# Patient Record
Sex: Female | Born: 1950
Health system: Southern US, Community
[De-identification: ages and names within clinical notes are randomized; demographics above are authoritative.]

## PROBLEM LIST (undated history)

## (undated) DIAGNOSIS — M8580 Other specified disorders of bone density and structure, unspecified site: Secondary | ICD-10-CM

## (undated) DIAGNOSIS — E785 Hyperlipidemia, unspecified: Secondary | ICD-10-CM

## (undated) DIAGNOSIS — I10 Essential (primary) hypertension: Secondary | ICD-10-CM

## (undated) HISTORY — PX: OTHER SURGICAL HISTORY: SHX169

## (undated) HISTORY — DX: Other specified disorders of bone density and structure, unspecified site: M85.80

## (undated) HISTORY — DX: Hyperlipidemia, unspecified: E78.5

## (undated) HISTORY — DX: Essential (primary) hypertension: I10

---

## 1997-05-18 ENCOUNTER — Ambulatory Visit (HOSPITAL_COMMUNITY): Admission: RE | Admit: 1997-05-18 | Discharge: 1997-05-18 | Payer: Self-pay | Admitting: *Deleted

## 1997-12-04 ENCOUNTER — Other Ambulatory Visit: Admission: RE | Admit: 1997-12-04 | Discharge: 1997-12-04 | Payer: Self-pay | Admitting: *Deleted

## 1998-02-02 ENCOUNTER — Ambulatory Visit (HOSPITAL_BASED_OUTPATIENT_CLINIC_OR_DEPARTMENT_OTHER): Admission: RE | Admit: 1998-02-02 | Discharge: 1998-02-02 | Payer: Self-pay | Admitting: Orthopedic Surgery

## 1998-05-18 ENCOUNTER — Encounter: Payer: Self-pay | Admitting: *Deleted

## 1998-05-18 ENCOUNTER — Ambulatory Visit (HOSPITAL_COMMUNITY): Admission: RE | Admit: 1998-05-18 | Discharge: 1998-05-18 | Payer: Self-pay | Admitting: *Deleted

## 1998-08-05 ENCOUNTER — Encounter: Payer: Self-pay | Admitting: General Surgery

## 1998-08-05 ENCOUNTER — Ambulatory Visit (HOSPITAL_COMMUNITY): Admission: RE | Admit: 1998-08-05 | Discharge: 1998-08-05 | Payer: Self-pay | Admitting: General Surgery

## 1998-12-28 ENCOUNTER — Other Ambulatory Visit: Admission: RE | Admit: 1998-12-28 | Discharge: 1998-12-28 | Payer: Self-pay | Admitting: *Deleted

## 1999-05-20 ENCOUNTER — Ambulatory Visit (HOSPITAL_COMMUNITY): Admission: RE | Admit: 1999-05-20 | Discharge: 1999-05-20 | Payer: Self-pay | Admitting: *Deleted

## 1999-05-20 ENCOUNTER — Encounter: Payer: Self-pay | Admitting: *Deleted

## 2000-01-24 ENCOUNTER — Other Ambulatory Visit: Admission: RE | Admit: 2000-01-24 | Discharge: 2000-01-24 | Payer: Self-pay | Admitting: *Deleted

## 2000-07-11 ENCOUNTER — Encounter: Payer: Self-pay | Admitting: *Deleted

## 2000-07-11 ENCOUNTER — Ambulatory Visit (HOSPITAL_COMMUNITY): Admission: RE | Admit: 2000-07-11 | Discharge: 2000-07-11 | Payer: Self-pay | Admitting: *Deleted

## 2001-07-05 ENCOUNTER — Other Ambulatory Visit: Admission: RE | Admit: 2001-07-05 | Discharge: 2001-07-05 | Payer: Self-pay | Admitting: *Deleted

## 2002-07-30 ENCOUNTER — Other Ambulatory Visit: Admission: RE | Admit: 2002-07-30 | Discharge: 2002-07-30 | Payer: Self-pay | Admitting: *Deleted

## 2002-08-27 ENCOUNTER — Encounter: Payer: Self-pay | Admitting: Endocrinology

## 2002-08-27 ENCOUNTER — Ambulatory Visit (HOSPITAL_COMMUNITY): Admission: RE | Admit: 2002-08-27 | Discharge: 2002-08-27 | Payer: Self-pay | Admitting: Endocrinology

## 2007-11-22 ENCOUNTER — Encounter: Admission: RE | Admit: 2007-11-22 | Discharge: 2007-11-22 | Payer: Self-pay | Admitting: Family Medicine

## 2008-12-31 ENCOUNTER — Ambulatory Visit: Payer: Self-pay | Admitting: Internal Medicine

## 2009-01-18 ENCOUNTER — Ambulatory Visit: Payer: Self-pay | Admitting: Internal Medicine

## 2009-02-15 ENCOUNTER — Ambulatory Visit: Payer: Self-pay | Admitting: Internal Medicine

## 2009-04-19 ENCOUNTER — Ambulatory Visit: Payer: Self-pay | Admitting: Internal Medicine

## 2009-06-21 ENCOUNTER — Ambulatory Visit: Payer: Self-pay | Admitting: Internal Medicine

## 2009-08-02 ENCOUNTER — Ambulatory Visit: Payer: Self-pay | Admitting: Internal Medicine

## 2009-09-06 ENCOUNTER — Ambulatory Visit: Payer: Self-pay | Admitting: Internal Medicine

## 2010-01-10 ENCOUNTER — Ambulatory Visit: Payer: Self-pay | Admitting: Internal Medicine

## 2010-02-17 ENCOUNTER — Ambulatory Visit: Payer: Self-pay | Admitting: Internal Medicine

## 2010-03-20 ENCOUNTER — Encounter: Payer: Self-pay | Admitting: Family Medicine

## 2010-04-15 ENCOUNTER — Encounter (INDEPENDENT_AMBULATORY_CARE_PROVIDER_SITE_OTHER): Payer: PRIVATE HEALTH INSURANCE | Admitting: Internal Medicine

## 2010-05-30 ENCOUNTER — Ambulatory Visit (INDEPENDENT_AMBULATORY_CARE_PROVIDER_SITE_OTHER): Payer: BC Managed Care – PPO | Admitting: Internal Medicine

## 2010-05-30 DIAGNOSIS — F988 Other specified behavioral and emotional disorders with onset usually occurring in childhood and adolescence: Secondary | ICD-10-CM

## 2010-05-30 DIAGNOSIS — E039 Hypothyroidism, unspecified: Secondary | ICD-10-CM

## 2010-05-30 DIAGNOSIS — I10 Essential (primary) hypertension: Secondary | ICD-10-CM

## 2010-07-08 ENCOUNTER — Telehealth: Payer: Self-pay | Admitting: Internal Medicine

## 2010-07-08 NOTE — Telephone Encounter (Signed)
Spoke with pt regarding rash.  Pt stated she has had symptoms off and on for approximately 1 week.  She stated she would continue with topical Benadryl for comfort.  Instructed pt to go to urgent care if symptoms worsen over the weekend and could call office Monday if appointment is still needed.  Pt verbalized understanding.

## 2010-07-08 NOTE — Telephone Encounter (Signed)
Needs to come in today or go to Urgent Care

## 2010-07-08 NOTE — Telephone Encounter (Signed)
MD agreed with advice and no further instructions provided.

## 2010-09-28 ENCOUNTER — Other Ambulatory Visit: Payer: Self-pay | Admitting: Internal Medicine

## 2010-10-02 ENCOUNTER — Other Ambulatory Visit: Payer: Self-pay | Admitting: Internal Medicine

## 2010-11-25 ENCOUNTER — Encounter: Payer: Self-pay | Admitting: Internal Medicine

## 2010-11-28 ENCOUNTER — Ambulatory Visit: Payer: BC Managed Care – PPO | Admitting: Internal Medicine

## 2010-12-03 ENCOUNTER — Other Ambulatory Visit: Payer: Self-pay | Admitting: Internal Medicine

## 2010-12-05 ENCOUNTER — Ambulatory Visit (INDEPENDENT_AMBULATORY_CARE_PROVIDER_SITE_OTHER): Payer: BC Managed Care – PPO | Admitting: Internal Medicine

## 2010-12-05 ENCOUNTER — Encounter: Payer: Self-pay | Admitting: Internal Medicine

## 2010-12-05 VITALS — BP 124/88 | HR 72 | Temp 98.5°F | Ht 64.0 in | Wt 136.0 lb

## 2010-12-05 DIAGNOSIS — F988 Other specified behavioral and emotional disorders with onset usually occurring in childhood and adolescence: Secondary | ICD-10-CM | POA: Insufficient documentation

## 2010-12-05 DIAGNOSIS — J069 Acute upper respiratory infection, unspecified: Secondary | ICD-10-CM

## 2010-12-05 DIAGNOSIS — I10 Essential (primary) hypertension: Secondary | ICD-10-CM | POA: Insufficient documentation

## 2010-12-05 DIAGNOSIS — E039 Hypothyroidism, unspecified: Secondary | ICD-10-CM

## 2010-12-05 NOTE — Progress Notes (Signed)
  Subjective:    Patient ID: Alexandra Taylor, female    DOB: [birth year], 60 y.o.   MRN: [id number]  HPI 60 year old white female in today for followup on hypothyroidism, hypertension, and attention deficit disorder. Has taken Adderall for many years previously through psychiatrist Dr. Jodi Marble. Currently takes Adderall 10 mg XR once daily sometimes twice daily. This works well for her. Dose of Synthroid is 0.75 mg daily. TSH drawn and is pending. Has had recent URI symptoms with cough which is dry. No fever or chills.    Review of Systems     Objective:   Physical Exam no thyromegaly, no adenopathy; chest clear; TMs are clear pharynx is clear; neck is supple        Assessment & Plan:  URI  Hypothyroidism  Attention deficit disorder  Hypertension  Okay to refill Synthroid for 6 months if pharmacy calls assuming TSH is within normal limits. New prescription for Adderall XR 10 mg #60 with directions one by mouth twice daily for attention deficit disorder. Given total of 2 written prescriptions. In 6 months she will have physical examination. She declines influenza immunization today

## 2011-01-30 ENCOUNTER — Other Ambulatory Visit: Payer: Self-pay | Admitting: Internal Medicine

## 2011-03-01 ENCOUNTER — Telehealth: Payer: Self-pay | Admitting: Internal Medicine

## 2011-03-01 DIAGNOSIS — F988 Other specified behavioral and emotional disorders with onset usually occurring in childhood and adolescence: Secondary | ICD-10-CM

## 2011-03-01 NOTE — Telephone Encounter (Signed)
RX written for 1/3/4 and 2/3/4 for Adderall XR 10 mg #60 1 po bid.  Pt called and advised scripts were ready to be picked up.

## 2011-03-28 ENCOUNTER — Other Ambulatory Visit: Payer: Self-pay | Admitting: Internal Medicine

## 2011-05-20 ENCOUNTER — Other Ambulatory Visit: Payer: Self-pay | Admitting: Internal Medicine

## 2011-05-30 ENCOUNTER — Other Ambulatory Visit: Payer: Self-pay | Admitting: Internal Medicine

## 2011-06-02 ENCOUNTER — Other Ambulatory Visit: Payer: BC Managed Care – PPO | Admitting: Internal Medicine

## 2011-06-05 ENCOUNTER — Encounter: Payer: BC Managed Care – PPO | Admitting: Internal Medicine

## 2011-06-26 ENCOUNTER — Encounter: Payer: Self-pay | Admitting: Internal Medicine

## 2011-06-26 ENCOUNTER — Ambulatory Visit (INDEPENDENT_AMBULATORY_CARE_PROVIDER_SITE_OTHER): Payer: BC Managed Care – PPO | Admitting: Internal Medicine

## 2011-06-26 DIAGNOSIS — B029 Zoster without complications: Secondary | ICD-10-CM

## 2011-06-26 NOTE — Patient Instructions (Signed)
Take Valtrex 1 g 3 times daily for 7 days. Apply triamcinolone cream to area of dermatitis 3 times daily.

## 2011-06-26 NOTE — Progress Notes (Signed)
  Subjective:    Patient ID: Alexandra Taylor, female    DOB: [birth year], 61 y.o.   MRN: [id number]  HPI 61 year old white female with 5 day history itchy lesion right trunk. Has been doing some yard work. Patient thought perhaps this was a spider bite. She did not have any painful podrome prior to onset of rash. It is been itchy. No fever or shaking chills. No other systemic symptoms. No nausea. Patient denies pain from the lesion. Not aware that anything bit her. Patient is planning a trip to [place] soon.    Review of Systems     Objective:   Physical Exam patient has area of linear erythema surrounding an excoriated lesion. There appears to be macular erythema with some fine papules surrounding the excoriation.. There is a similar area of erythema above this main area without excoriations but with macular erythema and some fine papules. These 2 areas follow a definite dermatatome along right lateral back into axillary line in the parathoracic area.        Assessment & Plan:  Probable Herpes zoster  Plan: Triamcinolone cream 0.1% 60 g to use on lesions 3 times a day for itching. Valtrex 1 g tablets (#21) 1 by mouth 3 times a day for 7 days. Even though she has had symptoms longer than 48 hours, I'm going to try Valtrex.

## 2011-06-27 ENCOUNTER — Ambulatory Visit: Payer: BC Managed Care – PPO | Admitting: Internal Medicine

## 2011-07-09 ENCOUNTER — Other Ambulatory Visit: Payer: Self-pay | Admitting: Internal Medicine

## 2011-07-31 ENCOUNTER — Other Ambulatory Visit: Payer: BC Managed Care – PPO | Admitting: Internal Medicine

## 2011-07-31 DIAGNOSIS — Z0000 Encounter for general adult medical examination without abnormal findings: Secondary | ICD-10-CM

## 2011-07-31 LAB — CBC WITH DIFFERENTIAL/PLATELET
Basophils Absolute: 0 10*3/uL (ref 0.0–0.1)
Basophils Relative: 1 % (ref 0–1)
Eosinophils Absolute: 0.2 10*3/uL (ref 0.0–0.7)
Eosinophils Relative: 4 % (ref 0–5)
MCH: 32.1 pg (ref 26.0–34.0)
MCHC: 34.8 g/dL (ref 30.0–36.0)
MCV: 92.4 fL (ref 78.0–100.0)
Neutrophils Relative %: 44 % (ref 43–77)
Platelets: 183 10*3/uL (ref 150–400)
RBC: 4.45 MIL/uL (ref 3.87–5.11)
RDW: 14.4 % (ref 11.5–15.5)

## 2011-07-31 LAB — LIPID PANEL
Cholesterol: 208 mg/dL — ABNORMAL HIGH (ref 0–200)
LDL Cholesterol: 72 mg/dL (ref 0–99)
Total CHOL/HDL Ratio: 1.6 Ratio
VLDL: 8 mg/dL (ref 0–40)

## 2011-07-31 LAB — COMPREHENSIVE METABOLIC PANEL
ALT: 15 U/L (ref 0–35)
AST: 27 U/L (ref 0–37)
Alkaline Phosphatase: 99 U/L (ref 39–117)
CO2: 28 mEq/L (ref 19–32)
Creat: 0.91 mg/dL (ref 0.50–1.10)
Sodium: 142 mEq/L (ref 135–145)
Total Bilirubin: 0.7 mg/dL (ref 0.3–1.2)
Total Protein: 6.7 g/dL (ref 6.0–8.3)

## 2011-08-01 ENCOUNTER — Encounter: Payer: Self-pay | Admitting: Internal Medicine

## 2011-08-01 ENCOUNTER — Ambulatory Visit (INDEPENDENT_AMBULATORY_CARE_PROVIDER_SITE_OTHER): Payer: BC Managed Care – PPO | Admitting: Internal Medicine

## 2011-08-01 VITALS — BP 130/74 | HR 72 | Ht 64.0 in | Wt 146.0 lb

## 2011-08-01 DIAGNOSIS — E039 Hypothyroidism, unspecified: Secondary | ICD-10-CM

## 2011-08-01 DIAGNOSIS — F988 Other specified behavioral and emotional disorders with onset usually occurring in childhood and adolescence: Secondary | ICD-10-CM

## 2011-08-01 DIAGNOSIS — I10 Essential (primary) hypertension: Secondary | ICD-10-CM

## 2011-08-01 LAB — POCT URINALYSIS DIPSTICK
Bilirubin, UA: NEGATIVE
Blood, UA: NEGATIVE
Nitrite, UA: NEGATIVE
Protein, UA: NEGATIVE
pH, UA: 6.5

## 2011-08-01 LAB — VITAMIN D 25 HYDROXY (VIT D DEFICIENCY, FRACTURES): Vit D, 25-Hydroxy: 46 ng/mL (ref 30–89)

## 2011-10-30 ENCOUNTER — Encounter: Payer: Self-pay | Admitting: Internal Medicine

## 2011-10-30 NOTE — Patient Instructions (Addendum)
Continue same medications and return in 6 months 

## 2011-10-30 NOTE — Progress Notes (Signed)
  Subjective:    Patient ID: Alexandra Taylor, female    DOB: 04-24-1950, 61 y.o.   MRN: [id number]  HPI 61 year old white female with history of hypertension, hypothyroidism, osteopenia and attention deficit disorder. Formerly seen by Dr. Jodi Marble treated with Adderall and Prozac. We started her on antihypertensive medication in November 2010. She was diagnosed with hyperthyroidism in 2004 and underwent a hemithyroidectomy resulting in hypothyroidism. GYN physician is Dr. Edward Jolly.  Bone density study in 2009. She currently takes Synthroid 0.075 mg daily along with vitamin D and calcium supplementation. Had colonoscopy 01/07/2007. Tetanus immunization November 2010.  Family history: Father died at age 89 with history of dementia. Mother living with history of hypertension, stroke and heart attack.. 7 brothers and 3 sisters. Social history: Patient is married enjoys sculpture and painting. Does not smoke. Social alcohol consumption daily consisting of wine and beer. Patient works as a Solicitor at [employer]. Husband is a self-employed Acupuncturist.    Review of Systems  Constitutional: Negative.   All other systems reviewed and are negative.       Objective:   Physical Exam  Constitutional: She is oriented to person, place, and time. She appears well-developed and well-nourished. No distress.  HENT:  Head: Normocephalic and atraumatic.  Right Ear: External ear normal.  Left Ear: External ear normal.  Mouth/Throat: Oropharynx is clear and moist. No oropharyngeal exudate.  Eyes: Conjunctivae and EOM are normal. Pupils are equal, round, and reactive to light. Right eye exhibits no discharge. Left eye exhibits no discharge. No scleral icterus.  Neck: Neck supple. No JVD present. No thyromegaly present.  Cardiovascular: Normal rate, regular rhythm, normal heart sounds and intact distal pulses.   No murmur heard. Pulmonary/Chest: Effort normal and breath sounds normal. No  respiratory distress. She has no wheezes. She has no rales.  Abdominal: Soft. Bowel sounds are normal. She exhibits no distension and no mass. There is no tenderness. There is no rebound and no guarding.  Genitourinary:       Deferred to GYN deferred to GYN  Musculoskeletal: She exhibits no edema.  Lymphadenopathy:    She has no cervical adenopathy.  Neurological: She is alert and oriented to person, place, and time. She has normal reflexes. No cranial nerve deficit. Coordination normal.  Skin: Skin is warm and dry. No rash noted. She is not diaphoretic.  Psychiatric: She has a normal mood and affect. Her behavior is normal. Judgment and thought content normal.          Assessment & Plan:  Hypertension  Hypothyroidism status post him he thyroidectomy for hyperthyroidism 2004  Attention deficit disorder treated with Prozac and Adderall  Plan: 3 written prescriptions for Adderall XR 10 mg daily #30 tablets he should dated for the next 3 months. Return in 6 months for office visit, followup on attention deficit,blood pressure check and  TSH

## 2012-01-23 ENCOUNTER — Other Ambulatory Visit: Payer: BC Managed Care – PPO | Admitting: Internal Medicine

## 2012-01-23 DIAGNOSIS — E039 Hypothyroidism, unspecified: Secondary | ICD-10-CM

## 2012-01-29 ENCOUNTER — Ambulatory Visit (INDEPENDENT_AMBULATORY_CARE_PROVIDER_SITE_OTHER): Payer: BC Managed Care – PPO | Admitting: Internal Medicine

## 2012-01-29 ENCOUNTER — Encounter: Payer: Self-pay | Admitting: Internal Medicine

## 2012-01-29 VITALS — BP 130/78 | HR 84 | Temp 98.9°F | Wt 145.5 lb

## 2012-01-29 DIAGNOSIS — Z23 Encounter for immunization: Secondary | ICD-10-CM

## 2012-01-29 DIAGNOSIS — F341 Dysthymic disorder: Secondary | ICD-10-CM

## 2012-01-29 DIAGNOSIS — F419 Anxiety disorder, unspecified: Secondary | ICD-10-CM

## 2012-01-29 DIAGNOSIS — E039 Hypothyroidism, unspecified: Secondary | ICD-10-CM

## 2012-01-29 DIAGNOSIS — I10 Essential (primary) hypertension: Secondary | ICD-10-CM

## 2012-01-29 DIAGNOSIS — F329 Major depressive disorder, single episode, unspecified: Secondary | ICD-10-CM

## 2012-01-29 NOTE — Progress Notes (Signed)
  Subjective:    Patient ID: Alexandra Taylor, female    DOB: [birth year], 61 y.o.   MRN: [id number]  HPI 61 year old white female in today for six-month recheck on hypertension and hypothyroidism. TSH recently drawn is within normal limits on current dose of Synthroid. Patient no longer taking Adderall. Feels she doesn't need it for attention deficit issues previously diagnosed by psychiatrist, Dr. Jodi Marble. Dr. Jodi Marble also had her on Prozac for a number of years. She tried stopping that abruptly recently and had significant discontinuation symptoms. Says she thinks she might like to get off of Prozac. Blood pressure is under good control on current regimen. No changes will be made with that medication.    Review of Systems     Objective:   Physical Exam Skin is warm and dry. Neck is supple without thyromegaly JVD or carotid bruits. Chest clear to auscultation. Cardiac exam regular rate and rhythm normal S1 and S2. Extremities without edema. Affect is normal.        Assessment & Plan:  Anxiety depression-treated for many years on Prozac  History of attention deficit disorder-patient has stopped Adderall and is doing well  Hypertension-stable on antihypertensive therapy- no change in regimen  Hypothyroidism-TSH normal continue same dose of Synthroid  Plan: If patient decides to discontinue Prozac she should do so slowly over several weeks. Spoke with her about tapering regimen today should she so desire to do this. Explained to her there was no harm in continuing Prozac since she had been on it for some time but she felt it helped her mood.

## 2012-01-29 NOTE — Patient Instructions (Addendum)
Continue thyroid replacement and antihypertensive therapy as previously prescribed. If you wish to stop Prozac he will need to taper it as directed over several weeks. Flu vaccine has been given today.

## 2012-02-08 ENCOUNTER — Other Ambulatory Visit: Payer: Self-pay | Admitting: Internal Medicine

## 2012-02-13 ENCOUNTER — Other Ambulatory Visit: Payer: Self-pay | Admitting: Internal Medicine

## 2012-02-14 ENCOUNTER — Other Ambulatory Visit: Payer: Self-pay

## 2012-03-13 ENCOUNTER — Other Ambulatory Visit: Payer: Self-pay

## 2012-03-13 MED ORDER — FLUOXETINE HCL 20 MG PO CAPS: 20.0000 mg | ORAL_CAPSULE | Freq: Every day | ORAL | Status: DC

## 2012-04-23 ENCOUNTER — Encounter: Payer: Self-pay | Admitting: Internal Medicine

## 2012-04-23 ENCOUNTER — Ambulatory Visit (INDEPENDENT_AMBULATORY_CARE_PROVIDER_SITE_OTHER): Payer: PRIVATE HEALTH INSURANCE | Admitting: Internal Medicine

## 2012-04-23 VITALS — BP 130/74 | Temp 99.5°F | Wt 147.0 lb

## 2012-04-23 DIAGNOSIS — J019 Acute sinusitis, unspecified: Secondary | ICD-10-CM

## 2012-04-23 DIAGNOSIS — R51 Headache: Secondary | ICD-10-CM

## 2012-04-23 NOTE — Patient Instructions (Addendum)
Take Levaquin 500 milligrams daily for 7 days for sinusitis. Take half of a hydrocodone tablet for pain with food. Call if not better in 48-72 hours or sooner if 4.

## 2012-04-23 NOTE — Progress Notes (Signed)
  Subjective:    Patient ID: Alexandra Taylor, female    DOB: 03/13/1950, 62 y.o.   MRN: [id number]  HPI 62 year old white female who recently had 4 wisdom teeth extracted on February 12. Shortly thereafter she came down with an upper respiratory infection which has been slow to resolve. Seems to be over the multiple tooth extractions. However today had onset of left maxillary pain and headache in the left periorbital area. Feels stuffy in her nose. No significant fever or shaking chills. No myalgias. No vomiting. Has remote history of migraine headache. Feels that this may be a sinus infection.    Review of Systems     Objective:   Physical Exam she is alert and oriented x3. Skin is warm and dry. PERRLA. Fundi are benign. Pharynx is slightly injected. TMs are clear. Neck is supple without significant adenopathy or thyromegaly. Chest clear to auscultation. No gross focal deficits on brief neurological exam        Assessment & Plan:  Sinusitis  Possible migraine headache  Plan: Patient has some hydrocodone at home left over from wisdom tooth extractions. She says it makes her "goofy". Suggested she take half tablet every 8 hours as needed for pain. Levaquin 500 milligrams daily for 7 days with a meal. Call if not better in 48 hours or sooner if worse.

## 2012-05-01 ENCOUNTER — Other Ambulatory Visit: Payer: Self-pay

## 2012-05-01 MED ORDER — FLUOXETINE HCL 20 MG PO CAPS: 20.0000 mg | ORAL_CAPSULE | Freq: Every day | ORAL | Status: DC

## 2012-05-01 MED ORDER — RAMIPRIL 10 MG PO CAPS: 10.0000 mg | ORAL_CAPSULE | Freq: Every day | ORAL | Status: DC

## 2012-05-02 ENCOUNTER — Other Ambulatory Visit: Payer: Self-pay

## 2012-05-02 MED ORDER — FLUOXETINE HCL 20 MG PO CAPS: 20.0000 mg | ORAL_CAPSULE | Freq: Every day | ORAL | Status: DC

## 2012-06-18 ENCOUNTER — Other Ambulatory Visit: Payer: Self-pay

## 2012-06-18 MED ORDER — SYNTHROID 75 MCG PO TABS: 75.0000 ug | ORAL_TABLET | Freq: Every day | ORAL | Status: DC

## 2012-08-06 ENCOUNTER — Other Ambulatory Visit: Payer: BC Managed Care – PPO | Admitting: Internal Medicine

## 2012-08-08 ENCOUNTER — Encounter: Payer: BC Managed Care – PPO | Admitting: Internal Medicine

## 2012-08-19 ENCOUNTER — Other Ambulatory Visit: Payer: PRIVATE HEALTH INSURANCE | Admitting: Internal Medicine

## 2012-08-19 DIAGNOSIS — Z13220 Encounter for screening for lipoid disorders: Secondary | ICD-10-CM

## 2012-08-19 DIAGNOSIS — Z130 Encounter for screening for diseases of the blood and blood-forming organs and certain disorders involving the immune mechanism: Secondary | ICD-10-CM

## 2012-08-19 DIAGNOSIS — Z13228 Encounter for screening for other metabolic disorders: Secondary | ICD-10-CM

## 2012-08-19 DIAGNOSIS — I10 Essential (primary) hypertension: Secondary | ICD-10-CM

## 2012-08-19 DIAGNOSIS — E039 Hypothyroidism, unspecified: Secondary | ICD-10-CM

## 2012-08-19 LAB — LIPID PANEL
HDL: 125 mg/dL (ref >39)
Triglycerides: 42 mg/dL (ref <150)

## 2012-08-19 LAB — CBC WITH DIFFERENTIAL/PLATELET
HCT: 39.8 % (ref 36.0–46.0)
Hemoglobin: 13.6 g/dL (ref 12.0–15.0)
Lymphocytes Relative: 45 % (ref 12–46)
MCHC: 34.2 g/dL (ref 30.0–36.0)
Monocytes Absolute: 0.4 10*3/uL (ref 0.1–1.0)
Monocytes Relative: 11 % (ref 3–12)
Neutro Abs: 1.5 10*3/uL — ABNORMAL LOW (ref 1.7–7.7)
WBC: 3.9 10*3/uL — ABNORMAL LOW (ref 4.0–10.5)

## 2012-08-19 LAB — COMPREHENSIVE METABOLIC PANEL
Albumin: 4.1 g/dL (ref 3.5–5.2)
BUN: 17 mg/dL (ref 6–23)
Calcium: 9.4 mg/dL (ref 8.4–10.5)
Chloride: 101 mEq/L (ref 96–112)
Glucose, Bld: 75 mg/dL (ref 70–99)
Potassium: 4.8 mEq/L (ref 3.5–5.3)

## 2012-08-20 ENCOUNTER — Other Ambulatory Visit (HOSPITAL_COMMUNITY)
Admission: RE | Admit: 2012-08-20 | Discharge: 2012-08-20 | Disposition: A | Discharge status: Home / Self Care | Payer: PRIVATE HEALTH INSURANCE | Source: Ambulatory Visit | Attending: Internal Medicine | Admitting: Internal Medicine

## 2012-08-20 ENCOUNTER — Encounter: Payer: Self-pay | Admitting: Internal Medicine

## 2012-08-20 ENCOUNTER — Ambulatory Visit (INDEPENDENT_AMBULATORY_CARE_PROVIDER_SITE_OTHER): Payer: PRIVATE HEALTH INSURANCE | Admitting: Internal Medicine

## 2012-08-20 VITALS — BP 134/84 | HR 80 | Ht 64.0 in | Wt 146.0 lb

## 2012-08-20 DIAGNOSIS — E039 Hypothyroidism, unspecified: Secondary | ICD-10-CM

## 2012-08-20 DIAGNOSIS — M8580 Other specified disorders of bone density and structure, unspecified site: Secondary | ICD-10-CM

## 2012-08-20 DIAGNOSIS — F411 Generalized anxiety disorder: Secondary | ICD-10-CM

## 2012-08-20 DIAGNOSIS — Z01419 Encounter for gynecological examination (general) (routine) without abnormal findings: Secondary | ICD-10-CM | POA: Insufficient documentation

## 2012-08-20 DIAGNOSIS — M899 Disorder of bone, unspecified: Secondary | ICD-10-CM

## 2012-08-20 DIAGNOSIS — Z0000 Encounter for general adult medical examination without abnormal findings: Secondary | ICD-10-CM

## 2012-08-20 DIAGNOSIS — F988 Other specified behavioral and emotional disorders with onset usually occurring in childhood and adolescence: Secondary | ICD-10-CM

## 2012-08-20 DIAGNOSIS — I10 Essential (primary) hypertension: Secondary | ICD-10-CM

## 2012-08-20 MED ORDER — FLUOXETINE HCL 40 MG PO CAPS: 40.0000 mg | ORAL_CAPSULE | Freq: Every day | ORAL | Status: DC

## 2012-08-20 MED ORDER — SYNTHROID 75 MCG PO TABS: 75.0000 ug | ORAL_TABLET | Freq: Every day | ORAL | Status: DC

## 2012-08-20 MED ORDER — RAMIPRIL 10 MG PO CAPS: 10.0000 mg | ORAL_CAPSULE | Freq: Every day | ORAL | Status: DC

## 2012-08-29 ENCOUNTER — Other Ambulatory Visit: Payer: Self-pay | Admitting: Internal Medicine

## 2012-09-08 ENCOUNTER — Encounter: Payer: Self-pay | Admitting: Internal Medicine

## 2012-12-23 ENCOUNTER — Other Ambulatory Visit: Payer: PRIVATE HEALTH INSURANCE | Admitting: Internal Medicine

## 2012-12-23 DIAGNOSIS — E039 Hypothyroidism, unspecified: Secondary | ICD-10-CM

## 2012-12-24 ENCOUNTER — Ambulatory Visit (INDEPENDENT_AMBULATORY_CARE_PROVIDER_SITE_OTHER): Payer: PRIVATE HEALTH INSURANCE | Admitting: Internal Medicine

## 2012-12-24 ENCOUNTER — Encounter: Payer: Self-pay | Admitting: Internal Medicine

## 2012-12-24 VITALS — BP 124/78 | HR 68 | Temp 97.9°F | Ht 64.0 in | Wt 143.0 lb

## 2012-12-24 DIAGNOSIS — F329 Major depressive disorder, single episode, unspecified: Secondary | ICD-10-CM

## 2012-12-24 DIAGNOSIS — F341 Dysthymic disorder: Secondary | ICD-10-CM

## 2012-12-24 DIAGNOSIS — F32A Depression, unspecified: Secondary | ICD-10-CM

## 2012-12-24 DIAGNOSIS — E039 Hypothyroidism, unspecified: Secondary | ICD-10-CM

## 2012-12-24 DIAGNOSIS — Z23 Encounter for immunization: Secondary | ICD-10-CM

## 2012-12-24 DIAGNOSIS — F419 Anxiety disorder, unspecified: Secondary | ICD-10-CM

## 2012-12-24 DIAGNOSIS — I10 Essential (primary) hypertension: Secondary | ICD-10-CM

## 2012-12-24 DIAGNOSIS — Z0000 Encounter for general adult medical examination without abnormal findings: Secondary | ICD-10-CM

## 2012-12-24 MED ORDER — LEVOTHYROXINE SODIUM 50 MCG PO TABS: 50.0000 ug | ORAL_TABLET | Freq: Every day | ORAL | Status: DC

## 2012-12-24 NOTE — Patient Instructions (Addendum)
Alternate Synthroid to 0.05 mg every other day with 0.075 mg Synthroid every other day until dosage of 0.075 has been depleted. Then begin Synthroid 0.05 mg daily until rechecked early July 2015. Continuing antihypertensive medication and Prozac. Flu vaccine given today.

## 2012-12-24 NOTE — Addendum Note (Signed)
Addended by: Fayne Mediate on: 12/24/2012 12:54 PM   Modules accepted: Orders

## 2012-12-24 NOTE — Progress Notes (Signed)
  Subjective:    Patient ID: Alexandra Taylor, female    DOB: 04-May-1950, 62 y.o.   MRN: [id number]  HPI in today for six-month recheck of hypothyroidism. TSH is low. It was low the last time she was here is well. She currently is on Synthroid 0.075 mg daily non-generic. She has a history of hypertension and is on Ramipril 10 mg daily. History of anxiety depression and has taken Prozac for a long time. Seems to be doing well. Feels okay. Influenza vaccine given today.    Review of Systems     Objective:   Physical Exam neck is supple without thyromegaly. Chest clear to auscultation. Cardiac exam regular rate and rhythm. Extremities without edema. Skin is warm and dry. Affect is normal.        Assessment & Plan:  History of anxiety depression-stable on Prozac  Hypertension-stable on Ramipril 10 mg daily  Hypothyroidism-TSH indicates that dosage of thyroid replacement needs to be reduced. Patient has just purchased 90 days of Synthroid 0.075 mg daily. She will alternate that with 0.05 mg daily for the next 90 days and then stay entirely on Synthroid 0.05 mg daily until she returns late June or early July for physical examination.  Flu vaccine given today.

## 2013-01-25 ENCOUNTER — Encounter: Payer: Self-pay | Admitting: Internal Medicine

## 2013-01-25 NOTE — Progress Notes (Signed)
   Subjective:    Patient ID: Alexandra Taylor, female    DOB: 06/11/1950, 63 y.o.   MRN: [id number]  HPI 62 year old white female in today for health maintenance and evaluation of medical issues. She has a history of hypothyroidism. History of hypertension, osteopenia, and attention deficit disorder. Formerly seen by Dr. Daisy Floro we treated with Adderall and Prozac. She was started here on antihypertensive medication November 2010. Was diagnosed with hyperthyroidism in 2004 and underwent a hemithyroidectomy resulting in hypothyroidism. GYN physician moved away. She would like to get Pap smear and pelvic exam here today. Had colonoscopy in 2008. Tetanus immunization 2010.  Social history: Patient is married enjoys sculpturing paining. Does not smoke. Social alcohol consumption daily consisting of beer and wine. Patient works as a Solicitor at [employer]. Husband is a self-employed Acupuncturist.  Family history: Father died at age 34 with history of dementia. Mother living with history of hypertension, stroke, heart attack. 7 brothers and 3 sisters.    Review of Systems  Constitutional: Negative.   All other systems reviewed and are negative.       Objective:   Physical Exam  Vitals reviewed. Constitutional: She is oriented to person, place, and time. She appears well-developed and well-nourished. No distress.  HENT:  Head: Normocephalic and atraumatic.  Right Ear: External ear normal.  Left Ear: External ear normal.  Mouth/Throat: Oropharynx is clear and moist. No oropharyngeal exudate.  Eyes: Right eye exhibits no discharge. Left eye exhibits no discharge.  Neck: Neck supple. No JVD present. No thyromegaly present.  Cardiovascular: Normal rate, regular rhythm and normal heart sounds.   No murmur heard. Pulmonary/Chest: Effort normal and breath sounds normal. No respiratory distress. She has no wheezes. She has no rales.  Abdominal: Soft. Bowel sounds are normal. She  exhibits no distension and no mass. There is no tenderness. There is no rebound and no guarding.  Genitourinary:  Pap taken. Bimanual normal.  Musculoskeletal: Normal range of motion. She exhibits no edema.  Lymphadenopathy:    She has no cervical adenopathy.  Neurological: She is alert and oriented to person, place, and time. She has normal reflexes. Coordination normal.  Skin: Skin is warm and dry. No rash noted. She is not diaphoretic.  Psychiatric: She has a normal mood and affect. Her behavior is normal. Judgment and thought content normal.          Assessment & Plan:  Hypertension-stable  Surgical hypothyroidism status post hemithyroidectomy in 2004-TSH low on Synthroid. Could be an error. Recheck in 6-8 weeks.  Osteopenia-recommend bone density study  History of attention deficit disorder- No longer takes Adderall.  Anxiety-treated with Prozac 40 mg daily  Plan: Return in 6 months or as needed. Recommend annual flu vaccine. Recommend annual mammogram. Bone density study every 3 years.

## 2013-01-26 NOTE — Patient Instructions (Signed)
Recheck TSH in 6 weeks. Otherwise return in 6 months.

## 2013-01-29 ENCOUNTER — Other Ambulatory Visit: Payer: Self-pay | Admitting: Internal Medicine

## 2013-04-23 ENCOUNTER — Other Ambulatory Visit: Payer: Self-pay | Admitting: Internal Medicine

## 2013-06-30 ENCOUNTER — Other Ambulatory Visit: Payer: Self-pay | Admitting: Internal Medicine

## 2013-08-21 ENCOUNTER — Other Ambulatory Visit: Payer: PRIVATE HEALTH INSURANCE | Admitting: Internal Medicine

## 2013-08-21 ENCOUNTER — Other Ambulatory Visit: Payer: Self-pay | Admitting: Internal Medicine

## 2013-08-21 DIAGNOSIS — Z13220 Encounter for screening for lipoid disorders: Secondary | ICD-10-CM

## 2013-08-21 DIAGNOSIS — Z1329 Encounter for screening for other suspected endocrine disorder: Secondary | ICD-10-CM

## 2013-08-21 DIAGNOSIS — Z130 Encounter for screening for diseases of the blood and blood-forming organs and certain disorders involving the immune mechanism: Secondary | ICD-10-CM

## 2013-08-21 DIAGNOSIS — E039 Hypothyroidism, unspecified: Secondary | ICD-10-CM

## 2013-08-21 DIAGNOSIS — I10 Essential (primary) hypertension: Secondary | ICD-10-CM

## 2013-08-21 DIAGNOSIS — Z13228 Encounter for screening for other metabolic disorders: Secondary | ICD-10-CM

## 2013-08-21 LAB — LIPID PANEL
Cholesterol: 222 mg/dL — ABNORMAL HIGH (ref 0–200)
HDL: 126 mg/dL (ref >39)
LDL Cholesterol: 87 mg/dL (ref 0–99)
TRIGLYCERIDES: 45 mg/dL (ref <150)
Total CHOL/HDL Ratio: 1.8 Ratio
VLDL: 9 mg/dL (ref 0–40)

## 2013-08-21 LAB — CBC WITH DIFFERENTIAL/PLATELET
BASOS ABS: 0 10*3/uL (ref 0.0–0.1)
BASOS PCT: 1 % (ref 0–1)
EOS PCT: 4 % (ref 0–5)
Eosinophils Absolute: 0.1 10*3/uL (ref 0.0–0.7)
HCT: 38.3 % (ref 36.0–46.0)
Hemoglobin: 12.9 g/dL (ref 12.0–15.0)
LYMPHS PCT: 45 % (ref 12–46)
Lymphs Abs: 1.2 10*3/uL (ref 0.7–4.0)
MCH: 32.3 pg (ref 26.0–34.0)
MCHC: 33.7 g/dL (ref 30.0–36.0)
MCV: 95.8 fL (ref 78.0–100.0)
Monocytes Absolute: 0.3 10*3/uL (ref 0.1–1.0)
Monocytes Relative: 10 % (ref 3–12)
Neutro Abs: 1.1 10*3/uL — ABNORMAL LOW (ref 1.7–7.7)
Neutrophils Relative %: 40 % — ABNORMAL LOW (ref 43–77)
PLATELETS: 168 10*3/uL (ref 150–400)
RBC: 4 MIL/uL (ref 3.87–5.11)
RDW: 14.4 % (ref 11.5–15.5)
WBC: 2.7 10*3/uL — AB (ref 4.0–10.5)

## 2013-08-21 LAB — COMPREHENSIVE METABOLIC PANEL
ALBUMIN: 4.2 g/dL (ref 3.5–5.2)
ALT: 12 U/L (ref 0–35)
AST: 25 U/L (ref 0–37)
Alkaline Phosphatase: 88 U/L (ref 39–117)
BILIRUBIN TOTAL: 0.4 mg/dL (ref 0.2–1.2)
BUN: 12 mg/dL (ref 6–23)
CO2: 27 mEq/L (ref 19–32)
Calcium: 9.1 mg/dL (ref 8.4–10.5)
Chloride: 105 mEq/L (ref 96–112)
Creat: 0.84 mg/dL (ref 0.50–1.10)
GLUCOSE: 75 mg/dL (ref 70–99)
POTASSIUM: 5.1 meq/L (ref 3.5–5.3)
Sodium: 142 mEq/L (ref 135–145)
Total Protein: 6.6 g/dL (ref 6.0–8.3)

## 2013-08-22 ENCOUNTER — Ambulatory Visit (INDEPENDENT_AMBULATORY_CARE_PROVIDER_SITE_OTHER): Payer: PRIVATE HEALTH INSURANCE | Admitting: Internal Medicine

## 2013-08-22 ENCOUNTER — Encounter: Payer: Self-pay | Admitting: Internal Medicine

## 2013-08-22 VITALS — BP 120/68 | HR 64 | Temp 98.5°F | Ht 64.0 in | Wt 146.0 lb

## 2013-08-22 DIAGNOSIS — I10 Essential (primary) hypertension: Secondary | ICD-10-CM

## 2013-08-22 DIAGNOSIS — M899 Disorder of bone, unspecified: Secondary | ICD-10-CM

## 2013-08-22 DIAGNOSIS — M949 Disorder of cartilage, unspecified: Secondary | ICD-10-CM

## 2013-08-22 DIAGNOSIS — M79609 Pain in unspecified limb: Secondary | ICD-10-CM

## 2013-08-22 DIAGNOSIS — F988 Other specified behavioral and emotional disorders with onset usually occurring in childhood and adolescence: Secondary | ICD-10-CM

## 2013-08-22 DIAGNOSIS — W57XXXA Bitten or stung by nonvenomous insect and other nonvenomous arthropods, initial encounter: Secondary | ICD-10-CM

## 2013-08-22 DIAGNOSIS — Z1239 Encounter for other screening for malignant neoplasm of breast: Secondary | ICD-10-CM

## 2013-08-22 DIAGNOSIS — M79644 Pain in right finger(s): Secondary | ICD-10-CM

## 2013-08-22 DIAGNOSIS — Z8739 Personal history of other diseases of the musculoskeletal system and connective tissue: Secondary | ICD-10-CM

## 2013-08-22 DIAGNOSIS — E039 Hypothyroidism, unspecified: Secondary | ICD-10-CM

## 2013-08-22 DIAGNOSIS — D709 Neutropenia, unspecified: Secondary | ICD-10-CM

## 2013-08-22 DIAGNOSIS — Z0000 Encounter for general adult medical examination without abnormal findings: Secondary | ICD-10-CM

## 2013-08-22 DIAGNOSIS — F341 Dysthymic disorder: Secondary | ICD-10-CM

## 2013-08-22 DIAGNOSIS — M8580 Other specified disorders of bone density and structure, unspecified site: Secondary | ICD-10-CM

## 2013-08-22 LAB — URIC ACID: URIC ACID, SERUM: 5.1 mg/dL (ref 2.4–7.0)

## 2013-08-22 LAB — VITAMIN D 25 HYDROXY (VIT D DEFICIENCY, FRACTURES): Vit D, 25-Hydroxy: 48 ng/mL (ref 30–89)

## 2013-08-22 LAB — SEDIMENTATION RATE: SED RATE: 5 mm/h (ref 0–22)

## 2013-08-22 LAB — TSH: TSH: 1.048 u[IU]/mL (ref 0.350–4.500)

## 2013-08-23 NOTE — Patient Instructions (Addendum)
We will obtain rheumatology consultation regarding your hand issues. Continue same medications and return in 3 months for repeat CBC since white blood cell count is low. Continue thyroid replacement. Continue Prozac. Please have mammogram and bone density study.

## 2013-08-23 NOTE — Progress Notes (Signed)
Subjective:    Patient ID: Alexandra Taylor, female    DOB: 07-Nov-1950, 63 y.o.   MRN: [id number]  HPI 63 year old White Female in today for health maintenance and evaluation of medical issues. Having issues with right index and third finger right hand with stiffness and distortion of joints. Says father had similar problems. She has a history of hypothyroidism, hypertension, osteopenia, attention deficit disorder. Currently not on attention deficit medication. She was diagnosed with hyperthyroidism in 2004 and underwent a him he thyroidectomy resulting in hypothyroidism. Was started on antihypertensive medication November 2010. Has taken Adderall in the past for attention deficit. Is on Prozac for dysthymic disorder/depression.  Colonoscopy in 2008. Tetanus immunization 2010.  Social history: Patient is married and enjoys sculpturing and painting. She does not smoke. Social alcohol consumption consisting of beer and wine. She works as a Scientist, clinical (histocompatibility and immunogenetics) at [employer]. Husband is a self-employed Chartered certified accountant.  Family history: Mother in her 15s with history of hypertension, stroke, MI, fractured hip and recent diagnosis of anemia. Father died at age 51 with history of dementia and had arthritis of his hands.    Review of Systems  HENT: Negative.   Respiratory: Negative.   Cardiovascular: Negative.   Gastrointestinal: Negative.   Musculoskeletal:       Some stiffness right hand second and third fingers       Objective:   Physical Exam  Vitals reviewed. Constitutional: She is oriented to person, place, and time. She appears well-developed and well-nourished. No distress.  HENT:  Head: Normocephalic and atraumatic.  Right Ear: External ear normal.  Left Ear: External ear normal.  Mouth/Throat: Oropharynx is clear and moist. No oropharyngeal exudate.  Eyes: Conjunctivae and EOM are normal. Pupils are equal, round, and reactive to light. Right eye exhibits no discharge. Left  eye exhibits no discharge. No scleral icterus.  Neck: Neck supple. No JVD present.  Cardiovascular: Normal rate, regular rhythm, normal heart sounds and intact distal pulses.   No murmur heard. Pulmonary/Chest: Effort normal and breath sounds normal. No respiratory distress. She has no wheezes. She has no rales. She exhibits no tenderness.  Breasts normal female  Abdominal: Soft. Bowel sounds are normal. She exhibits no distension and no mass. There is no tenderness. There is no rebound and no guarding.  Genitourinary:  Deferred  Musculoskeletal: She exhibits no edema.  Have her teens and Bouchard's nodes right second and third fingers. No redness increased warmth or tenderness of these joints  Lymphadenopathy:    She has no cervical adenopathy.  Neurological: She is alert and oriented to person, place, and time. She has normal reflexes. No cranial nerve deficit. Coordination normal.  Skin: Skin is warm and dry. No rash noted. She is not diaphoretic.  4 resolving insect bites left upper shoulder  Psychiatric: She has a normal mood and affect. Her behavior is normal. Judgment and thought content normal.          Assessment & Plan:  Hypothyroidism-TSH is stable on thyroid replacement  Neutropenia-new diagnosis. White blood cell count is decreased. Return in 3 months for repeat CBC  Hypertension-stable on ACE inhibitor.  History of attention deficit- no longer takes Adderall  History of dysthymic disorder-continues to take Prozac  Heberden's and Bouchard's nodes right hand with significant distortion. Consider gout or rheumatoid arthritis. Have added uric acid, sed rate, CCP to present labs. Rheumatology consultation.  New insect bites left upper shoulder x4. Patient to apply over-the-counter cortisone 1% cream 3 times  daily  Recommend annual mammogram. Order placed in Epic. She's not had a recent mammogram and was encouraged to do so.History of osteopenia. Should have bone density  study also. Return in one year or as needed. Continue to monitor blood pressure and time.

## 2013-08-25 LAB — CYCLIC CITRUL PEPTIDE ANTIBODY, IGG: Cyclic Citrullin Peptide Ab: <2 U/mL (ref 0.0–5.0)

## 2013-08-25 NOTE — Progress Notes (Signed)
Patient informed. 

## 2013-08-26 ENCOUNTER — Telehealth: Payer: Self-pay

## 2013-08-28 ENCOUNTER — Telehealth: Payer: Self-pay | Admitting: Internal Medicine

## 2013-08-28 ENCOUNTER — Other Ambulatory Visit: Payer: Self-pay

## 2013-08-28 MED ORDER — TRIAMCINOLONE ACETONIDE 0.1 % EX CREA: 1.0000 "application " | TOPICAL_CREAM | Freq: Three times a day (TID) | CUTANEOUS | Status: DC

## 2013-08-28 NOTE — Telephone Encounter (Signed)
Patient informed. 

## 2013-08-28 NOTE — Telephone Encounter (Signed)
She declined meds at time of visit for insect stings. Call in Triamcinolone cream 0.1% tid to sting area 30 grams.

## 2013-08-28 NOTE — Telephone Encounter (Signed)
Pt called, was stung by bee on back of left hand Tuesday, 08/26/2013.  Hand is still swollen, now warm to the touch.  She said was just seen on 08/22/13 and would like to have medication called in to [facility] if possible.  Please advise.  Best number to call is [phone]

## 2013-09-05 ENCOUNTER — Other Ambulatory Visit: Payer: PRIVATE HEALTH INSURANCE

## 2013-09-05 ENCOUNTER — Ambulatory Visit: Payer: PRIVATE HEALTH INSURANCE

## 2013-09-10 ENCOUNTER — Ambulatory Visit
Admission: RE | Admit: 2013-09-10 | Discharge: 2013-09-10 | Disposition: A | Discharge status: Home / Self Care | Payer: PRIVATE HEALTH INSURANCE | Source: Ambulatory Visit | Attending: Internal Medicine | Admitting: Internal Medicine

## 2013-09-10 DIAGNOSIS — M8580 Other specified disorders of bone density and structure, unspecified site: Secondary | ICD-10-CM

## 2013-09-10 DIAGNOSIS — Z1239 Encounter for other screening for malignant neoplasm of breast: Secondary | ICD-10-CM

## 2013-09-11 NOTE — Progress Notes (Signed)
Patient informed. She also has an appointment with Dr. Amil Amen, rheumatologist.

## 2013-09-20 ENCOUNTER — Other Ambulatory Visit: Payer: Self-pay | Admitting: Internal Medicine

## 2013-10-29 ENCOUNTER — Other Ambulatory Visit: Payer: Self-pay | Admitting: Internal Medicine

## 2013-11-25 ENCOUNTER — Other Ambulatory Visit: Payer: PRIVATE HEALTH INSURANCE | Admitting: Internal Medicine

## 2013-11-25 DIAGNOSIS — D709 Neutropenia, unspecified: Secondary | ICD-10-CM

## 2013-11-25 LAB — CBC WITH DIFFERENTIAL/PLATELET
Basophils Absolute: 0 10*3/uL (ref 0.0–0.1)
Basophils Relative: 1 % (ref 0–1)
EOS ABS: 0.2 10*3/uL (ref 0.0–0.7)
Eosinophils Relative: 4 % (ref 0–5)
HCT: 41.2 % (ref 36.0–46.0)
HEMOGLOBIN: 13.9 g/dL (ref 12.0–15.0)
Lymphocytes Relative: 40 % (ref 12–46)
Lymphs Abs: 1.6 10*3/uL (ref 0.7–4.0)
MCH: 32.5 pg (ref 26.0–34.0)
MCHC: 33.7 g/dL (ref 30.0–36.0)
MCV: 96.3 fL (ref 78.0–100.0)
MONOS PCT: 12 % (ref 3–12)
Monocytes Absolute: 0.5 10*3/uL (ref 0.1–1.0)
Neutro Abs: 1.7 10*3/uL (ref 1.7–7.7)
Neutrophils Relative %: 43 % (ref 43–77)
Platelets: 179 10*3/uL (ref 150–400)
RBC: 4.28 MIL/uL (ref 3.87–5.11)
RDW: 15.2 % (ref 11.5–15.5)
WBC: 3.9 10*3/uL — AB (ref 4.0–10.5)

## 2013-11-26 ENCOUNTER — Telehealth: Payer: Self-pay

## 2013-11-26 NOTE — Telephone Encounter (Signed)
Left message informing patient of normal CBC results.

## 2014-01-04 ENCOUNTER — Other Ambulatory Visit: Payer: Self-pay | Admitting: Internal Medicine

## 2014-03-22 ENCOUNTER — Other Ambulatory Visit: Payer: Self-pay | Admitting: Internal Medicine

## 2014-08-12 ENCOUNTER — Telehealth: Payer: Self-pay | Admitting: Internal Medicine

## 2014-08-12 NOTE — Telephone Encounter (Signed)
Patient calls for refill Rx on her Ramipril 10mg .  She would like a #90 through her mail order [facility].    Thanks!

## 2014-08-13 NOTE — Telephone Encounter (Signed)
Denied needs OV

## 2014-09-10 ENCOUNTER — Other Ambulatory Visit: Payer: Self-pay | Admitting: Internal Medicine

## 2014-09-28 ENCOUNTER — Other Ambulatory Visit: Payer: Self-pay | Admitting: Internal Medicine

## 2014-09-28 ENCOUNTER — Other Ambulatory Visit: Payer: PRIVATE HEALTH INSURANCE | Admitting: Internal Medicine

## 2014-09-28 DIAGNOSIS — Z1321 Encounter for screening for nutritional disorder: Secondary | ICD-10-CM

## 2014-09-28 DIAGNOSIS — Z130 Encounter for screening for diseases of the blood and blood-forming organs and certain disorders involving the immune mechanism: Secondary | ICD-10-CM

## 2014-09-28 DIAGNOSIS — Z0000 Encounter for general adult medical examination without abnormal findings: Secondary | ICD-10-CM

## 2014-09-28 DIAGNOSIS — Z13220 Encounter for screening for lipoid disorders: Secondary | ICD-10-CM

## 2014-09-28 DIAGNOSIS — Z1329 Encounter for screening for other suspected endocrine disorder: Secondary | ICD-10-CM

## 2014-09-28 LAB — COMPLETE METABOLIC PANEL WITH GFR
ALT: 15 U/L (ref 6–29)
AST: 28 U/L (ref 10–35)
Albumin: 4.2 g/dL (ref 3.6–5.1)
Alkaline Phosphatase: 87 U/L (ref 33–130)
BUN: 18 mg/dL (ref 7–25)
CO2: 26 mmol/L (ref 20–31)
CREATININE: 0.86 mg/dL (ref 0.50–0.99)
Calcium: 9.4 mg/dL (ref 8.6–10.4)
Chloride: 104 mmol/L (ref 98–110)
GFR, EST NON AFRICAN AMERICAN: 72 mL/min (ref >=60)
GFR, Est African American: 83 mL/min (ref >=60)
GLUCOSE: 78 mg/dL (ref 65–99)
Potassium: 4.9 mmol/L (ref 3.5–5.3)
Sodium: 140 mmol/L (ref 135–146)
TOTAL PROTEIN: 6.8 g/dL (ref 6.1–8.1)
Total Bilirubin: 0.9 mg/dL (ref 0.2–1.2)

## 2014-09-28 LAB — CBC WITH DIFFERENTIAL/PLATELET
BASOS PCT: 1 % (ref 0–1)
Basophils Absolute: 0 10*3/uL (ref 0.0–0.1)
EOS PCT: 4 % (ref 0–5)
Eosinophils Absolute: 0.2 10*3/uL (ref 0.0–0.7)
HEMATOCRIT: 41.1 % (ref 36.0–46.0)
Hemoglobin: 13.9 g/dL (ref 12.0–15.0)
LYMPHS ABS: 1.5 10*3/uL (ref 0.7–4.0)
Lymphocytes Relative: 40 % (ref 12–46)
MCH: 32.7 pg (ref 26.0–34.0)
MCHC: 33.8 g/dL (ref 30.0–36.0)
MCV: 96.7 fL (ref 78.0–100.0)
MONO ABS: 0.3 10*3/uL (ref 0.1–1.0)
MPV: 9.6 fL (ref 8.6–12.4)
Monocytes Relative: 8 % (ref 3–12)
NEUTROS PCT: 47 % (ref 43–77)
Neutro Abs: 1.8 10*3/uL (ref 1.7–7.7)
Platelets: 184 10*3/uL (ref 150–400)
RBC: 4.25 MIL/uL (ref 3.87–5.11)
RDW: 14.8 % (ref 11.5–15.5)
WBC: 3.8 10*3/uL — AB (ref 4.0–10.5)

## 2014-09-28 LAB — LIPID PANEL
CHOLESTEROL: 228 mg/dL — AB (ref 125–200)
HDL: 158 mg/dL (ref >=46)
LDL Cholesterol: 62 mg/dL (ref <130)
Total CHOL/HDL Ratio: 1.4 Ratio (ref <=5.0)
Triglycerides: 38 mg/dL (ref <150)
VLDL: 8 mg/dL (ref <30)

## 2014-09-28 LAB — TSH: TSH: 1.535 u[IU]/mL (ref 0.350–4.500)

## 2014-09-28 NOTE — Telephone Encounter (Signed)
Awaiting lab results before refill

## 2014-09-29 ENCOUNTER — Encounter: Payer: Self-pay | Admitting: Internal Medicine

## 2014-09-29 ENCOUNTER — Ambulatory Visit (INDEPENDENT_AMBULATORY_CARE_PROVIDER_SITE_OTHER): Payer: PRIVATE HEALTH INSURANCE | Admitting: Internal Medicine

## 2014-09-29 VITALS — BP 122/74 | HR 73 | Temp 98.2°F | Ht 65.0 in | Wt 146.0 lb

## 2014-09-29 DIAGNOSIS — M19041 Primary osteoarthritis, right hand: Secondary | ICD-10-CM | POA: Diagnosis not present

## 2014-09-29 DIAGNOSIS — Z0000 Encounter for general adult medical examination without abnormal findings: Secondary | ICD-10-CM

## 2014-09-29 DIAGNOSIS — M8580 Other specified disorders of bone density and structure, unspecified site: Secondary | ICD-10-CM | POA: Diagnosis not present

## 2014-09-29 DIAGNOSIS — R7989 Other specified abnormal findings of blood chemistry: Secondary | ICD-10-CM

## 2014-09-29 DIAGNOSIS — I10 Essential (primary) hypertension: Secondary | ICD-10-CM | POA: Diagnosis not present

## 2014-09-29 DIAGNOSIS — Z8659 Personal history of other mental and behavioral disorders: Secondary | ICD-10-CM | POA: Diagnosis not present

## 2014-09-29 DIAGNOSIS — E039 Hypothyroidism, unspecified: Secondary | ICD-10-CM | POA: Diagnosis not present

## 2014-09-29 DIAGNOSIS — F909 Attention-deficit hyperactivity disorder, unspecified type: Secondary | ICD-10-CM | POA: Diagnosis not present

## 2014-09-29 DIAGNOSIS — F988 Other specified behavioral and emotional disorders with onset usually occurring in childhood and adolescence: Secondary | ICD-10-CM

## 2014-09-29 LAB — VITAMIN D 25 HYDROXY (VIT D DEFICIENCY, FRACTURES): Vit D, 25-Hydroxy: 34 ng/mL (ref 30–100)

## 2014-09-29 LAB — POCT URINALYSIS DIPSTICK
Bilirubin, UA: NEGATIVE
Blood, UA: NEGATIVE
Glucose, UA: NEGATIVE
Ketones, UA: NEGATIVE
Leukocytes, UA: NEGATIVE
NITRITE UA: NEGATIVE
PH UA: 5
Protein, UA: NEGATIVE
SPEC GRAV UA: 1.02
UROBILINOGEN UA: NEGATIVE

## 2014-09-29 MED ORDER — FLUOXETINE HCL 40 MG PO CAPS: 40.0000 mg | ORAL_CAPSULE | Freq: Every day | ORAL | Status: DC

## 2014-09-29 MED ORDER — RAMIPRIL 10 MG PO CAPS: 10.0000 mg | ORAL_CAPSULE | Freq: Every day | ORAL | Status: DC

## 2014-09-29 NOTE — Patient Instructions (Signed)
It was a pleasure to see today. Continue same medications and return in one year.

## 2014-10-05 ENCOUNTER — Other Ambulatory Visit: Payer: Self-pay | Admitting: Internal Medicine

## 2014-10-26 NOTE — Progress Notes (Signed)
   Subjective:    Patient ID: Alexandra Taylor, female    DOB: 05-03-1950, 64 y.o.   MRN: [id number]  HPI 64 year old White Female in today for health maintenance exam. Has history of hypertension which is well-controlled on current regimen. Last seen June 2015. She has a history of hypothyroidism, hypertension, osteopenia, attention deficit disorder. Currently not on attention deficit medication. Was diagnosed with hyperthyroidism in 2004 and underwent a hemithyroidectomy resulting in hypothyroidism. Was started on anti-hypertensive medication November 2010. Is on Prozac for dysthymic disorder/depression.  Colonoscopy in 2008. Tetanus immunization 2010.  Social history: Patient is married and enjoys sculpting and painting. She does not smoke. Social alcohol consumption consisting of be red wine. 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 Husband is self-employed Chartered certified accountant. Patient has worked as a Scientist, clinical (histocompatibility and immunogenetics) at [employer].  Family history: Mother in her 42s with history of hypertension, stroke, MI, fractured hip and anemia. Father died at age 73 with history of dementia and had arthritis of his hands.       Review of Systems  Respiratory: Negative.   Cardiovascular: Negative.   Gastrointestinal: Negative.   Musculoskeletal:       Some hand stiffness       Objective:   Physical Exam  Constitutional: She is oriented to person, place, and time. She appears well-developed and well-nourished. No distress.  HENT:  Head: Normocephalic and atraumatic.  Right Ear: External ear normal.  Left Ear: External ear normal.  Mouth/Throat: Oropharynx is clear and moist. No oropharyngeal exudate.  Eyes: Conjunctivae are normal. Pupils are equal, round, and reactive to light. Right eye exhibits no discharge. Left eye exhibits no discharge. No scleral icterus.  Neck: Neck supple. No JVD present. No thyromegaly present.  Cardiovascular: Normal rate,  regular rhythm, normal heart sounds and intact distal pulses.   No murmur heard. Pulmonary/Chest: Effort normal and breath sounds normal. No respiratory distress. She has no wheezes. She has no rales. She exhibits no tenderness.  Abdominal: Soft. Bowel sounds are normal. She exhibits no distension and no mass. There is no tenderness. There is no rebound and no guarding.  Genitourinary:  Pap done 2014. Deferred this time. Bimanual normal.  Musculoskeletal: She exhibits no edema.  Heberden's and Bouchard's nodes right second and third fingers.  Lymphadenopathy:    She has no cervical adenopathy.  Neurological: She is alert and oriented to person, place, and time. She has normal reflexes. No cranial nerve deficit. Coordination normal.  Skin: Skin is warm and dry. No rash noted. She is not diaphoretic.  Psychiatric: She has a normal mood and affect. Her behavior is normal. Judgment and thought content normal.  Vitals reviewed.         Assessment & Plan:  Normal health maintenance exam  Hypertension-stable graft history of attention deficit-no longer takes Adderall  History of dysthymic disorder-treated with Prozac  Osteoarthritis of hands  Hypothyroidism-TSH stable on thyroid replacement status post hemithyroidectomy  History of neutropenia-white blood cell count 3800  Very high HDL cholesterol  Plan: Return in one year or as needed. Recommend annual mammogram. Last mammogram 2015. Had bone density study 2015 showing very mild osteopenia.

## 2015-03-30 ENCOUNTER — Other Ambulatory Visit: Payer: Self-pay | Admitting: Internal Medicine

## 2015-04-13 ENCOUNTER — Other Ambulatory Visit: Payer: Self-pay | Admitting: Internal Medicine

## 2015-04-13 ENCOUNTER — Other Ambulatory Visit: Payer: BLUE CROSS/BLUE SHIELD | Admitting: Internal Medicine

## 2015-04-13 DIAGNOSIS — E039 Hypothyroidism, unspecified: Secondary | ICD-10-CM

## 2015-04-13 LAB — TSH: TSH: 2.32 mIU/L

## 2015-07-05 ENCOUNTER — Other Ambulatory Visit: Payer: Self-pay | Admitting: Internal Medicine

## 2015-07-06 NOTE — Telephone Encounter (Signed)
Due for CPE after 09/29/15 please call and schedule before refilling through August

## 2015-09-30 ENCOUNTER — Other Ambulatory Visit: Payer: BLUE CROSS/BLUE SHIELD | Admitting: Internal Medicine

## 2015-09-30 DIAGNOSIS — E039 Hypothyroidism, unspecified: Secondary | ICD-10-CM

## 2015-09-30 DIAGNOSIS — I10 Essential (primary) hypertension: Secondary | ICD-10-CM

## 2015-09-30 DIAGNOSIS — Z0000 Encounter for general adult medical examination without abnormal findings: Secondary | ICD-10-CM

## 2015-09-30 LAB — COMPLETE METABOLIC PANEL WITH GFR
ALBUMIN: 4.4 g/dL (ref 3.6–5.1)
ALT: 17 U/L (ref 6–29)
AST: 33 U/L (ref 10–35)
Alkaline Phosphatase: 86 U/L (ref 33–130)
BILIRUBIN TOTAL: 0.6 mg/dL (ref 0.2–1.2)
BUN: 15 mg/dL (ref 7–25)
CALCIUM: 9.7 mg/dL (ref 8.6–10.4)
CO2: 24 mmol/L (ref 20–31)
CREATININE: 0.94 mg/dL (ref 0.50–0.99)
Chloride: 101 mmol/L (ref 98–110)
GFR, EST AFRICAN AMERICAN: 74 mL/min (ref >=60)
GFR, Est Non African American: 64 mL/min (ref >=60)
Glucose, Bld: 69 mg/dL (ref 65–99)
Potassium: 5 mmol/L (ref 3.5–5.3)
Sodium: 138 mmol/L (ref 135–146)
TOTAL PROTEIN: 6.9 g/dL (ref 6.1–8.1)

## 2015-09-30 LAB — CBC WITH DIFFERENTIAL/PLATELET
BASOS PCT: 1 %
Basophils Absolute: 33 cells/uL (ref 0–200)
EOS ABS: 132 {cells}/uL (ref 15–500)
Eosinophils Relative: 4 %
HEMATOCRIT: 39.3 % (ref 35.0–45.0)
HEMOGLOBIN: 13.3 g/dL (ref 11.7–15.5)
LYMPHS ABS: 1584 {cells}/uL (ref 850–3900)
Lymphocytes Relative: 48 %
MCH: 34 pg — ABNORMAL HIGH (ref 27.0–33.0)
MCHC: 33.8 g/dL (ref 32.0–36.0)
MCV: 100.5 fL — AB (ref 80.0–100.0)
MONO ABS: 330 {cells}/uL (ref 200–950)
MPV: 9.8 fL (ref 7.5–12.5)
Monocytes Relative: 10 %
NEUTROS ABS: 1221 {cells}/uL — AB (ref 1500–7800)
Neutrophils Relative %: 37 %
Platelets: 188 10*3/uL (ref 140–400)
RBC: 3.91 MIL/uL (ref 3.80–5.10)
RDW: 14 % (ref 11.0–15.0)
WBC: 3.3 10*3/uL — AB (ref 3.8–10.8)

## 2015-09-30 LAB — LIPID PANEL
CHOL/HDL RATIO: 1.3 ratio (ref <=5.0)
Cholesterol: 231 mg/dL — ABNORMAL HIGH (ref 125–200)
HDL: 183 mg/dL (ref >=46)
LDL CALC: 40 mg/dL (ref <130)
Triglycerides: 38 mg/dL (ref <150)
VLDL: 8 mg/dL (ref <30)

## 2015-09-30 LAB — TSH: TSH: 1.23 m[IU]/L

## 2015-10-01 LAB — VITAMIN D 25 HYDROXY (VIT D DEFICIENCY, FRACTURES): VIT D 25 HYDROXY: 42 ng/mL (ref 30–100)

## 2015-10-05 ENCOUNTER — Encounter: Payer: Self-pay | Admitting: Internal Medicine

## 2015-10-05 ENCOUNTER — Ambulatory Visit (INDEPENDENT_AMBULATORY_CARE_PROVIDER_SITE_OTHER): Payer: BLUE CROSS/BLUE SHIELD | Admitting: Internal Medicine

## 2015-10-05 VITALS — BP 124/74 | HR 75 | Temp 97.6°F | Ht 63.0 in | Wt 140.5 lb

## 2015-10-05 DIAGNOSIS — Z8659 Personal history of other mental and behavioral disorders: Secondary | ICD-10-CM | POA: Diagnosis not present

## 2015-10-05 DIAGNOSIS — Z0000 Encounter for general adult medical examination without abnormal findings: Secondary | ICD-10-CM | POA: Diagnosis not present

## 2015-10-05 DIAGNOSIS — I10 Essential (primary) hypertension: Secondary | ICD-10-CM

## 2015-10-05 DIAGNOSIS — R7989 Other specified abnormal findings of blood chemistry: Secondary | ICD-10-CM | POA: Diagnosis not present

## 2015-10-05 DIAGNOSIS — M19041 Primary osteoarthritis, right hand: Secondary | ICD-10-CM | POA: Diagnosis not present

## 2015-10-05 DIAGNOSIS — E039 Hypothyroidism, unspecified: Secondary | ICD-10-CM | POA: Diagnosis not present

## 2015-10-05 LAB — POCT URINALYSIS DIPSTICK
BILIRUBIN UA: NEGATIVE
Blood, UA: NEGATIVE
GLUCOSE UA: NEGATIVE
KETONES UA: NEGATIVE
NITRITE UA: NEGATIVE
PH UA: 5
Protein, UA: NEGATIVE
Spec Grav, UA: 1.01
Urobilinogen, UA: NEGATIVE

## 2015-10-05 MED ORDER — LEVOTHYROXINE SODIUM 50 MCG PO TABS: ORAL_TABLET | ORAL | 3 refills | Status: DC

## 2015-10-05 NOTE — Patient Instructions (Signed)
Decrease Prozac to 20 mg daily. Continue  Same dose of thyroid medication.

## 2015-10-13 ENCOUNTER — Other Ambulatory Visit: Payer: Self-pay | Admitting: Internal Medicine

## 2015-10-13 DIAGNOSIS — Z1231 Encounter for screening mammogram for malignant neoplasm of breast: Secondary | ICD-10-CM

## 2015-10-19 ENCOUNTER — Ambulatory Visit
Admission: RE | Admit: 2015-10-19 | Discharge: 2015-10-19 | Disposition: A | Discharge status: Home / Self Care | Payer: BLUE CROSS/BLUE SHIELD | Source: Ambulatory Visit | Attending: Internal Medicine | Admitting: Internal Medicine

## 2015-10-19 DIAGNOSIS — Z1231 Encounter for screening mammogram for malignant neoplasm of breast: Secondary | ICD-10-CM

## 2015-10-25 ENCOUNTER — Telehealth: Payer: Self-pay | Admitting: Internal Medicine

## 2015-10-25 MED ORDER — RAMIPRIL 10 MG PO CAPS: 10.0000 mg | ORAL_CAPSULE | Freq: Every day | ORAL | 0 refills | Status: DC

## 2015-10-25 MED ORDER — FLUOXETINE HCL 20 MG PO TABS
40.0000 mg | ORAL_TABLET | Freq: Every day | ORAL | 3 refills | Status: DC
Start: 2015-10-25 — End: 2015-12-17

## 2015-10-25 MED ORDER — FLUOXETINE HCL 20 MG PO TABS: 40.0000 mg | ORAL_TABLET | Freq: Every day | ORAL | 3 refills | Status: DC

## 2015-10-25 MED ORDER — RAMIPRIL 10 MG PO CAPS: 10.0000 mg | ORAL_CAPSULE | Freq: Every day | ORAL | 2 refills | Status: DC

## 2015-10-25 NOTE — Telephone Encounter (Signed)
Please refill these 2 medications to local pharmacy as requested

## 2015-10-25 NOTE — Addendum Note (Signed)
Addended by: Milta Deiters on: 10/25/2015 11:44 AM   Modules accepted: Orders

## 2015-10-25 NOTE — Telephone Encounter (Signed)
Patient calling for refill on Prozac 20mg  and Ramipril 10mg   Pharmacy:  [facility] [phone]  **Patient is out of Ramipril completely:  Can we please send some to her [place] so that she will have some on hand until the mail order arrives.  [facility] [address].

## 2015-10-25 NOTE — Telephone Encounter (Signed)
Done

## 2015-10-28 NOTE — Progress Notes (Signed)
   Subjective:    Patient ID: Alexandra Taylor, female    DOB: October 03, 1950, 65 y.o.   MRN: [id number]  HPI 65 year old female in today for health maintenance exam and evaluation of medical issues she has a history of hypertension which is well-controlled on current regimen. History of hypothyroidism, osteopenia, attention deficit disorder. Was diagnosed with hyperthyroidism in 2004 and underwent a hemithyroidectomy resulting in hypothyroidism. Was started on antihypertensive medication in November 2010. Has taken Prozac for dysthymic disorder and depression.  Social history: She is married. Enjoys sculpting and painting. Does not smoke. Social alcohol consumption consisting of red wine. 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 Husband is a self-employed Chartered certified accountant. 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  Family history: Mother in her 64s with history of hypertension, stroke, MI, fractured hip and anemia. Father died at age 29 with history of dementia and had arthritis of the hands. 7 brothers and 3 sisters all are healthy.    Review of Systems  Constitutional: Negative.   Musculoskeletal:       Some hand stiffness  All other systems reviewed and are negative.      Objective:   Physical Exam  Constitutional: She is oriented to person, place, and time. She appears well-developed and well-nourished. No distress.  HENT:  Head: Normocephalic and atraumatic.  Right Ear: External ear normal.  Left Ear: External ear normal.  Mouth/Throat: Oropharynx is clear and moist.  Eyes: Conjunctivae and EOM are normal. Pupils are equal, round, and reactive to light. Left eye exhibits no discharge. No scleral icterus.  Neck: No JVD present. No thyromegaly present.  Cardiovascular: Normal rate, normal heart sounds and intact distal pulses.   No murmur heard. Pulmonary/Chest: Breath sounds normal. She has no wheezes. She has no rales.    Breasts normal female  Abdominal: She exhibits no distension and no mass. There is no tenderness. There is no rebound and no guarding.  Genitourinary:  Genitourinary Comments: Pap taken 2014. Deferred  Musculoskeletal: She exhibits no edema.  Heberden's and Bouchard's nodes  Lymphadenopathy:    She has no cervical adenopathy.  Neurological: She is alert and oriented to person, place, and time. She has normal reflexes.  Skin: Skin is warm and dry. No rash noted. She is not diaphoretic.  Psychiatric: She has a normal mood and affect. Her behavior is normal. Judgment and thought content normal.  Vitals reviewed.         Assessment & Plan:  Hypothyroidism-stable on thyroid replacement therapy  Osteoarthritis of hands  History of dysthymic disorder and depression treated with Prozac-decrease Prozac to 20 mg daily.  Hypertension-stable  Very high HDL cholesterol  Plan: Return in one year or as needed

## 2015-11-22 ENCOUNTER — Other Ambulatory Visit: Payer: Self-pay | Admitting: Internal Medicine

## 2015-12-01 ENCOUNTER — Other Ambulatory Visit: Payer: Self-pay | Admitting: Internal Medicine

## 2015-12-02 NOTE — Telephone Encounter (Signed)
Verbal order by Dr. Renold Genta.  Ok to fill Triamcinolone 0.1% Cream.  Apply 1 application topically 3 times daily.  Disp. 30g, 0 refill.  [facility] [place] @ [phone].

## 2015-12-17 ENCOUNTER — Other Ambulatory Visit: Payer: Self-pay

## 2015-12-17 MED ORDER — FLUOXETINE HCL 20 MG PO TABS: 40.0000 mg | ORAL_TABLET | Freq: Every day | ORAL | 0 refills | Status: DC

## 2015-12-17 MED ORDER — RAMIPRIL 10 MG PO CAPS: 10.0000 mg | ORAL_CAPSULE | Freq: Every day | ORAL | 0 refills | Status: DC

## 2016-01-25 ENCOUNTER — Other Ambulatory Visit: Payer: Self-pay | Admitting: Internal Medicine

## 2016-01-25 MED ORDER — FLUOXETINE HCL 20 MG PO TABS: 40.0000 mg | ORAL_TABLET | Freq: Every day | ORAL | 1 refills | Status: DC

## 2016-03-16 ENCOUNTER — Encounter: Payer: Self-pay | Admitting: Internal Medicine

## 2016-03-16 ENCOUNTER — Telehealth: Payer: Self-pay | Admitting: Internal Medicine

## 2016-03-16 NOTE — Telephone Encounter (Signed)
Patient had booked appointment tomorrow to discuss naltrexone therapy. She has been reading about this for substance abuse. Says she's been drinking too much alcohol. Patient referred to [facility]

## 2016-03-17 ENCOUNTER — Ambulatory Visit: Payer: BLUE CROSS/BLUE SHIELD | Admitting: Internal Medicine

## 2016-03-28 ENCOUNTER — Telehealth: Payer: Self-pay | Admitting: Internal Medicine

## 2016-03-28 MED ORDER — FLUOXETINE HCL 20 MG PO TABS: 40.0000 mg | ORAL_TABLET | Freq: Every day | ORAL | 1 refills | Status: DC

## 2016-03-28 MED ORDER — RAMIPRIL 10 MG PO CAPS: 10.0000 mg | ORAL_CAPSULE | Freq: Every day | ORAL | 1 refills | Status: DC

## 2016-03-28 NOTE — Telephone Encounter (Signed)
Refill Prozac and Ramipril for 6 months to [facility] order service. Patient has appointment booked for physical exam in August.

## 2016-03-29 ENCOUNTER — Other Ambulatory Visit: Payer: Self-pay | Admitting: Internal Medicine

## 2016-05-05 ENCOUNTER — Other Ambulatory Visit: Payer: Self-pay | Admitting: Internal Medicine

## 2016-05-19 ENCOUNTER — Encounter: Payer: Self-pay | Admitting: Internal Medicine

## 2016-05-19 ENCOUNTER — Ambulatory Visit
Admission: RE | Admit: 2016-05-19 | Discharge: 2016-05-19 | Disposition: A | Discharge status: Home / Self Care | Payer: BLUE CROSS/BLUE SHIELD | Source: Ambulatory Visit | Attending: Internal Medicine | Admitting: Internal Medicine

## 2016-05-19 ENCOUNTER — Ambulatory Visit (INDEPENDENT_AMBULATORY_CARE_PROVIDER_SITE_OTHER): Payer: BLUE CROSS/BLUE SHIELD | Admitting: Internal Medicine

## 2016-05-19 VITALS — BP 120/62 | HR 78 | Temp 97.7°F | Wt 140.0 lb

## 2016-05-19 DIAGNOSIS — S299XXA Unspecified injury of thorax, initial encounter: Secondary | ICD-10-CM | POA: Diagnosis not present

## 2016-05-19 DIAGNOSIS — R0781 Pleurodynia: Secondary | ICD-10-CM | POA: Diagnosis not present

## 2016-05-19 MED ORDER — HYDROCODONE-ACETAMINOPHEN 5-325 MG PO TABS
1.0000 | ORAL_TABLET | Freq: Four times a day (QID) | ORAL | 0 refills | Status: DC | PRN
Start: 2016-05-19 — End: 2017-11-29

## 2016-05-19 NOTE — Progress Notes (Addendum)
   Subjective:    Patient ID: Alexandra Taylor, female    DOB: 1951/02/04, 66 y.o.   MRN: [id number]  HPI Patient was on a ladder doing something around her house about 2  days ago when she sort of fell forward and struck the side of the ladder injuring her left rib cage area. She says she struck the area quite hard. She did not fall off the ladder. Now having considerable pain left rib cage area. No shortness of breath.  Patient called recently asked  about Suboxone for alcohol abuse. She says she has cut back on her drinking and never started Suboxone although she did visit the [place]. She felt awake time was long and she wasn't pleased. Suggested if she needs help in the future to consider counseling at [facility] or AA.    Review of Systems     Objective:   Physical Exam  She has point tenderness along the left anterior rib cage area mid axillary line. Chest clear to auscultation without rales or wheezing. Chest x-ray is negative. Left rib detail shows a subtle cortical irregularity in the left rib. Nondisplaced rib fracture cannot be excluded. No pneumothorax.      Assessment & Plan:  Musculoskeletal pain secondary to accident on ladder  Plan: Norco 5/325 sparingly every 6 hours when necessary pain. #20 no refill. May apply heat or ice to the area. If rib is broken, may take 4-6 weeks to heal.

## 2016-05-22 NOTE — Patient Instructions (Signed)
Apply heat or ice to the left chest wall 20 minutes twice a day. Take Norco 5/325 sparingly every 6 hours if needed.

## 2016-07-06 DIAGNOSIS — L570 Actinic keratosis: Secondary | ICD-10-CM | POA: Diagnosis not present

## 2016-09-29 ENCOUNTER — Other Ambulatory Visit: Payer: PPO | Admitting: Internal Medicine

## 2016-09-29 ENCOUNTER — Other Ambulatory Visit: Payer: Self-pay | Admitting: Internal Medicine

## 2016-09-29 DIAGNOSIS — Z1321 Encounter for screening for nutritional disorder: Secondary | ICD-10-CM

## 2016-09-29 DIAGNOSIS — E2839 Other primary ovarian failure: Secondary | ICD-10-CM | POA: Diagnosis not present

## 2016-09-29 DIAGNOSIS — E039 Hypothyroidism, unspecified: Secondary | ICD-10-CM

## 2016-09-29 DIAGNOSIS — Z13220 Encounter for screening for lipoid disorders: Secondary | ICD-10-CM | POA: Diagnosis not present

## 2016-09-29 DIAGNOSIS — Z0000 Encounter for general adult medical examination without abnormal findings: Secondary | ICD-10-CM

## 2016-09-29 DIAGNOSIS — Z130 Encounter for screening for diseases of the blood and blood-forming organs and certain disorders involving the immune mechanism: Secondary | ICD-10-CM

## 2016-09-29 DIAGNOSIS — I10 Essential (primary) hypertension: Secondary | ICD-10-CM | POA: Diagnosis not present

## 2016-09-29 LAB — CBC WITH DIFFERENTIAL/PLATELET
Basophils Absolute: 35 cells/uL (ref 0–200)
Basophils Relative: 1 %
Eosinophils Absolute: 175 cells/uL (ref 15–500)
Eosinophils Relative: 5 %
HCT: 41.5 % (ref 35.0–45.0)
Hemoglobin: 14 g/dL (ref 11.7–15.5)
Lymphocytes Relative: 46 %
Lymphs Abs: 1610 cells/uL (ref 850–3900)
MCH: 34.1 pg — ABNORMAL HIGH (ref 27.0–33.0)
MCHC: 33.7 g/dL (ref 32.0–36.0)
MCV: 101 fL — ABNORMAL HIGH (ref 80.0–100.0)
MPV: 10 fL (ref 7.5–12.5)
Monocytes Absolute: 385 cells/uL (ref 200–950)
Monocytes Relative: 11 %
Neutro Abs: 1295 cells/uL — ABNORMAL LOW (ref 1500–7800)
Neutrophils Relative %: 37 %
Platelets: 174 10*3/uL (ref 140–400)
RBC: 4.11 MIL/uL (ref 3.80–5.10)
RDW: 14 % (ref 11.0–15.0)
WBC: 3.5 10*3/uL — ABNORMAL LOW (ref 3.8–10.8)

## 2016-09-30 ENCOUNTER — Other Ambulatory Visit: Payer: Self-pay | Admitting: Internal Medicine

## 2016-09-30 LAB — LIPID PANEL
CHOLESTEROL: 220 mg/dL — AB (ref <200)
HDL: 170 mg/dL (ref >50)
LDL Cholesterol: 45 mg/dL (ref <100)
TRIGLYCERIDES: 25 mg/dL (ref <150)
Total CHOL/HDL Ratio: 1.3 Ratio (ref <5.0)
VLDL: 5 mg/dL (ref <30)

## 2016-09-30 LAB — COMPLETE METABOLIC PANEL WITH GFR
ALBUMIN: 4.1 g/dL (ref 3.6–5.1)
ALT: 16 U/L (ref 6–29)
AST: 29 U/L (ref 10–35)
Alkaline Phosphatase: 120 U/L (ref 33–130)
BILIRUBIN TOTAL: 0.7 mg/dL (ref 0.2–1.2)
BUN: 15 mg/dL (ref 7–25)
CO2: 23 mmol/L (ref 20–31)
CREATININE: 0.99 mg/dL (ref 0.50–0.99)
Calcium: 9.4 mg/dL (ref 8.6–10.4)
Chloride: 101 mmol/L (ref 98–110)
GFR, EST AFRICAN AMERICAN: 69 mL/min (ref >=60)
GFR, EST NON AFRICAN AMERICAN: 60 mL/min (ref >=60)
Glucose, Bld: 74 mg/dL (ref 65–99)
POTASSIUM: 5.1 mmol/L (ref 3.5–5.3)
Sodium: 136 mmol/L (ref 135–146)
Total Protein: 6.7 g/dL (ref 6.1–8.1)

## 2016-09-30 LAB — TSH: TSH: 1.97 mIU/L

## 2016-09-30 LAB — VITAMIN D 25 HYDROXY (VIT D DEFICIENCY, FRACTURES): VIT D 25 HYDROXY: 41 ng/mL (ref 30–100)

## 2016-10-03 LAB — FOLATE: FOLATE: 16.6 ng/mL (ref >5.4)

## 2016-10-03 LAB — VITAMIN B12: VITAMIN B 12: 808 pg/mL (ref 200–1100)

## 2016-10-05 ENCOUNTER — Ambulatory Visit (INDEPENDENT_AMBULATORY_CARE_PROVIDER_SITE_OTHER): Payer: PPO | Admitting: Internal Medicine

## 2016-10-05 ENCOUNTER — Encounter: Payer: Self-pay | Admitting: Internal Medicine

## 2016-10-05 VITALS — BP 126/82 | HR 67 | Temp 97.9°F | Ht 64.0 in | Wt 141.0 lb

## 2016-10-05 DIAGNOSIS — Z0000 Encounter for general adult medical examination without abnormal findings: Secondary | ICD-10-CM

## 2016-10-05 DIAGNOSIS — R8290 Unspecified abnormal findings in urine: Secondary | ICD-10-CM

## 2016-10-05 DIAGNOSIS — R7989 Other specified abnormal findings of blood chemistry: Secondary | ICD-10-CM

## 2016-10-05 DIAGNOSIS — Z8659 Personal history of other mental and behavioral disorders: Secondary | ICD-10-CM

## 2016-10-05 DIAGNOSIS — Z1231 Encounter for screening mammogram for malignant neoplasm of breast: Secondary | ICD-10-CM

## 2016-10-05 DIAGNOSIS — I10 Essential (primary) hypertension: Secondary | ICD-10-CM

## 2016-10-05 DIAGNOSIS — Z13820 Encounter for screening for osteoporosis: Secondary | ICD-10-CM | POA: Diagnosis not present

## 2016-10-05 DIAGNOSIS — R300 Dysuria: Secondary | ICD-10-CM | POA: Diagnosis not present

## 2016-10-05 DIAGNOSIS — E039 Hypothyroidism, unspecified: Secondary | ICD-10-CM

## 2016-10-05 DIAGNOSIS — M19041 Primary osteoarthritis, right hand: Secondary | ICD-10-CM | POA: Diagnosis not present

## 2016-10-05 DIAGNOSIS — Z1239 Encounter for other screening for malignant neoplasm of breast: Secondary | ICD-10-CM

## 2016-10-05 LAB — POCT URINALYSIS DIPSTICK
Bilirubin, UA: NEGATIVE
Blood, UA: NEGATIVE
Glucose, UA: NEGATIVE
KETONES UA: NEGATIVE
LEUKOCYTES UA: NEGATIVE
Nitrite, UA: POSITIVE
PH UA: 5 (ref 5.0–8.0)
PROTEIN UA: NEGATIVE
SPEC GRAV UA: 1.02 (ref 1.010–1.025)
UROBILINOGEN UA: 0.2 U/dL

## 2016-10-05 NOTE — Progress Notes (Signed)
Subjective:    Patient ID: Alexandra Taylor, female    DOB: 1950-06-06, 66 y.o.   MRN: [id number]  HPI  66 year old Female for Welcome to Medicare exam, health maintenance exam and evaluation of medical issues.  She called several months ago and asked about being put on Suboxone for alcohol issues. Apparently has cut back on alcohol consumption and did not seek treatment with that medication. Explained to her that I did not prescribe it. Doesn't think she has an issue or needs to go to Alcoholics Anonymous.  History of hypertension and hypothyroidism. Was diagnosed with hyperthyroidism in 2004 and underwent hemithyroidectomy resulting in hypothyroidism. Was started on antihypertensive medication in November 2010. Has taken Prozac for dysthymic disorder and depression.  Social history: She is married. Enjoys sculpting and painting. Does not smoke. Social alcohol consumption consisting of red wine. 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 Husband is a self-employed Chartered certified accountant. 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  Family history: Mother in her 32s with history of hypertension, stroke, MI, fractured hip and anemia. Father died at age 21 with history of dementia and had arthritis of the hands. 7 brothers and 3 sisters all healthy.        Review of Systems  Constitutional: Negative.   All other systems reviewed and are negative.      Objective:   Physical Exam  Constitutional: She is oriented to person, place, and time. She appears well-developed and well-nourished. No distress.  HENT:  Head: Normocephalic and atraumatic.  Right Ear: External ear normal.  Left Ear: External ear normal.  Mouth/Throat: Oropharynx is clear and moist.  Eyes: Pupils are equal, round, and reactive to light. Conjunctivae and EOM are normal. Right eye exhibits no discharge. Left eye exhibits no discharge. No scleral icterus.  Neck: Neck supple. No  JVD present. No thyromegaly present.  Cardiovascular: Normal rate, regular rhythm, normal heart sounds and intact distal pulses.   No murmur heard. Pulmonary/Chest: Effort normal and breath sounds normal. No respiratory distress. She has no wheezes. She has no rales. She exhibits no tenderness.  Breasts normal female  Abdominal: Soft. Bowel sounds are normal. She exhibits no distension and no mass. There is no tenderness. There is no rebound and no guarding.  Genitourinary:  Genitourinary Comments: Deferred to GYN  Musculoskeletal: She exhibits no edema.  Heberden's and Bouchard's nodes  Lymphadenopathy:    She has no cervical adenopathy.  Neurological: She is alert and oriented to person, place, and time. She has normal reflexes. No cranial nerve deficit. Coordination normal.  Skin: Skin is warm and dry. No rash noted. She is not diaphoretic.  Psychiatric: She has a normal mood and affect. Her behavior is normal. Judgment and thought content normal.  Vitals reviewed.         Assessment & Plan:  Essential hypertension  History of dysthymia and depression  Hypothyroidism  High HDL cholesterol  Osteoarthritis and pain in hands  Plan: Continue same medications and return in one year or as needed. Urinalysis is abnormal and culture will be obtained. She is asymptomatic  Subjective:   Patient presents for Medicare Annual/Subsequent preventive examination.  Review Past Medical/Family/Social:See above   Risk Factors  Current exercise habits: Exercises regularly Dietary issues discussed: Low fat low carbohydrate  Cardiac risk factors:Family history of stroke and MI in her mother  Depression Screen  (Note: if answer to either of the following is "  Yes", a more complete depression screening is indicated)   Over the past two weeks, have you felt down, depressed or hopeless? No  Over the past two weeks, have you felt little interest or pleasure in doing things? No Have you lost  interest or pleasure in daily life? No Do you often feel hopeless? No Do you cry easily over simple problems? No   Activities of Daily Living  In your present state of health, do you have any difficulty performing the following activities?:   Driving? No  Managing money? No  Feeding yourself? No  Getting from bed to chair? No  Climbing a flight of stairs? No  Preparing food and eating?: No  Bathing or showering? No  Getting dressed: No  Getting to the toilet? No  Using the toilet:No  Moving around from place to place: No  In the past year have you fallen or had a near fall?:No  Are you sexually active? Yes Do you have more than one partner? No   Hearing Difficulties: No  Do you often ask people to speak up or repeat themselves? Yes Do you experience ringing or noises in your ears? Yes Do you have difficulty understanding soft or whispered voices? Yes Do you feel that you have a problem with memory? No Do you often misplace items? No    Home Safety:  Do you have a smoke alarm at your residence? Yes Do you have grab bars in the bathroom?No Do you have throw rugs in your house? No   Cognitive Testing  Alert? Yes Normal Appearance?Yes  Oriented to person? Yes Place? Yes  Time? Yes  Recall of three objects? Yes  Can perform simple calculations? Yes  Displays appropriate judgment?Yes  Can read the correct time from a watch face?Yes   List the Names of Other Physician/Practitioners you currently use:  See referral list for the physicians patient is currently seeing.     Review of Systems: See above   Objective:     General appearance: Appears Younger than stated age Head: Normocephalic, without obvious abnormality, atraumatic  Eyes: conj clear, EOMi PEERLA  Ears: normal TM's and external ear canals both ears  Nose: Nares normal. Septum midline. Mucosa normal. No drainage or sinus tenderness.  Throat: lips, mucosa, and tongue normal; teeth and gums normal  Neck:  no adenopathy, no carotid bruit, no JVD, supple, symmetrical, trachea midline and thyroid not enlarged, symmetric, no tenderness/mass/nodules  No CVA tenderness.  Lungs: clear to auscultation bilaterally  Breasts: normal appearance, no masses or tenderness, top of the pacemaker on left upper chest. Incision well-healed. It is tender.  Heart: regular rate and rhythm, S1, S2 normal, no murmur, click, rub or gallop  Abdomen: soft, non-tender; bowel sounds normal; no masses, no organomegaly  Musculoskeletal: ROM normal in all joints, no crepitus, no deformity, Normal muscle strengthen. Back  is symmetric, no curvature. Skin: Skin color, texture, turgor normal. No rashes or lesions  Lymph nodes: Cervical, supraclavicular, and axillary nodes normal.  Neurologic: CN 2 -12 Normal, Normal symmetric reflexes. Normal coordination and gait  Psych: Alert & Oriented x 3, Mood appear stable.    Assessment:    Annual wellness medicare exam   Plan:    During the course of the visit the patient was educated and counseled about appropriate screening and preventive services including:   Annual mammogram  Prevnar 13     Patient Instructions (the written plan) was given to the patient.  Medicare Attestation  I have  personally reviewed:  The patient's medical and social history  Their use of alcohol, tobacco or illicit drugs  Their current medications and supplements  The patient's functional ability including ADLs,fall risks, home safety risks, cognitive, and hearing and visual impairment  Diet and physical activities  Evidence for depression or mood disorders  The patient's weight, height, BMI, and visual acuity have been recorded in the chart. I have made referrals, counseling, and provided education to the patient based on review of the above and I have provided the patient with a written personalized care plan for preventive services.

## 2016-10-06 ENCOUNTER — Ambulatory Visit (INDEPENDENT_AMBULATORY_CARE_PROVIDER_SITE_OTHER): Payer: PPO | Admitting: Internal Medicine

## 2016-10-06 ENCOUNTER — Encounter: Payer: Self-pay | Admitting: Internal Medicine

## 2016-10-06 VITALS — BP 120/80 | HR 83 | Temp 98.6°F

## 2016-10-06 DIAGNOSIS — Z23 Encounter for immunization: Secondary | ICD-10-CM | POA: Diagnosis not present

## 2016-10-06 NOTE — Progress Notes (Signed)
Prevnar given today in the left arm. Pt tolerated well.  Lenise Arena, CMA

## 2016-10-06 NOTE — Patient Instructions (Signed)
Prevnar Vaccine given today.

## 2016-10-08 LAB — URINE CULTURE

## 2016-10-09 ENCOUNTER — Telehealth: Payer: Self-pay | Admitting: Internal Medicine

## 2016-10-09 MED ORDER — CIPROFLOXACIN HCL 500 MG PO TABS: 500.0000 mg | ORAL_TABLET | Freq: Two times a day (BID) | ORAL | 0 refills | Status: DC

## 2016-10-09 NOTE — Telephone Encounter (Signed)
UTI E coli per culture. Call in Cipro 500 mg bid x 7 days and follow up here with nurse visit in 10 days. Message left on voice mail

## 2016-10-19 ENCOUNTER — Ambulatory Visit (INDEPENDENT_AMBULATORY_CARE_PROVIDER_SITE_OTHER): Payer: PPO | Admitting: Internal Medicine

## 2016-10-19 ENCOUNTER — Encounter: Payer: Self-pay | Admitting: Internal Medicine

## 2016-10-19 VITALS — BP 142/80 | HR 68 | Temp 97.8°F | Ht 64.0 in | Wt 144.0 lb

## 2016-10-19 DIAGNOSIS — N390 Urinary tract infection, site not specified: Secondary | ICD-10-CM

## 2016-10-19 DIAGNOSIS — R8290 Unspecified abnormal findings in urine: Secondary | ICD-10-CM

## 2016-10-19 NOTE — Progress Notes (Signed)
   Subjective:    Patient ID: Alexandra Taylor, female    DOB: 1950/04/08, 66 y.o.   MRN: [id number]  HPI She comes in today for follow-up status post treatment for urinary tract infection discovered at physical examination. On August 9, urine culture grew greater than 100,00 col/ml E.coli and she was treated with Cipro. She is asymptomatic.  She says she obtained a clean catch urine specimen But dipstick UA was reported to me by assistant is containing LE. This was not reported in chart by assistant. Culture will be sent.  She is asymptomatic with regard to dysuria and frequency.    Review of Systems see above     Objective:   Physical Exam  Not examined. Spent 15 minutes speaking with her about this issue. She understands culture has been sent and we will await results.      Assessment & Plan:  Abnormal urinalysis  Await culture results.  Addendum 10/23/2016: 10/19/2016 culture re-incubated for better growth. Still awaiting final results

## 2016-10-21 LAB — URINE CULTURE

## 2016-10-21 NOTE — Patient Instructions (Signed)
Await urine culture results

## 2016-10-23 NOTE — Patient Instructions (Signed)
Continue same medications and return in one year or as needed. Urine culture ordered

## 2016-11-01 ENCOUNTER — Other Ambulatory Visit: Payer: Self-pay | Admitting: Internal Medicine

## 2016-11-13 ENCOUNTER — Telehealth: Payer: Self-pay

## 2016-11-13 MED ORDER — RAMIPRIL 10 MG PO CAPS: ORAL_CAPSULE | ORAL | 0 refills | Status: DC

## 2016-11-13 MED ORDER — FLUOXETINE HCL 20 MG PO CAPS: 40.0000 mg | ORAL_CAPSULE | Freq: Every day | ORAL | 0 refills | Status: DC

## 2016-11-13 NOTE — Telephone Encounter (Signed)
Pt is requesting a refill on her Ramipril 10mg  and Prozac 20mg  to her local pharmacy because she is out right now. Please advise, all other refills will go to [facility]

## 2016-11-13 NOTE — Telephone Encounter (Signed)
Please refill to local pharmacy.

## 2016-11-17 ENCOUNTER — Telehealth: Payer: Self-pay

## 2016-11-17 DIAGNOSIS — E2839 Other primary ovarian failure: Secondary | ICD-10-CM

## 2016-11-17 NOTE — Telephone Encounter (Signed)
Changed codes per [place] for her bone density scan

## 2016-11-30 ENCOUNTER — Other Ambulatory Visit: Payer: Self-pay | Admitting: Internal Medicine

## 2016-11-30 DIAGNOSIS — Z1231 Encounter for screening mammogram for malignant neoplasm of breast: Secondary | ICD-10-CM

## 2016-12-25 ENCOUNTER — Other Ambulatory Visit: Payer: Self-pay | Admitting: Internal Medicine

## 2017-01-05 ENCOUNTER — Other Ambulatory Visit: Payer: Self-pay | Admitting: Internal Medicine

## 2017-01-25 DIAGNOSIS — H60333 Swimmer's ear, bilateral: Secondary | ICD-10-CM | POA: Diagnosis not present

## 2017-01-25 DIAGNOSIS — H6123 Impacted cerumen, bilateral: Secondary | ICD-10-CM | POA: Diagnosis not present

## 2017-02-02 DIAGNOSIS — H6993 Unspecified Eustachian tube disorder, bilateral: Secondary | ICD-10-CM | POA: Diagnosis not present

## 2017-03-14 DIAGNOSIS — M5412 Radiculopathy, cervical region: Secondary | ICD-10-CM | POA: Diagnosis not present

## 2017-03-14 DIAGNOSIS — M25511 Pain in right shoulder: Secondary | ICD-10-CM | POA: Diagnosis not present

## 2017-04-08 ENCOUNTER — Other Ambulatory Visit: Payer: Self-pay | Admitting: Internal Medicine

## 2017-04-09 NOTE — Telephone Encounter (Signed)
Please call and book CPE after August 9 before refilling until then

## 2017-04-11 DIAGNOSIS — M5412 Radiculopathy, cervical region: Secondary | ICD-10-CM | POA: Diagnosis not present

## 2017-04-11 NOTE — Telephone Encounter (Signed)
Called patient to schedule she said she will call back.

## 2017-04-12 ENCOUNTER — Other Ambulatory Visit: Payer: Self-pay | Admitting: Internal Medicine

## 2017-04-12 NOTE — Telephone Encounter (Signed)
Pt needs to make appt for August CPE

## 2017-04-13 ENCOUNTER — Other Ambulatory Visit: Payer: Self-pay

## 2017-04-13 ENCOUNTER — Other Ambulatory Visit: Payer: Self-pay | Admitting: Internal Medicine

## 2017-04-13 ENCOUNTER — Telehealth: Payer: Self-pay

## 2017-04-13 MED ORDER — RAMIPRIL 10 MG PO CAPS: 10.0000 mg | ORAL_CAPSULE | Freq: Every day | ORAL | 6 refills | Status: DC

## 2017-04-13 NOTE — Telephone Encounter (Signed)
Received a fax from [facility] they're out of ramipril 10mg  until the end of march. Patient will call around to see if there other pharmacies to see if they have it.

## 2017-04-25 ENCOUNTER — Other Ambulatory Visit: Payer: Self-pay

## 2017-04-25 MED ORDER — LEVOTHYROXINE SODIUM 50 MCG PO TABS: ORAL_TABLET | ORAL | 1 refills | Status: DC

## 2017-05-21 ENCOUNTER — Other Ambulatory Visit: Payer: Self-pay | Admitting: Orthopedic Surgery

## 2017-05-21 DIAGNOSIS — M5412 Radiculopathy, cervical region: Secondary | ICD-10-CM | POA: Diagnosis not present

## 2017-05-21 DIAGNOSIS — M542 Cervicalgia: Secondary | ICD-10-CM

## 2017-05-23 DIAGNOSIS — M50323 Other cervical disc degeneration at C6-C7 level: Secondary | ICD-10-CM | POA: Diagnosis not present

## 2017-05-23 DIAGNOSIS — M50321 Other cervical disc degeneration at C4-C5 level: Secondary | ICD-10-CM | POA: Diagnosis not present

## 2017-05-23 DIAGNOSIS — M542 Cervicalgia: Secondary | ICD-10-CM | POA: Diagnosis not present

## 2017-05-23 DIAGNOSIS — M50322 Other cervical disc degeneration at C5-C6 level: Secondary | ICD-10-CM | POA: Diagnosis not present

## 2017-05-28 DIAGNOSIS — M50321 Other cervical disc degeneration at C4-C5 level: Secondary | ICD-10-CM | POA: Diagnosis not present

## 2017-05-28 DIAGNOSIS — M542 Cervicalgia: Secondary | ICD-10-CM | POA: Diagnosis not present

## 2017-05-28 DIAGNOSIS — M50323 Other cervical disc degeneration at C6-C7 level: Secondary | ICD-10-CM | POA: Diagnosis not present

## 2017-05-28 DIAGNOSIS — M50322 Other cervical disc degeneration at C5-C6 level: Secondary | ICD-10-CM | POA: Diagnosis not present

## 2017-05-29 ENCOUNTER — Ambulatory Visit
Admission: RE | Admit: 2017-05-29 | Discharge: 2017-05-29 | Disposition: A | Discharge status: Home / Self Care | Payer: PPO | Source: Ambulatory Visit | Attending: Orthopedic Surgery | Admitting: Orthopedic Surgery

## 2017-05-29 DIAGNOSIS — M542 Cervicalgia: Secondary | ICD-10-CM

## 2017-05-29 DIAGNOSIS — M4802 Spinal stenosis, cervical region: Secondary | ICD-10-CM | POA: Diagnosis not present

## 2017-05-30 DIAGNOSIS — M50322 Other cervical disc degeneration at C5-C6 level: Secondary | ICD-10-CM | POA: Diagnosis not present

## 2017-05-30 DIAGNOSIS — M50321 Other cervical disc degeneration at C4-C5 level: Secondary | ICD-10-CM | POA: Diagnosis not present

## 2017-05-30 DIAGNOSIS — M542 Cervicalgia: Secondary | ICD-10-CM | POA: Diagnosis not present

## 2017-05-30 DIAGNOSIS — M50323 Other cervical disc degeneration at C6-C7 level: Secondary | ICD-10-CM | POA: Diagnosis not present

## 2017-06-05 DIAGNOSIS — M50321 Other cervical disc degeneration at C4-C5 level: Secondary | ICD-10-CM | POA: Diagnosis not present

## 2017-06-05 DIAGNOSIS — M542 Cervicalgia: Secondary | ICD-10-CM | POA: Diagnosis not present

## 2017-06-05 DIAGNOSIS — M5412 Radiculopathy, cervical region: Secondary | ICD-10-CM | POA: Diagnosis not present

## 2017-06-06 DIAGNOSIS — M50323 Other cervical disc degeneration at C6-C7 level: Secondary | ICD-10-CM | POA: Diagnosis not present

## 2017-06-06 DIAGNOSIS — M50322 Other cervical disc degeneration at C5-C6 level: Secondary | ICD-10-CM | POA: Diagnosis not present

## 2017-06-06 DIAGNOSIS — M542 Cervicalgia: Secondary | ICD-10-CM | POA: Diagnosis not present

## 2017-06-06 DIAGNOSIS — M50321 Other cervical disc degeneration at C4-C5 level: Secondary | ICD-10-CM | POA: Diagnosis not present

## 2017-06-08 DIAGNOSIS — M50321 Other cervical disc degeneration at C4-C5 level: Secondary | ICD-10-CM | POA: Diagnosis not present

## 2017-06-08 DIAGNOSIS — M542 Cervicalgia: Secondary | ICD-10-CM | POA: Diagnosis not present

## 2017-06-08 DIAGNOSIS — M50322 Other cervical disc degeneration at C5-C6 level: Secondary | ICD-10-CM | POA: Diagnosis not present

## 2017-06-08 DIAGNOSIS — M50323 Other cervical disc degeneration at C6-C7 level: Secondary | ICD-10-CM | POA: Diagnosis not present

## 2017-06-13 DIAGNOSIS — M25511 Pain in right shoulder: Secondary | ICD-10-CM | POA: Diagnosis not present

## 2017-06-13 DIAGNOSIS — M50321 Other cervical disc degeneration at C4-C5 level: Secondary | ICD-10-CM | POA: Diagnosis not present

## 2017-06-13 DIAGNOSIS — M50322 Other cervical disc degeneration at C5-C6 level: Secondary | ICD-10-CM | POA: Diagnosis not present

## 2017-06-25 ENCOUNTER — Ambulatory Visit (INDEPENDENT_AMBULATORY_CARE_PROVIDER_SITE_OTHER): Payer: PPO | Admitting: Internal Medicine

## 2017-06-25 ENCOUNTER — Encounter: Payer: Self-pay | Admitting: Internal Medicine

## 2017-06-25 VITALS — BP 120/70 | HR 86 | Temp 98.6°F | Ht 64.0 in | Wt 140.0 lb

## 2017-06-25 DIAGNOSIS — S30861A Insect bite (nonvenomous) of abdominal wall, initial encounter: Secondary | ICD-10-CM | POA: Diagnosis not present

## 2017-06-25 DIAGNOSIS — W57XXXA Bitten or stung by nonvenomous insect and other nonvenomous arthropods, initial encounter: Secondary | ICD-10-CM | POA: Diagnosis not present

## 2017-06-25 MED ORDER — DOXYCYCLINE HYCLATE 100 MG PO TABS: 100.0000 mg | ORAL_TABLET | Freq: Two times a day (BID) | ORAL | 0 refills | Status: DC

## 2017-06-25 MED ORDER — TRIAMCINOLONE ACETONIDE 0.1 % EX CREA: 1.0000 "application " | TOPICAL_CREAM | Freq: Three times a day (TID) | CUTANEOUS | 2 refills | Status: DC

## 2017-06-25 NOTE — Patient Instructions (Signed)
Doxycycline 100 mg twice daily for 7 days for cellulitis and tick bite area.  Triamcinolone cream to take bite area 3-4 times daily as needed.

## 2017-06-25 NOTE — Progress Notes (Signed)
   Subjective:    Patient ID: Alexandra Taylor, female    DOB: March 09, 1950, 67 y.o.   MRN: [id number]  HPI Just recently within the last day or so noticed a tick bite right lower abdomen.  It created an erythematous area and sort of an oval shape about 2-1/2 cm in diameter.  Very itchy.  No fever chills or rash.  No myalgias.  No headache.  Says it was a small tic which sounds like a deer tick    Review of Systems see above     Objective:   Physical Exam Oval-shaped macular erythematous area about 2.5 cm in diameter with central bite area.  No pus expressed from bite area.       Assessment & Plan:  Tick bite-may be developing an early cellulitis  Plan: Doxycycline 100 mg twice daily for 7 days.  Triamcinolone cream to use on tick bite area 3-4 times a day for itching and inflammation.  Explained to her it could be very itchy for several weeks.

## 2017-06-29 DIAGNOSIS — M542 Cervicalgia: Secondary | ICD-10-CM | POA: Diagnosis not present

## 2017-06-29 DIAGNOSIS — M50323 Other cervical disc degeneration at C6-C7 level: Secondary | ICD-10-CM | POA: Diagnosis not present

## 2017-06-29 DIAGNOSIS — M50322 Other cervical disc degeneration at C5-C6 level: Secondary | ICD-10-CM | POA: Diagnosis not present

## 2017-06-29 DIAGNOSIS — M50321 Other cervical disc degeneration at C4-C5 level: Secondary | ICD-10-CM | POA: Diagnosis not present

## 2017-07-05 ENCOUNTER — Other Ambulatory Visit: Payer: Self-pay | Admitting: Internal Medicine

## 2017-08-09 ENCOUNTER — Other Ambulatory Visit: Payer: Self-pay | Admitting: Internal Medicine

## 2017-09-14 ENCOUNTER — Other Ambulatory Visit: Payer: Self-pay | Admitting: Internal Medicine

## 2017-09-21 ENCOUNTER — Other Ambulatory Visit: Payer: Self-pay | Admitting: Internal Medicine

## 2017-09-21 DIAGNOSIS — Z1231 Encounter for screening mammogram for malignant neoplasm of breast: Secondary | ICD-10-CM

## 2017-10-01 ENCOUNTER — Other Ambulatory Visit: Payer: Self-pay | Admitting: Internal Medicine

## 2017-10-02 ENCOUNTER — Other Ambulatory Visit: Payer: Self-pay | Admitting: Internal Medicine

## 2017-10-02 DIAGNOSIS — Z8659 Personal history of other mental and behavioral disorders: Secondary | ICD-10-CM

## 2017-10-02 DIAGNOSIS — E039 Hypothyroidism, unspecified: Secondary | ICD-10-CM

## 2017-10-02 DIAGNOSIS — I10 Essential (primary) hypertension: Secondary | ICD-10-CM

## 2017-10-02 DIAGNOSIS — Z0000 Encounter for general adult medical examination without abnormal findings: Secondary | ICD-10-CM

## 2017-10-02 DIAGNOSIS — E7800 Pure hypercholesterolemia, unspecified: Secondary | ICD-10-CM

## 2017-10-02 DIAGNOSIS — Z13820 Encounter for screening for osteoporosis: Secondary | ICD-10-CM

## 2017-10-02 DIAGNOSIS — F988 Other specified behavioral and emotional disorders with onset usually occurring in childhood and adolescence: Secondary | ICD-10-CM

## 2017-10-04 ENCOUNTER — Other Ambulatory Visit: Payer: PPO | Admitting: Internal Medicine

## 2017-10-04 DIAGNOSIS — Z8659 Personal history of other mental and behavioral disorders: Secondary | ICD-10-CM

## 2017-10-04 DIAGNOSIS — E039 Hypothyroidism, unspecified: Secondary | ICD-10-CM | POA: Diagnosis not present

## 2017-10-04 DIAGNOSIS — I10 Essential (primary) hypertension: Secondary | ICD-10-CM

## 2017-10-04 DIAGNOSIS — Z13820 Encounter for screening for osteoporosis: Secondary | ICD-10-CM

## 2017-10-04 DIAGNOSIS — F988 Other specified behavioral and emotional disorders with onset usually occurring in childhood and adolescence: Secondary | ICD-10-CM

## 2017-10-04 DIAGNOSIS — Z0000 Encounter for general adult medical examination without abnormal findings: Secondary | ICD-10-CM | POA: Diagnosis not present

## 2017-10-04 DIAGNOSIS — E7800 Pure hypercholesterolemia, unspecified: Secondary | ICD-10-CM

## 2017-10-04 LAB — CBC WITH DIFFERENTIAL/PLATELET
BASOS PCT: 1.8 %
Basophils Absolute: 50 cells/uL (ref 0–200)
EOS PCT: 3.2 %
Eosinophils Absolute: 90 cells/uL (ref 15–500)
HEMATOCRIT: 36.9 % (ref 35.0–45.0)
Hemoglobin: 12.6 g/dL (ref 11.7–15.5)
Lymphs Abs: 1092 cells/uL (ref 850–3900)
MCH: 34.1 pg — ABNORMAL HIGH (ref 27.0–33.0)
MCHC: 34.1 g/dL (ref 32.0–36.0)
MCV: 99.7 fL (ref 80.0–100.0)
MPV: 10.1 fL (ref 7.5–12.5)
Monocytes Relative: 11.9 %
NEUTROS PCT: 44.1 %
Neutro Abs: 1235 cells/uL — ABNORMAL LOW (ref 1500–7800)
Platelets: 199 10*3/uL (ref 140–400)
RBC: 3.7 10*6/uL — AB (ref 3.80–5.10)
RDW: 12.4 % (ref 11.0–15.0)
Total Lymphocyte: 39 %
WBC mixed population: 333 cells/uL (ref 200–950)
WBC: 2.8 10*3/uL — ABNORMAL LOW (ref 3.8–10.8)

## 2017-10-04 LAB — LIPID PANEL
CHOLESTEROL: 196 mg/dL (ref <200)
HDL: 111 mg/dL (ref >50)
LDL Cholesterol (Calc): 77 mg/dL (calc)
Non-HDL Cholesterol (Calc): 85 mg/dL (calc) (ref <130)
Total CHOL/HDL Ratio: 1.8 (calc) (ref <5.0)
Triglycerides: 27 mg/dL (ref <150)

## 2017-10-04 LAB — TSH: TSH: 1.41 mIU/L (ref 0.40–4.50)

## 2017-10-04 LAB — COMPLETE METABOLIC PANEL WITH GFR
AG RATIO: 1.8 (calc) (ref 1.0–2.5)
ALT: 26 U/L (ref 6–29)
AST: 37 U/L — ABNORMAL HIGH (ref 10–35)
Albumin: 4.2 g/dL (ref 3.6–5.1)
Alkaline phosphatase (APISO): 95 U/L (ref 33–130)
BUN: 14 mg/dL (ref 7–25)
CALCIUM: 9.4 mg/dL (ref 8.6–10.4)
CO2: 27 mmol/L (ref 20–32)
CREATININE: 0.92 mg/dL (ref 0.50–0.99)
Chloride: 103 mmol/L (ref 98–110)
GFR, EST NON AFRICAN AMERICAN: 65 mL/min/{1.73_m2} (ref >=60)
GFR, Est African American: 75 mL/min/{1.73_m2} (ref >=60)
GLOBULIN: 2.3 g/dL (ref 1.9–3.7)
Glucose, Bld: 89 mg/dL (ref 65–99)
POTASSIUM: 5.1 mmol/L (ref 3.5–5.3)
SODIUM: 137 mmol/L (ref 135–146)
TOTAL PROTEIN: 6.5 g/dL (ref 6.1–8.1)
Total Bilirubin: 0.6 mg/dL (ref 0.2–1.2)

## 2017-10-08 ENCOUNTER — Ambulatory Visit (INDEPENDENT_AMBULATORY_CARE_PROVIDER_SITE_OTHER): Payer: PPO | Admitting: Internal Medicine

## 2017-10-08 VITALS — BP 110/70 | HR 70 | Ht 63.5 in | Wt 137.0 lb

## 2017-10-08 DIAGNOSIS — M4802 Spinal stenosis, cervical region: Secondary | ICD-10-CM | POA: Diagnosis not present

## 2017-10-08 DIAGNOSIS — Z0000 Encounter for general adult medical examination without abnormal findings: Secondary | ICD-10-CM

## 2017-10-08 DIAGNOSIS — I10 Essential (primary) hypertension: Secondary | ICD-10-CM

## 2017-10-08 DIAGNOSIS — D709 Neutropenia, unspecified: Secondary | ICD-10-CM | POA: Diagnosis not present

## 2017-10-08 DIAGNOSIS — E039 Hypothyroidism, unspecified: Secondary | ICD-10-CM

## 2017-10-08 DIAGNOSIS — Z23 Encounter for immunization: Secondary | ICD-10-CM

## 2017-10-08 DIAGNOSIS — M19049 Primary osteoarthritis, unspecified hand: Secondary | ICD-10-CM | POA: Diagnosis not present

## 2017-10-08 DIAGNOSIS — Z8659 Personal history of other mental and behavioral disorders: Secondary | ICD-10-CM | POA: Diagnosis not present

## 2017-10-08 LAB — POCT URINALYSIS DIPSTICK
Appearance: NEGATIVE
Bilirubin, UA: NEGATIVE
Blood, UA: NEGATIVE
Glucose, UA: NEGATIVE
KETONES UA: NEGATIVE
LEUKOCYTES UA: NEGATIVE
Nitrite, UA: NEGATIVE
Odor: NEGATIVE
PH UA: 6.5 (ref 5.0–8.0)
PROTEIN UA: NEGATIVE
Spec Grav, UA: 1.01 (ref 1.010–1.025)
UROBILINOGEN UA: 0.2 U/dL

## 2017-10-08 NOTE — Progress Notes (Signed)
Subjective:    Patient ID: Alexandra Taylor, female    DOB: 05-18-1950, 67 y.o.   MRN: [id number]  HPI  67 year old Female in for Medicare wellness health maintenance exam and evaluation of medical issues. She would like to see Rheumatologist for arthritis symptoms. In 2015 checked Uric acid and CCP with sed rate. Sed rate was 5 and CCP was negative. Uric acid was normal.C/o hand stiffness and distortion of joints in hands.  History of hypothyroidism, hypertension, osteopenia, attention deficit but not currently on attention deficit medication.  She was diagnosed with hyperthyroidism in 2004 and underwent a hemi-thyroidectomy resulting in hypothyroidism.  Started on antihypertensive medication in 2010.  Is on Prozac for dysthymia.  Colonoscopy done 2008.  Tetanus immunization 2010.  Social history: She is married.  She does not smoke.  At one point seem to have an issue with alcohol consumption but says she has cut back.  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  Family history: Mother with history of hypertension, stroke, MI fractured hip and anemia in her 31s.  Father died at age 53 with history of dementia and had arthritis of his hands.    Review of Systems  Musculoskeletal:       Neck pain  Hand artharlgias   Had Cspine MRI April by Dr. Mardelle Matte orthopedist showing straightening of the normal cervical lordosis.  She was complaining of neck and shoulder pain at the time.  She had mild right foraminal stenosis C3-C4 and a medium size disc osteophyte complex with moderate right and mild left neuroforaminal stenosis C4-C5.  Medium sized disc osteophyte complex with moderate left and mild right foraminal stenosis C5-C6.  Mild spinal stenosis at that same level.  Have ordered bone density study and screening mammogram.  Colonoscopy is due.  Her lab work is reviewed and white blood cell count is 2800.  A year ago it was 3502 years previously 3300.   Currently her MCV is normal.  She is to come back in September to have CBC with differential, B12, folate, and peripheral review of smear.  She may need to see hematologist if this neutropenia is persistent.       Objective:   Physical Exam  Constitutional: She is oriented to person, place, and time. She appears well-developed and well-nourished. No distress.  HENT:  Head: Normocephalic and atraumatic.  Right Ear: External ear normal.  Left Ear: External ear normal.  Mouth/Throat: Oropharynx is clear and moist.  Eyes: Pupils are equal, round, and reactive to light. EOM are normal. Right eye exhibits no discharge. Left eye exhibits no discharge. No scleral icterus.  Neck: Neck supple. No JVD present. No tracheal deviation present. No thyromegaly present.  Cardiovascular: Normal rate, regular rhythm and normal heart sounds. Exam reveals no friction rub.  No murmur heard. Pulmonary/Chest: Effort normal. No stridor. No respiratory distress. She has no wheezes. She has no rales.  Breasts normal female without masses  Abdominal: Soft. She exhibits no distension and no mass. There is no tenderness. There is no guarding.  Genitourinary:  Genitourinary Comments: Deferred to GYN  Musculoskeletal: She exhibits no edema.  Heberden's and Bouchard's nodes  Lymphadenopathy:    She has no cervical adenopathy.  Neurological: She is alert and oriented to person, place, and time. She displays normal reflexes. No cranial nerve deficit. Coordination normal.  Skin: Skin is warm and dry. She is not diaphoretic.  Psychiatric: She has a normal mood and affect. Her  behavior is normal. Judgment and thought content normal.  Vitals reviewed.         Assessment & Plan:  Essential hypertension-stable on current regimen at 110/70  Neutropenia-return in September for follow-up.  Will have repeat CBC, B12, folate and pathology review of smear.  May need to see hematologist if persistent  Neck pain-has seen  orthopedist and had MRI  Hand arthritis-patient request referral to rheumatologist  History depression-continue SSRI  Hypothyroidism status post hemithyroidectomy-continue thyroid replacement  Return in September for follow-up of neutropenia  Subjective:   Patient presents for Medicare Annual/Subsequent preventive examination.  Review Past Medical/Family/Social: See above   Risk Factors  Current exercise habits: Very active with both sailing lessons Dietary issues discussed: Low-fat low carbohydrate  Cardiac risk factors: Family history in mother  Depression Screen  (Note: if answer to either of the following is "Yes", a more complete depression screening is indicated)   Over the past two weeks, have you felt down, depressed or hopeless? No  Over the past two weeks, have you felt little interest or pleasure in doing things? No Have you lost interest or pleasure in daily life? No Do you often feel hopeless? No Do you cry easily over simple problems? No   Activities of Daily Living  In your present state of health, do you have any difficulty performing the following activities?:   Driving? No  Managing money? No  Feeding yourself? No  Getting from bed to chair? No  Climbing a flight of stairs? No  Preparing food and eating?: No  Bathing or showering? No  Getting dressed: No  Getting to the toilet? No  Using the toilet:No  Moving around from place to place: No  In the past year have you fallen or had a near fall?:No  Are you sexually active?  Yes Do you have more than one partner? No   Hearing Difficulties: No  Do you often ask people to speak up or repeat themselves?  Yes Do you experience ringing or noises in your ears?  Yes Do you have difficulty understanding soft or whispered voices?  Yes Do you feel that you have a problem with memory? No Do you often misplace items? No    Home Safety:  Do you have a smoke alarm at your residence? Yes Do you have grab  bars in the bathroom?  No Do you have throw rugs in your house?  No   Cognitive Testing  Alert? Yes Normal Appearance?Yes  Oriented to person? Yes Place? Yes  Time? Yes  Recall of three objects? Yes  Can perform simple calculations? Yes  Displays appropriate judgment?Yes  Can read the correct time from a watch face?Yes   List the Names of Other Physician/Practitioners you currently use:  See referral list for the physicians patient is currently seeing.     Review of Systems: See above   Objective:     General appearance: Appears stated age Head: Normocephalic, without obvious abnormality, atraumatic  Eyes: conj clear, EOMi PEERLA  Ears: normal TM's and external ear canals both ears  Nose: Nares normal. Septum midline. Mucosa normal. No drainage or sinus tenderness.  Throat: lips, mucosa, and tongue normal; teeth and gums normal  Neck: no adenopathy, no carotid bruit, no JVD, supple, symmetrical, trachea midline and thyroid not enlarged, symmetric, no tenderness/mass/nodules  No CVA tenderness.  Lungs: clear to auscultation bilaterally  Breasts: normal appearance, no masses or tenderness Heart: regular rate and rhythm, S1, S2 normal, no murmur, click,  rub or gallop  Abdomen: soft, non-tender; bowel sounds normal; no masses, no organomegaly  Musculoskeletal: ROM normal in all joints, no crepitus, no deformity, Normal muscle strengthen. Back  is symmetric, no curvature. Skin: Skin color, texture, turgor normal. No rashes or lesions  Lymph nodes: Cervical, supraclavicular, and axillary nodes normal.  Neurologic: CN 2 -12 Normal, Normal symmetric reflexes. Normal coordination and gait  Psych: Alert & Oriented x 3, Mood appear stable.    Assessment:    Annual wellness medicare exam   Plan:    During the course of the visit the patient was educated and counseled about appropriate screening and preventive services including:   Pneumococcal 23 given     Patient  Instructions (the written plan) was given to the patient.  Medicare Attestation  I have personally reviewed:  The patient's medical and social history  Their use of alcohol, tobacco or illicit drugs  Their current medications and supplements  The patient's functional ability including ADLs,fall risks, home safety risks, cognitive, and hearing and visual impairment  Diet and physical activities  Evidence for depression or mood disorders  The patient's weight, height, BMI, and visual acuity have been recorded in the chart. I have made referrals, counseling, and provided education to the patient based on review of the above and I have provided the patient with a written personalized care plan for preventive services.

## 2017-10-12 ENCOUNTER — Ambulatory Visit
Admission: RE | Admit: 2017-10-12 | Discharge: 2017-10-12 | Disposition: A | Discharge status: Home / Self Care | Payer: PPO | Source: Ambulatory Visit | Attending: Internal Medicine | Admitting: Internal Medicine

## 2017-10-12 DIAGNOSIS — Z1231 Encounter for screening mammogram for malignant neoplasm of breast: Secondary | ICD-10-CM

## 2017-10-21 ENCOUNTER — Encounter: Payer: Self-pay | Admitting: Internal Medicine

## 2017-10-21 NOTE — Patient Instructions (Addendum)
We will refer you to rheumatologist as you requested.  Continue same antihypertensive medication.  Return in September for follow-up of neutropenia.  Pneumovax 23 given

## 2017-10-31 ENCOUNTER — Encounter: Payer: Self-pay | Admitting: Internal Medicine

## 2017-10-31 DIAGNOSIS — M79642 Pain in left hand: Secondary | ICD-10-CM | POA: Diagnosis not present

## 2017-10-31 DIAGNOSIS — M79641 Pain in right hand: Secondary | ICD-10-CM | POA: Diagnosis not present

## 2017-10-31 DIAGNOSIS — M150 Primary generalized (osteo)arthritis: Secondary | ICD-10-CM | POA: Diagnosis not present

## 2017-10-31 DIAGNOSIS — M79672 Pain in left foot: Secondary | ICD-10-CM | POA: Diagnosis not present

## 2017-10-31 DIAGNOSIS — M2550 Pain in unspecified joint: Secondary | ICD-10-CM | POA: Diagnosis not present

## 2017-10-31 DIAGNOSIS — L409 Psoriasis, unspecified: Secondary | ICD-10-CM | POA: Diagnosis not present

## 2017-10-31 DIAGNOSIS — Z6824 Body mass index (BMI) 24.0-24.9, adult: Secondary | ICD-10-CM | POA: Diagnosis not present

## 2017-10-31 DIAGNOSIS — M79671 Pain in right foot: Secondary | ICD-10-CM | POA: Diagnosis not present

## 2017-11-02 DIAGNOSIS — M5412 Radiculopathy, cervical region: Secondary | ICD-10-CM | POA: Diagnosis not present

## 2017-11-02 DIAGNOSIS — M542 Cervicalgia: Secondary | ICD-10-CM | POA: Diagnosis not present

## 2017-11-09 ENCOUNTER — Other Ambulatory Visit: Payer: PPO | Admitting: Internal Medicine

## 2017-11-09 DIAGNOSIS — D709 Neutropenia, unspecified: Secondary | ICD-10-CM

## 2017-11-10 ENCOUNTER — Other Ambulatory Visit: Payer: Self-pay | Admitting: Internal Medicine

## 2017-11-12 LAB — CBC WITH DIFFERENTIAL/PLATELET
Basophils Absolute: 31 cells/uL (ref 0–200)
Basophils Relative: 1 %
Eosinophils Absolute: 140 cells/uL (ref 15–500)
Eosinophils Relative: 4.5 %
HEMATOCRIT: 34.5 % — AB (ref 35.0–45.0)
Hemoglobin: 11.9 g/dL (ref 11.7–15.5)
Lymphs Abs: 1417 cells/uL (ref 850–3900)
MCH: 33.5 pg — AB (ref 27.0–33.0)
MCHC: 34.5 g/dL (ref 32.0–36.0)
MCV: 97.2 fL (ref 80.0–100.0)
MPV: 10 fL (ref 7.5–12.5)
Monocytes Relative: 12.8 %
NEUTROS ABS: 1116 {cells}/uL — AB (ref 1500–7800)
NEUTROS PCT: 36 %
Platelets: 176 10*3/uL (ref 140–400)
RBC: 3.55 10*6/uL — ABNORMAL LOW (ref 3.80–5.10)
RDW: 12.5 % (ref 11.0–15.0)
Total Lymphocyte: 45.7 %
WBC mixed population: 397 cells/uL (ref 200–950)
WBC: 3.1 10*3/uL — AB (ref 3.8–10.8)

## 2017-11-12 LAB — PATHOLOGIST SMEAR REVIEW

## 2017-11-12 LAB — FOLATE: Folate: 22.3 ng/mL

## 2017-11-12 LAB — VITAMIN B12: VITAMIN B 12: 1532 pg/mL — AB (ref 200–1100)

## 2017-11-27 DIAGNOSIS — M542 Cervicalgia: Secondary | ICD-10-CM | POA: Diagnosis not present

## 2017-11-27 DIAGNOSIS — M5412 Radiculopathy, cervical region: Secondary | ICD-10-CM | POA: Diagnosis not present

## 2017-11-29 ENCOUNTER — Ambulatory Visit (INDEPENDENT_AMBULATORY_CARE_PROVIDER_SITE_OTHER): Payer: PPO | Admitting: Internal Medicine

## 2017-11-29 ENCOUNTER — Encounter: Payer: Self-pay | Admitting: Internal Medicine

## 2017-11-29 VITALS — BP 120/80 | HR 74 | Ht 63.5 in | Wt 140.0 lb

## 2017-11-29 DIAGNOSIS — D709 Neutropenia, unspecified: Secondary | ICD-10-CM

## 2017-11-29 DIAGNOSIS — D649 Anemia, unspecified: Secondary | ICD-10-CM

## 2017-11-30 DIAGNOSIS — M50122 Cervical disc disorder at C5-C6 level with radiculopathy: Secondary | ICD-10-CM | POA: Diagnosis not present

## 2017-11-30 DIAGNOSIS — M5013 Cervical disc disorder with radiculopathy, cervicothoracic region: Secondary | ICD-10-CM | POA: Diagnosis not present

## 2017-11-30 DIAGNOSIS — M50123 Cervical disc disorder at C6-C7 level with radiculopathy: Secondary | ICD-10-CM | POA: Diagnosis not present

## 2017-11-30 DIAGNOSIS — M50121 Cervical disc disorder at C4-C5 level with radiculopathy: Secondary | ICD-10-CM | POA: Diagnosis not present

## 2017-11-30 LAB — IRON,TIBC AND FERRITIN PANEL
%SAT: 33 % (calc) (ref 16–45)
Ferritin: 316 ng/mL — ABNORMAL HIGH (ref 16–288)
Iron: 92 ug/dL (ref 45–160)
TIBC: 279 ug/dL (ref 250–450)

## 2017-11-30 LAB — RETICULOCYTES
ABS RETIC: 49790 {cells}/uL (ref 20000–8000)
Retic Ct Pct: 1.3 %

## 2017-12-03 DIAGNOSIS — M50123 Cervical disc disorder at C6-C7 level with radiculopathy: Secondary | ICD-10-CM | POA: Diagnosis not present

## 2017-12-03 DIAGNOSIS — M50121 Cervical disc disorder at C4-C5 level with radiculopathy: Secondary | ICD-10-CM | POA: Diagnosis not present

## 2017-12-03 DIAGNOSIS — M5013 Cervical disc disorder with radiculopathy, cervicothoracic region: Secondary | ICD-10-CM | POA: Diagnosis not present

## 2017-12-03 DIAGNOSIS — M50122 Cervical disc disorder at C5-C6 level with radiculopathy: Secondary | ICD-10-CM | POA: Diagnosis not present

## 2017-12-11 DIAGNOSIS — Z800 Family history of malignant neoplasm of digestive organs: Secondary | ICD-10-CM | POA: Diagnosis not present

## 2017-12-11 DIAGNOSIS — Z1211 Encounter for screening for malignant neoplasm of colon: Secondary | ICD-10-CM | POA: Diagnosis not present

## 2017-12-11 LAB — HM COLONOSCOPY

## 2017-12-13 ENCOUNTER — Other Ambulatory Visit: Payer: Self-pay | Admitting: Internal Medicine

## 2017-12-14 DIAGNOSIS — M542 Cervicalgia: Secondary | ICD-10-CM | POA: Diagnosis not present

## 2017-12-14 DIAGNOSIS — M5412 Radiculopathy, cervical region: Secondary | ICD-10-CM | POA: Diagnosis not present

## 2017-12-14 DIAGNOSIS — M50123 Cervical disc disorder at C6-C7 level with radiculopathy: Secondary | ICD-10-CM | POA: Diagnosis not present

## 2017-12-16 NOTE — Patient Instructions (Signed)
Appointment to be made with Hematologist regarding persistent low white blood cell count

## 2017-12-16 NOTE — Progress Notes (Signed)
   Subjective:    Patient ID: Alexandra Taylor, female    DOB: 02-13-1951, 67 y.o.   MRN: [id number]  HPI 67 year old Female in today for follow-up of neutropenia.  She was here in August for physical examination.  She has a long-standing history of low white blood cell count dating back to at least 2015 when white blood cell count was 2700 in June 2015.  By September it increased to 3900.  In August 2016 white blood cell count was 3800 and in August 2017 3300.  In August 2018 white blood cell count was 3500.  In August 2019 it was 2800 with a normal hemoglobin and a normal MCV.  In 2018 in 2017 MCV was 101 and 100.5 respectively.  Patient has had issues with alcohol in the past.  However has not been anemic.  In September I had pathology review smear and they noted she had leukopenia due to absolute neutropenia.  Myeloid population consistent are predominantly mature segmented neutrophils.  Red blood cells appeared to be borderline microcytic on review.  Suggested evaluation for iron deficiency which we did subsequently.  She actually has a slightly elevated ferritin of 316.  Iron level 92.  Total iron binding capacity 279.  B12 level was actually elevated at 1532.  Folate level was normal.  Liver functions normal with the exception of slightly elevated SGOT of 37.    Review of Systems see above     Objective:   Physical Exam  Not examined today      Assessment & Plan:  Spent 15 minutes speaking with her about persistent and long-standing neutropenia.  White blood cell count improved from 2802 months ago to 3100.  I do not want to overlook a myelodysplastic process.  She will be referred to hematology for further evaluation.  She understands why I am concerned and is agreeable to seeing Hematologist.

## 2017-12-21 ENCOUNTER — Other Ambulatory Visit: Payer: Self-pay | Admitting: Internal Medicine

## 2017-12-21 NOTE — Telephone Encounter (Signed)
Verbal order by Dr. Renold Genta to refill Prozac 20 mg.  Take 2 capsules by mouth every day.  #180.  Refills 3.  [facility] [place] @ [phone].  Spoke with [employer].

## 2018-01-14 DIAGNOSIS — L578 Other skin changes due to chronic exposure to nonionizing radiation: Secondary | ICD-10-CM | POA: Diagnosis not present

## 2018-01-14 DIAGNOSIS — D229 Melanocytic nevi, unspecified: Secondary | ICD-10-CM | POA: Diagnosis not present

## 2018-01-14 DIAGNOSIS — M542 Cervicalgia: Secondary | ICD-10-CM | POA: Diagnosis not present

## 2018-01-14 DIAGNOSIS — L570 Actinic keratosis: Secondary | ICD-10-CM | POA: Diagnosis not present

## 2018-01-14 DIAGNOSIS — M50123 Cervical disc disorder at C6-C7 level with radiculopathy: Secondary | ICD-10-CM | POA: Diagnosis not present

## 2018-01-14 DIAGNOSIS — L814 Other melanin hyperpigmentation: Secondary | ICD-10-CM | POA: Diagnosis not present

## 2018-01-14 DIAGNOSIS — M5412 Radiculopathy, cervical region: Secondary | ICD-10-CM | POA: Diagnosis not present

## 2018-01-14 DIAGNOSIS — L400 Psoriasis vulgaris: Secondary | ICD-10-CM | POA: Diagnosis not present

## 2018-01-14 DIAGNOSIS — L821 Other seborrheic keratosis: Secondary | ICD-10-CM | POA: Diagnosis not present

## 2018-02-08 ENCOUNTER — Other Ambulatory Visit: Payer: Self-pay | Admitting: Internal Medicine

## 2018-04-22 ENCOUNTER — Telehealth: Payer: Self-pay | Admitting: Internal Medicine

## 2018-04-22 ENCOUNTER — Encounter: Payer: Self-pay | Admitting: Internal Medicine

## 2018-04-22 DIAGNOSIS — Z7721 Contact with and (suspected) exposure to potentially hazardous body fluids: Secondary | ICD-10-CM

## 2018-04-22 MED ORDER — OSELTAMIVIR PHOSPHATE 75 MG PO CAPS: 75.0000 mg | ORAL_CAPSULE | Freq: Every day | ORAL | 0 refills | Status: DC

## 2018-04-22 NOTE — Telephone Encounter (Signed)
Phone call after hours from patient.  She took elderly person to doctor and was diagnosed with flu.  Patient did not have flu shot this season.  Calling Tamiflu 75 mg daily for 5 days as prophylaxis.

## 2018-05-06 DIAGNOSIS — L409 Psoriasis, unspecified: Secondary | ICD-10-CM | POA: Diagnosis not present

## 2018-05-06 DIAGNOSIS — Z6824 Body mass index (BMI) 24.0-24.9, adult: Secondary | ICD-10-CM | POA: Diagnosis not present

## 2018-05-06 DIAGNOSIS — M2550 Pain in unspecified joint: Secondary | ICD-10-CM | POA: Diagnosis not present

## 2018-05-06 DIAGNOSIS — M150 Primary generalized (osteo)arthritis: Secondary | ICD-10-CM | POA: Diagnosis not present

## 2018-08-02 ENCOUNTER — Other Ambulatory Visit: Payer: Self-pay | Admitting: Internal Medicine

## 2018-08-09 DIAGNOSIS — Y92096 Garden or yard of other non-institutional residence as the place of occurrence of the external cause: Secondary | ICD-10-CM | POA: Diagnosis not present

## 2018-08-09 DIAGNOSIS — X501XXA Overexertion from prolonged static or awkward postures, initial encounter: Secondary | ICD-10-CM | POA: Diagnosis not present

## 2018-08-09 DIAGNOSIS — W19XXXA Unspecified fall, initial encounter: Secondary | ICD-10-CM | POA: Diagnosis not present

## 2018-08-09 DIAGNOSIS — S93401A Sprain of unspecified ligament of right ankle, initial encounter: Secondary | ICD-10-CM | POA: Diagnosis not present

## 2018-10-04 DIAGNOSIS — G43109 Migraine with aura, not intractable, without status migrainosus: Secondary | ICD-10-CM | POA: Diagnosis not present

## 2018-10-04 DIAGNOSIS — H2513 Age-related nuclear cataract, bilateral: Secondary | ICD-10-CM | POA: Diagnosis not present

## 2018-10-04 DIAGNOSIS — H35363 Drusen (degenerative) of macula, bilateral: Secondary | ICD-10-CM | POA: Diagnosis not present

## 2018-10-10 ENCOUNTER — Other Ambulatory Visit: Payer: Self-pay

## 2018-10-10 ENCOUNTER — Other Ambulatory Visit: Payer: PPO | Admitting: Internal Medicine

## 2018-10-10 DIAGNOSIS — I10 Essential (primary) hypertension: Secondary | ICD-10-CM

## 2018-10-10 DIAGNOSIS — E039 Hypothyroidism, unspecified: Secondary | ICD-10-CM

## 2018-10-10 DIAGNOSIS — Z0000 Encounter for general adult medical examination without abnormal findings: Secondary | ICD-10-CM | POA: Diagnosis not present

## 2018-10-10 DIAGNOSIS — M4802 Spinal stenosis, cervical region: Secondary | ICD-10-CM | POA: Diagnosis not present

## 2018-10-10 DIAGNOSIS — M19049 Primary osteoarthritis, unspecified hand: Secondary | ICD-10-CM

## 2018-10-10 LAB — LIPID PANEL
Cholesterol: 220 mg/dL — ABNORMAL HIGH (ref <200)
HDL: 120 mg/dL (ref >=50)
LDL Cholesterol (Calc): 89 mg/dL (calc)
Non-HDL Cholesterol (Calc): 100 mg/dL (calc) (ref <130)
Total CHOL/HDL Ratio: 1.8 (calc) (ref <5.0)
Triglycerides: 38 mg/dL (ref <150)

## 2018-10-10 LAB — CBC WITH DIFFERENTIAL/PLATELET
Absolute Monocytes: 300 cells/uL (ref 200–950)
Basophils Absolute: 40 cells/uL (ref 0–200)
Basophils Relative: 1.2 %
Eosinophils Absolute: 158 cells/uL (ref 15–500)
Eosinophils Relative: 4.8 %
HCT: 39.7 % (ref 35.0–45.0)
Hemoglobin: 13.7 g/dL (ref 11.7–15.5)
Lymphs Abs: 1251 cells/uL (ref 850–3900)
MCH: 35.8 pg — ABNORMAL HIGH (ref 27.0–33.0)
MCHC: 34.5 g/dL (ref 32.0–36.0)
MCV: 103.7 fL — ABNORMAL HIGH (ref 80.0–100.0)
MPV: 10.1 fL (ref 7.5–12.5)
Monocytes Relative: 9.1 %
Neutro Abs: 1551 cells/uL (ref 1500–7800)
Neutrophils Relative %: 47 %
Platelets: 171 10*3/uL (ref 140–400)
RBC: 3.83 10*6/uL (ref 3.80–5.10)
RDW: 12.7 % (ref 11.0–15.0)
Total Lymphocyte: 37.9 %
WBC: 3.3 10*3/uL — ABNORMAL LOW (ref 3.8–10.8)

## 2018-10-10 LAB — COMPLETE METABOLIC PANEL WITH GFR
AG Ratio: 1.8 (calc) (ref 1.0–2.5)
ALT: 23 U/L (ref 6–29)
AST: 38 U/L — ABNORMAL HIGH (ref 10–35)
Albumin: 4.3 g/dL (ref 3.6–5.1)
Alkaline phosphatase (APISO): 103 U/L (ref 37–153)
BUN: 13 mg/dL (ref 7–25)
CO2: 26 mmol/L (ref 20–32)
Calcium: 9.3 mg/dL (ref 8.6–10.4)
Chloride: 101 mmol/L (ref 98–110)
Creat: 0.78 mg/dL (ref 0.50–0.99)
GFR, Est African American: 91 mL/min/{1.73_m2} (ref >=60)
GFR, Est Non African American: 79 mL/min/{1.73_m2} (ref >=60)
Globulin: 2.4 g/dL (calc) (ref 1.9–3.7)
Glucose, Bld: 71 mg/dL (ref 65–99)
Potassium: 4.6 mmol/L (ref 3.5–5.3)
Sodium: 135 mmol/L (ref 135–146)
Total Bilirubin: 0.7 mg/dL (ref 0.2–1.2)
Total Protein: 6.7 g/dL (ref 6.1–8.1)

## 2018-10-10 LAB — TSH: TSH: 1.1 mIU/L (ref 0.40–4.50)

## 2018-10-11 ENCOUNTER — Ambulatory Visit (INDEPENDENT_AMBULATORY_CARE_PROVIDER_SITE_OTHER): Payer: PPO | Admitting: Internal Medicine

## 2018-10-11 ENCOUNTER — Encounter: Payer: Self-pay | Admitting: Internal Medicine

## 2018-10-11 VITALS — BP 110/80 | Ht 64.0 in | Wt 140.0 lb

## 2018-10-11 DIAGNOSIS — Z20828 Contact with and (suspected) exposure to other viral communicable diseases: Secondary | ICD-10-CM

## 2018-10-11 DIAGNOSIS — R7989 Other specified abnormal findings of blood chemistry: Secondary | ICD-10-CM

## 2018-10-11 DIAGNOSIS — E039 Hypothyroidism, unspecified: Secondary | ICD-10-CM

## 2018-10-11 DIAGNOSIS — D708 Other neutropenia: Secondary | ICD-10-CM | POA: Diagnosis not present

## 2018-10-11 DIAGNOSIS — R8290 Unspecified abnormal findings in urine: Secondary | ICD-10-CM

## 2018-10-11 DIAGNOSIS — H903 Sensorineural hearing loss, bilateral: Secondary | ICD-10-CM

## 2018-10-11 DIAGNOSIS — Z0000 Encounter for general adult medical examination without abnormal findings: Secondary | ICD-10-CM | POA: Diagnosis not present

## 2018-10-11 DIAGNOSIS — Z20822 Contact with and (suspected) exposure to covid-19: Secondary | ICD-10-CM

## 2018-10-11 DIAGNOSIS — Z8659 Personal history of other mental and behavioral disorders: Secondary | ICD-10-CM

## 2018-10-11 DIAGNOSIS — M19049 Primary osteoarthritis, unspecified hand: Secondary | ICD-10-CM | POA: Diagnosis not present

## 2018-10-11 LAB — POCT URINALYSIS DIPSTICK
Appearance: NEGATIVE
Bilirubin, UA: NEGATIVE
Blood, UA: NEGATIVE
Glucose, UA: NEGATIVE
Ketones, UA: NEGATIVE
Nitrite, UA: NEGATIVE
Odor: NEGATIVE
Protein, UA: NEGATIVE
Spec Grav, UA: 1.015 (ref 1.010–1.025)
Urobilinogen, UA: 0.2 E.U./dL
pH, UA: 6.5 (ref 5.0–8.0)

## 2018-10-11 NOTE — Progress Notes (Signed)
Subjective:    Patient ID: Alexandra Taylor, female    DOB: 06/26/1950, 68 y.o.   MRN: [id number]  HPI 68 year old Female for Medicare wellness, health maintenance exam and evaluation of medical issues.  Saw a rheumatologist last year and was diagnosed with polyarthralgias and osteoarthritis.  CCP was normal.  It was felt that she had osteoarthritis.  History of hypothyroidism hypertension osteopenia.  She was diagnosed with hyperthyroidism in 2004.  She underwent a hemithyroidectomy resulting in hypothyroidism.  Started on antihypertensive medication in 2010.  Is on Prozac for dysthymia.  Had colonoscopy October 2019.  3D mammogram August 2019.  Social history: She is married.  Does not smoke. Denies issues with alcohol.  Family History: Mother with history of hypertension, stroke, MI, fractured hip and anemia.  Father died at age 56 with history of dementia and had arthritis of his hands.  Review of Systems- no new complaints Saw Dr Mardelle Matte April 2019 and had Neck pain evaluated with MRI. Had finding c/w osteoarthritis.  Hx benign neutropenia.  Wants Covid antibody testing  History of chronic benign neutropenia which is followed.  Peripheral review of smear in the past has been unremarkable.    Objective:   Physical Exam Vital signs reviewed.  Skin warm and dry.  Nodes none.  TMs and pharynx are clear.  Neck is supple.  No adenopathy.  No thyromegaly.  Chest clear.  Cardiac exam regular rate and rhythm.  Abdomen: No hepatosplenomegaly masses or tenderness.  No lower extremity edema.  Neuro no focal deficits.  Pap deferred due to age.       Assessment & Plan:  Macrocytosis. Have checked for B12 and folate deficiency previously. Recommend B complex  Benign neutropenia-has been evaluated previously by peripheral review of smear.  Continues to be stable.  High HDL  Hypothyroidism-acquired after hemithyroidectomy.  TSH is stable on thyroid replacement medication  History of  hand arthritis evaluated by rheumatologist  History of neck pain evaluated by orthopedist with MRI and found to have osteoarthritis in cervical spine  Essential hypertension-stable on current regimen  History of depression continue SSRI  Plan: Return in 1 year or as needed.  Have flu vaccine in the near future.  Subjective:   Patient presents for Medicare Annual/Subsequent preventive examination.  Review Past Medical/Family/Social: See above  Risk Factors  Current exercise habits: Exercises Dietary issues discussed: Low-fat low carbohydrate  Cardiac risk factors: Family history mother  Depression Screen  (Note: if answer to either of the following is "Yes", a more complete depression screening is indicated)   Over the past two weeks, have you felt down, depressed or hopeless? No  Over the past two weeks, have you felt little interest or pleasure in doing things? No Have you lost interest or pleasure in daily life? No Do you often feel hopeless? No Do you cry easily over simple problems? No   Activities of Daily Living  In your present state of health, do you have any difficulty performing the following activities?:   Driving? No  Managing money? No  Feeding yourself? No  Getting from bed to chair? No  Climbing a flight of stairs? No  Preparing food and eating?: No  Bathing or showering? No  Getting dressed: No  Getting to the toilet? No  Using the toilet:No  Moving around from place to place: No  In the past year have you fallen or had a near fall?:yes Are you sexually active? yes Do you have more  than one partner? No   Hearing Difficulties: Yes Do you often ask people to speak up or repeat themselves?  Yes Do you experience ringing or noises in your ears?  Yes Do you have difficulty understanding soft or whispered voices?  Yes Do you feel that you have a problem with memory? No Do you often misplace items? No    Home Safety:  Do you have a smoke alarm at  your residence? Yes Do you have grab bars in the bathroom?  None Do you have throw rugs in your house?  Yes   Cognitive Testing  Alert? Yes Normal Appearance?Yes  Oriented to person? Yes Place? Yes  Time? Yes  Recall of three objects? Yes  Can perform simple calculations? Yes  Displays appropriate judgment?Yes  Can read the correct time from a watch face?Yes   List the Names of Other Physician/Practitioners you currently use:  See referral list for the physicians patient is currently seeing.     Review of Systems: See above   Objective:     General appearance: Appears stated age  Head: Normocephalic, without obvious abnormality, atraumatic  Eyes: conj clear, EOMi PEERLA  Ears: normal TM's and external ear canals both ears  Nose: Nares normal. Septum midline. Mucosa normal. No drainage or sinus tenderness.  Throat: lips, mucosa, and tongue normal; teeth and gums normal  Neck: no adenopathy, no carotid bruit, no JVD, supple, symmetrical, trachea midline and thyroid not enlarged, symmetric, no tenderness/mass/nodules  No CVA tenderness.  Lungs: clear to auscultation bilaterally  Breasts: normal Heart: regular rate and rhythm, S1, S2 normal, no murmur, click, rub or gallop  Abdomen: soft, non-tender; bowel sounds normal; no masses, no organomegaly  Musculoskeletal: ROM normal in all joints, no crepitus, no deformity, Normal muscle strengthen. Back  is symmetric, no curvature. Skin: Skin color, texture, turgor normal. No rashes or lesions  Lymph nodes: Cervical, supraclavicular, and axillary nodes normal.  Neurologic: CN 2 -12 Normal, Normal symmetric reflexes. Normal coordination and gait  Psych: Alert & Oriented x 3, Mood appear stable.    Assessment:    Annual wellness medicare exam   Plan:    During the course of the visit the patient was educated and counseled about appropriate screening and preventive services including:    Annual mammogram  Annual flu vaccine     Patient Instructions (the written plan) was given to the patient.  Medicare Attestation  I have personally reviewed:  The patient's medical and social history  Their use of alcohol, tobacco or illicit drugs  Their current medications and supplements  The patient's functional ability including ADLs,fall risks, home safety risks, cognitive, and hearing and visual impairment  Diet and physical activities  Evidence for depression or mood disorders  The patient's weight, height, BMI, and visual acuity have been recorded in the chart. I have made referrals, counseling, and provided education to the patient based on review of the above and I have provided the patient with a written personalized care plan for preventive services.

## 2018-10-12 LAB — SAR COV2 SEROLOGY (COVID19)AB(IGG),IA: SARS CoV2 AB IGG: NEGATIVE

## 2018-10-13 LAB — URINE CULTURE
MICRO NUMBER:: 773122
SPECIMEN QUALITY:: ADEQUATE

## 2018-10-14 ENCOUNTER — Encounter: Payer: Self-pay | Admitting: Internal Medicine

## 2018-10-23 ENCOUNTER — Encounter: Payer: Self-pay | Admitting: Internal Medicine

## 2018-10-23 NOTE — Patient Instructions (Addendum)
It was a pleasure to see you today.  Continue current medications and follow-up in 1 year.  Have flu vaccine.  COVID-19 antibody is negative.

## 2018-10-25 ENCOUNTER — Ambulatory Visit (INDEPENDENT_AMBULATORY_CARE_PROVIDER_SITE_OTHER): Payer: PPO | Admitting: Internal Medicine

## 2018-10-25 ENCOUNTER — Encounter: Payer: Self-pay | Admitting: Internal Medicine

## 2018-10-25 ENCOUNTER — Other Ambulatory Visit: Payer: Self-pay

## 2018-10-25 DIAGNOSIS — Z23 Encounter for immunization: Secondary | ICD-10-CM | POA: Diagnosis not present

## 2018-10-25 NOTE — Progress Notes (Signed)
Flu vaccine given by CMA 

## 2018-10-25 NOTE — Patient Instructions (Signed)
Patient received a flu shot, L deltoid.

## 2018-10-29 ENCOUNTER — Other Ambulatory Visit: Payer: Self-pay | Admitting: Internal Medicine

## 2018-12-15 ENCOUNTER — Other Ambulatory Visit: Payer: Self-pay | Admitting: Internal Medicine

## 2019-03-11 ENCOUNTER — Other Ambulatory Visit: Payer: Self-pay

## 2019-03-11 ENCOUNTER — Telehealth: Payer: Self-pay | Admitting: Internal Medicine

## 2019-03-11 MED ORDER — LEVOTHYROXINE SODIUM 50 MCG PO TABS: ORAL_TABLET | ORAL | 3 refills | Status: DC

## 2019-03-11 MED ORDER — FLUOXETINE HCL 20 MG PO CAPS: 40.0000 mg | ORAL_CAPSULE | Freq: Every day | ORAL | 1 refills | Status: DC

## 2019-03-11 NOTE — Telephone Encounter (Signed)
Patient needs refill on FLUoxetine (PROZAC) 20 MG capsule

## 2019-04-03 DIAGNOSIS — D1801 Hemangioma of skin and subcutaneous tissue: Secondary | ICD-10-CM | POA: Diagnosis not present

## 2019-04-03 DIAGNOSIS — D225 Melanocytic nevi of trunk: Secondary | ICD-10-CM | POA: Diagnosis not present

## 2019-04-03 DIAGNOSIS — L570 Actinic keratosis: Secondary | ICD-10-CM | POA: Diagnosis not present

## 2019-04-03 DIAGNOSIS — L400 Psoriasis vulgaris: Secondary | ICD-10-CM | POA: Diagnosis not present

## 2019-04-03 DIAGNOSIS — L814 Other melanin hyperpigmentation: Secondary | ICD-10-CM | POA: Diagnosis not present

## 2019-04-03 DIAGNOSIS — L988 Other specified disorders of the skin and subcutaneous tissue: Secondary | ICD-10-CM | POA: Diagnosis not present

## 2019-04-03 DIAGNOSIS — L821 Other seborrheic keratosis: Secondary | ICD-10-CM | POA: Diagnosis not present

## 2019-04-18 ENCOUNTER — Other Ambulatory Visit: Payer: Self-pay | Admitting: Internal Medicine

## 2019-04-30 DIAGNOSIS — M25512 Pain in left shoulder: Secondary | ICD-10-CM | POA: Diagnosis not present

## 2019-05-28 DIAGNOSIS — M25512 Pain in left shoulder: Secondary | ICD-10-CM | POA: Diagnosis not present

## 2019-05-29 DIAGNOSIS — L819 Disorder of pigmentation, unspecified: Secondary | ICD-10-CM | POA: Diagnosis not present

## 2019-05-29 DIAGNOSIS — L2084 Intrinsic (allergic) eczema: Secondary | ICD-10-CM | POA: Diagnosis not present

## 2019-05-29 DIAGNOSIS — L814 Other melanin hyperpigmentation: Secondary | ICD-10-CM | POA: Diagnosis not present

## 2019-07-14 ENCOUNTER — Telehealth: Payer: Self-pay | Admitting: Internal Medicine

## 2019-07-14 NOTE — Telephone Encounter (Signed)
Pt calling because she was bitten by a tick on back and wasn't sure what she should do, She said that they removed it and didn't see any left in there. Pt is out of town as well, please advise

## 2019-07-14 NOTE — Telephone Encounter (Signed)
Keep area clean. May apply peroxide and OTC anitbiotic ointment like Neosporin. Call if develops fever, headache ,chills as these could be signs of [facility] Spotted Fever and should occur within 10 days of bite. Lyme disease not seen as often in [place] as [facility]. Signs of Lyme disease are weird rash and arthralgias later than 10 days.

## 2019-07-15 NOTE — Telephone Encounter (Signed)
Left detailed message.   

## 2019-10-06 DIAGNOSIS — F1020 Alcohol dependence, uncomplicated: Secondary | ICD-10-CM | POA: Diagnosis not present

## 2019-10-10 ENCOUNTER — Other Ambulatory Visit: Payer: Self-pay

## 2019-10-10 ENCOUNTER — Other Ambulatory Visit: Payer: PPO | Admitting: Internal Medicine

## 2019-10-10 ENCOUNTER — Ambulatory Visit (INDEPENDENT_AMBULATORY_CARE_PROVIDER_SITE_OTHER): Payer: PPO | Admitting: Otolaryngology

## 2019-10-10 ENCOUNTER — Encounter (INDEPENDENT_AMBULATORY_CARE_PROVIDER_SITE_OTHER): Payer: Self-pay | Admitting: Otolaryngology

## 2019-10-10 VITALS — Temp 97.7°F

## 2019-10-10 DIAGNOSIS — E039 Hypothyroidism, unspecified: Secondary | ICD-10-CM

## 2019-10-10 DIAGNOSIS — M19049 Primary osteoarthritis, unspecified hand: Secondary | ICD-10-CM | POA: Diagnosis not present

## 2019-10-10 DIAGNOSIS — H6123 Impacted cerumen, bilateral: Secondary | ICD-10-CM | POA: Diagnosis not present

## 2019-10-10 DIAGNOSIS — F329 Major depressive disorder, single episode, unspecified: Secondary | ICD-10-CM | POA: Diagnosis not present

## 2019-10-10 DIAGNOSIS — H903 Sensorineural hearing loss, bilateral: Secondary | ICD-10-CM

## 2019-10-10 DIAGNOSIS — F419 Anxiety disorder, unspecified: Secondary | ICD-10-CM | POA: Diagnosis not present

## 2019-10-10 DIAGNOSIS — M4802 Spinal stenosis, cervical region: Secondary | ICD-10-CM | POA: Diagnosis not present

## 2019-10-10 DIAGNOSIS — E785 Hyperlipidemia, unspecified: Secondary | ICD-10-CM | POA: Diagnosis not present

## 2019-10-10 DIAGNOSIS — Z0000 Encounter for general adult medical examination without abnormal findings: Secondary | ICD-10-CM | POA: Diagnosis not present

## 2019-10-10 LAB — COMPLETE METABOLIC PANEL WITH GFR
AG Ratio: 1.6 (calc) (ref 1.0–2.5)
ALT: 23 U/L (ref 6–29)
AST: 42 U/L — ABNORMAL HIGH (ref 10–35)
Albumin: 4.2 g/dL (ref 3.6–5.1)
Alkaline phosphatase (APISO): 132 U/L (ref 37–153)
BUN: 13 mg/dL (ref 7–25)
CO2: 27 mmol/L (ref 20–32)
Calcium: 9.6 mg/dL (ref 8.6–10.4)
Chloride: 97 mmol/L — ABNORMAL LOW (ref 98–110)
Creat: 0.93 mg/dL (ref 0.50–0.99)
GFR, Est African American: 73 mL/min/{1.73_m2} (ref >=60)
GFR, Est Non African American: 63 mL/min/{1.73_m2} (ref >=60)
Globulin: 2.7 g/dL (calc) (ref 1.9–3.7)
Glucose, Bld: 74 mg/dL (ref 65–99)
Potassium: 4.9 mmol/L (ref 3.5–5.3)
Sodium: 131 mmol/L — ABNORMAL LOW (ref 135–146)
Total Bilirubin: 0.8 mg/dL (ref 0.2–1.2)
Total Protein: 6.9 g/dL (ref 6.1–8.1)

## 2019-10-10 LAB — CBC WITH DIFFERENTIAL/PLATELET
Absolute Monocytes: 321 cells/uL (ref 200–950)
Basophils Absolute: 30 cells/uL (ref 0–200)
Basophils Relative: 1 %
Eosinophils Absolute: 129 cells/uL (ref 15–500)
Eosinophils Relative: 4.3 %
HCT: 40.6 % (ref 35.0–45.0)
Hemoglobin: 13.9 g/dL (ref 11.7–15.5)
Lymphs Abs: 1290 cells/uL (ref 850–3900)
MCH: 34.4 pg — ABNORMAL HIGH (ref 27.0–33.0)
MCHC: 34.2 g/dL (ref 32.0–36.0)
MCV: 100.5 fL — ABNORMAL HIGH (ref 80.0–100.0)
MPV: 10.4 fL (ref 7.5–12.5)
Monocytes Relative: 10.7 %
Neutro Abs: 1230 cells/uL — ABNORMAL LOW (ref 1500–7800)
Neutrophils Relative %: 41 %
Platelets: 182 10*3/uL (ref 140–400)
RBC: 4.04 10*6/uL (ref 3.80–5.10)
RDW: 12.7 % (ref 11.0–15.0)
Total Lymphocyte: 43 %
WBC: 3 10*3/uL — ABNORMAL LOW (ref 3.8–10.8)

## 2019-10-10 LAB — LIPID PANEL
Cholesterol: 249 mg/dL — ABNORMAL HIGH (ref <200)
HDL: 146 mg/dL (ref >=50)
LDL Cholesterol (Calc): 88 mg/dL (calc)
Non-HDL Cholesterol (Calc): 103 mg/dL (calc) (ref <130)
Total CHOL/HDL Ratio: 1.7 (calc) (ref <5.0)
Triglycerides: 64 mg/dL (ref <150)

## 2019-10-10 LAB — TSH: TSH: 1.78 mIU/L (ref 0.40–4.50)

## 2019-10-10 NOTE — Progress Notes (Signed)
HPI: Alexandra Taylor is a 69 y.o. female who presents for evaluation of earwax buildup in her ears.  She was last seen here about 2 and half years ago.  She has hearing aids that she obtained from [employer] about 3 to 4 years ago.  She is not totally satisfied with the hearing aids.  She wanted to get her ears checked and cleaned.  Past Medical History:  Diagnosis Date  . Hyperlipidemia   . Hypertension   . Osteopenia    Past Surgical History:  Procedure Laterality Date  . hemithyroidectomy     Social History   Socioeconomic History  . Marital status: Married    Spouse name: Not on file  . Number of children: Not on file  . Years of education: Not on file  . Highest education level: Not on file  Occupational History  . Not on file  Tobacco Use  . Smoking status: Former Smoker    Packs/day: 1.00    Years: 15.00    Pack years: 15.00    Types: Cigarettes    Quit date: 02/28/1984    Years since quitting: 35.6  . Smokeless tobacco: Never Used  Substance and Sexual Activity  . Alcohol use: Yes  . Drug use: No  . Sexual activity: Yes  Other Topics Concern  . Not on file  Social History Narrative  . Not on file   Social Determinants of Health   Financial Resource Strain:   . Difficulty of Paying Living Expenses:   Food Insecurity:   . Worried About Charity fundraiser in the Last Year:   . Arboriculturist in the Last Year:   Transportation Needs:   . Film/video editor (Medical):   Marland Kitchen Lack of Transportation (Non-Medical):   Physical Activity:   . Days of Exercise per Week:   . Minutes of Exercise per Session:   Stress:   . Feeling of Stress :   Social Connections:   . Frequency of Communication with Friends and Family:   . Frequency of Social Gatherings with Friends and Family:   . Attends Religious Services:   . Active Member of Clubs or Organizations:   . Attends Archivist Meetings:   Marland Kitchen Marital Status:    Family History  Problem Relation Age of  Onset  . Hypertension Mother    No Known Allergies Prior to Admission medications   Medication Sig Start Date End Date Taking? Authorizing Provider  b complex vitamins tablet Take 1 tablet by mouth daily.   Yes [provider]  BIOTIN 5000 PO Take by mouth.   Yes [provider]  fish oil-omega-3 fatty acids 1000 MG capsule Take 2 g by mouth daily.     Yes [provider]  FLUoxetine (PROZAC) 20 MG capsule TAKE 2 CAPSULES BY MOUTH EVERY DAY 04/18/19  Yes Baxley, Cresenciano Lick, MD  fluticasone (FLONASE) 50 MCG/ACT nasal spray Place 1 spray into both nostrils as needed. 07/17/17  Yes [provider]  levothyroxine (SYNTHROID) 50 MCG tablet TAKE 1 TABLET BY MOUTH EVERY DAY BEFORE BREAKFAST 03/11/19  Yes Baxley, Cresenciano Lick, MD  Probiotic Product (PROBIOTIC PO) Take 1 tablet by mouth daily.   Yes [provider]  ramipril (ALTACE) 10 MG capsule TAKE 1 CAPSULE BY MOUTH EVERY DAY 12/15/18  Yes Baxley, Cresenciano Lick, MD  oseltamivir (TAMIFLU) 75 MG capsule Take 1 capsule (75 mg total) by mouth daily. 04/22/18   Elby Showers, MD  triamcinolone cream (KENALOG) 0.1 % Apply 1 application topically 3 (three) times daily. 06/25/17   Elby Showers, MD     Positive ROS: Otherwise negative  All other systems have been reviewed and were otherwise negative with the exception of those mentioned in the HPI and as above.  Physical Exam: Constitutional: Alert, well-appearing, no acute distress Ears: External ears without lesions or tenderness. Ear canals with only mild buildup of wax in both ear canals that was really nonobstructing.  Ear canals were cleaned with curettes.  TMs were clear bilaterally with no middle ear or TM abnormality noted.. Nasal: External nose without lesions. Clear nasal passages Oral: Oropharynx clear. Neck: No palpable adenopathy or masses Respiratory: Breathing comfortably  Skin: No facial/neck lesions or rash noted.  Cerumen impaction removal  Date/Time:  10/10/2019 4:13 PM Performed by: Rozetta Nunnery, MD Authorized by: Rozetta Nunnery, MD   Consent:    Consent obtained:  Verbal   Consent given by:  Patient   Risks discussed:  Pain and bleeding Procedure details:    Location:  L ear and R ear   Procedure type: curette   Post-procedure details:    Inspection:  TM intact and canal normal   Hearing quality:  Improved   Patient tolerance of procedure:  Tolerated well, no immediate complications Comments:     TMs are clear bilaterally.    Assessment: Bilateral sensorineural hearing loss Minimal wax buildup.  Plan: Ear canals were cleaned in the office.  I suggested follow-up with [employer] concerning the hearing aids.  Also gave the [facility] of [facility] as another option to discuss possible hearing aid use with.  Radene Journey, MD

## 2019-10-14 ENCOUNTER — Ambulatory Visit (INDEPENDENT_AMBULATORY_CARE_PROVIDER_SITE_OTHER): Payer: PPO | Admitting: Internal Medicine

## 2019-10-14 ENCOUNTER — Other Ambulatory Visit: Payer: Self-pay

## 2019-10-14 ENCOUNTER — Encounter: Payer: Self-pay | Admitting: Internal Medicine

## 2019-10-14 VITALS — BP 140/88 | Ht 63.0 in | Wt 145.0 lb

## 2019-10-14 DIAGNOSIS — Z0000 Encounter for general adult medical examination without abnormal findings: Secondary | ICD-10-CM

## 2019-10-14 DIAGNOSIS — D708 Other neutropenia: Secondary | ICD-10-CM

## 2019-10-14 DIAGNOSIS — Z23 Encounter for immunization: Secondary | ICD-10-CM

## 2019-10-14 DIAGNOSIS — E039 Hypothyroidism, unspecified: Secondary | ICD-10-CM

## 2019-10-14 DIAGNOSIS — Z8659 Personal history of other mental and behavioral disorders: Secondary | ICD-10-CM

## 2019-10-14 DIAGNOSIS — R7989 Other specified abnormal findings of blood chemistry: Secondary | ICD-10-CM

## 2019-10-14 DIAGNOSIS — R7401 Elevation of levels of liver transaminase levels: Secondary | ICD-10-CM

## 2019-10-14 DIAGNOSIS — H903 Sensorineural hearing loss, bilateral: Secondary | ICD-10-CM

## 2019-10-14 LAB — POCT URINALYSIS DIPSTICK
Appearance: NEGATIVE
Bilirubin, UA: NEGATIVE
Blood, UA: NEGATIVE
Glucose, UA: NEGATIVE
Ketones, UA: NEGATIVE
Leukocytes, UA: NEGATIVE
Nitrite, UA: NEGATIVE
Odor: NEGATIVE
Protein, UA: NEGATIVE
Spec Grav, UA: 1.01 (ref 1.010–1.025)
Urobilinogen, UA: 0.2 E.U./dL
pH, UA: 6.5 (ref 5.0–8.0)

## 2019-10-14 NOTE — Patient Instructions (Signed)
See Audiologist about hearing. Tdap given. RTC one year or as needed. Referring to Pulmonary CT with hx of smoking.

## 2019-10-14 NOTE — Progress Notes (Signed)
Subjective:    Patient ID: Alexandra Taylor, female    DOB: Sep 18, 1950, 69 y.o.   MRN: [id number]  HPI  69 year old Female for health maintenance exam and evaluation of medical issues.   Has seen rheumatologist in 2019 and was diagnosed with polyarthralgias and osteoarthritis.  CCP was normal.  History of hypothyroidism, hypertension and osteopenia.  Was diagnosed with hyperthyroidism in 2004 and underwent a hemithyroidectomy resulting in hypothyroidism.  She is on levothyroxine 50 mcg daily.  Was started on antihypertensive medication in 2010.  Takes Prozac for dysthymia.  A colonoscopy in October 2019.  Had mammogram in 2019.  Needs to have mammogram in the near future.  Social history: She is married.  Does not smoke.  At one point had some issues with alcohol consumption but denies that now.  Family history: Mother with history of hypertension, stroke, MI fractured hip and anemia.  Father died at age 21 with history of dementia and had arthritis of his hands.  In 2019 patient saw Dr. Mardelle Matte for evaluation of neck pain.  She had an MRI and findings were consistent with osteoarthritis.  History of benign neutropenia.  This is followed.  Peripheral smears in the past have been unremarkable.   Colonoscopy in 2019 and essentially normal. Due for mammogram  Review of Systems no new complaints     Objective:   Physical Exam  Blood pressure 140/88 BMI 25.69 weight 145 pounds height 5 feet 3 inches  Skin warm and dry.  Nodes none.  TMs and pharynx are clear.  Neck is supple without thyromegaly or adenopathy.  Chest is clear to auscultation without rales or wheezing.  Cardiac exam: Regular rate and rhythm normal S1 and S2 without murmurs or gallops.  Abdomen: Soft nondistended without hepatosplenomegaly masses or tenderness.  No lower extremity edema.  Neuro no focal deficits.      Assessment & Plan:  She has mild macrocytosis with MCV 100.5.  This is better than last year  when it was 1 and 3.7  Benign neutropenia with white blood cell count of 3000  Mild elevation of SGOT at 42  Hypothyroidism TSH is normal at 1.78  Total cholesterol is elevated at 249 but her HDL cholesterol is 146.  Triglycerides are normal at 64 and LDL is 88.  Given her mild macrocytosis and mild elevation of SGOT I think she probably still consumes some alcohol.  She will continue with thyroid replacement therapy.  Her HDL is extremely high and likely inherited.  She does not need to be on lipid-lowering medication.  Recommend multivitamin containing B vitamins.  Follow-up in 1 year or as needed.  Repeat colonoscopy due in 2029.  Needs annual mammogram.  Recommend COVID-19 vaccine.  Has had 2 pneumococcal immunizations and tetanus immunization update given today.  Subjective:   Patient presents for Medicare Annual/Subsequent preventive examination.  Review Past Medical/Family/Social: See above   Risk Factors  Current exercise habits: Does some exercising Dietary issues discussed: Low-fat low carbohydrate  Cardiac risk factors: Family history of mother  Depression Screen  (Note: if answer to either of the following is "Yes", a more complete depression screening is indicated)   Over the past two weeks, have you felt down, depressed or hopeless? No  Over the past two weeks, have you felt little interest or pleasure in doing things? No Have you lost interest or pleasure in daily life? No Do you often feel hopeless? No Do you cry easily over simple problems?  No   Activities of Daily Living  In your present state of health, do you have any difficulty performing the following activities?:   Driving? No  Managing money? No  Feeding yourself? No  Getting from bed to chair? No  Climbing a flight of stairs? No  Preparing food and eating?: No  Bathing or showering? No  Getting dressed: No  Getting to the toilet? No  Using the toilet:No  Moving around from place to place: No  In  the past year have you fallen or had a near fall?:No  Are you sexually active?  Yes Do you have more than one partner? No   Hearing Difficulties: Yes-needs to see audiologist Do you often ask people to speak up or repeat themselves?  Yes Do you experience ringing or noises in your ears?  Yes Do you have difficulty understanding soft or whispered voices?  Yes Do you feel that you have a problem with memory? No Do you often misplace items? No    Home Safety:  Do you have a smoke alarm at your residence? Yes Do you have grab bars in the bathroom?  No Do you have throw rugs in your house?  No   Cognitive Testing  Alert? Yes Normal Appearance?Yes  Oriented to person? Yes Place? Yes  Time? Yes  Recall of three objects? Yes  Can perform simple calculations? Yes  Displays appropriate judgment?Yes  Can read the correct time from a watch face?Yes   List the Names of Other Physician/Practitioners you currently use:  See referral list for the physicians patient is currently seeing.     Review of Systems: See above   Objective:     General appearance: Appears younger than stated age and trim Head: Normocephalic, without obvious abnormality, atraumatic  Eyes: conj clear, EOMi PEERLA  Ears: normal TM's and external ear canals both ears  Nose: Nares normal. Septum midline. Mucosa normal. No drainage or sinus tenderness.  Throat: lips, mucosa, and tongue normal; teeth and gums normal  Neck: no adenopathy, no carotid bruit, no JVD, supple, symmetrical, trachea midline and thyroid not enlarged, symmetric, no tenderness/mass/nodules  No CVA tenderness.  Lungs: clear to auscultation bilaterally  Breasts: normal appearance, no masses or tenderness Heart: regular rate and rhythm, S1, S2 normal, no murmur, click, rub or gallop  Abdomen: soft, non-tender; bowel sounds normal; no masses, no organomegaly  Musculoskeletal: ROM normal in all joints, no crepitus, no deformity, Normal muscle  strengthen. Back  is symmetric, no curvature. Skin: Skin color, texture, turgor normal. No rashes or lesions  Lymph nodes: Cervical, supraclavicular, and axillary nodes normal.  Neurologic: CN 2 -12 Normal, Normal symmetric reflexes. Normal coordination and gait  Psych: Alert & Oriented x 3, Mood appear stable.    Assessment:    Annual wellness medicare exam   Plan:    During the course of the visit the patient was educated and counseled about appropriate screening and preventive services including:   Annual mammogram  Colonoscopy is up-to-date  Recommend COVID-19 immunizations and annual flu vaccine  Pneumococcal immunizations are up-to-date and tetanus immunization was given today  We discussed screening for lung cancer with history of smoking.  Will make referral to pulmonary.   Patient Instructions (the written plan) was given to the patient.  Medicare Attestation  I have personally reviewed:  The patient's medical and social history  Their use of alcohol, tobacco or illicit drugs  Their current medications and supplements  The patient's functional ability including ADLs,fall  risks, home safety risks, cognitive, and hearing and visual impairment  Diet and physical activities  Evidence for depression or mood disorders  The patient's weight, height, BMI, and visual acuity have been recorded in the chart. I have made referrals, counseling, and provided education to the patient based on review of the above and I have provided the patient with a written personalized care plan for preventive services.

## 2019-11-06 ENCOUNTER — Telehealth: Payer: Self-pay | Admitting: Internal Medicine

## 2019-11-06 ENCOUNTER — Other Ambulatory Visit: Payer: Self-pay | Admitting: Internal Medicine

## 2019-11-06 NOTE — Telephone Encounter (Signed)
Pt called and [facility] said they needed an ambulatory lung cancer screening

## 2019-11-06 NOTE — Telephone Encounter (Signed)
Please make an appt to discuss and order

## 2019-11-07 ENCOUNTER — Telehealth: Payer: Self-pay | Admitting: Internal Medicine

## 2019-11-07 ENCOUNTER — Other Ambulatory Visit: Payer: Self-pay | Admitting: Internal Medicine

## 2019-11-07 DIAGNOSIS — Z1231 Encounter for screening mammogram for malignant neoplasm of breast: Secondary | ICD-10-CM

## 2019-11-07 NOTE — Telephone Encounter (Signed)
Patient is interested in lung cancer screening as she is a former smoker. [facility] does not screen by CT unless has been less than 15 years since smoked. Patient quit smoking in 1982. Patient may try contacting imaging center at [facility] [phone] to see if we can send her there. Order could be faxed to [phone] is she qualifies.

## 2019-11-07 NOTE — Telephone Encounter (Signed)
Left message needs to call back to book appt regarding this.

## 2019-11-13 ENCOUNTER — Ambulatory Visit
Admission: RE | Admit: 2019-11-13 | Discharge: 2019-11-13 | Disposition: A | Discharge status: Home / Self Care | Payer: PPO | Source: Ambulatory Visit

## 2019-11-13 ENCOUNTER — Other Ambulatory Visit: Payer: Self-pay

## 2019-11-13 DIAGNOSIS — Z1231 Encounter for screening mammogram for malignant neoplasm of breast: Secondary | ICD-10-CM

## 2019-11-15 ENCOUNTER — Encounter: Payer: Self-pay | Admitting: Internal Medicine

## 2019-12-30 ENCOUNTER — Other Ambulatory Visit: Payer: Self-pay | Admitting: Internal Medicine

## 2020-01-01 ENCOUNTER — Telehealth: Payer: Self-pay | Admitting: Internal Medicine

## 2020-01-01 ENCOUNTER — Encounter (HOSPITAL_COMMUNITY): Payer: Self-pay | Admitting: Emergency Medicine

## 2020-01-01 ENCOUNTER — Ambulatory Visit (INDEPENDENT_AMBULATORY_CARE_PROVIDER_SITE_OTHER): Payer: PPO | Admitting: Internal Medicine

## 2020-01-01 ENCOUNTER — Encounter: Payer: Self-pay | Admitting: Internal Medicine

## 2020-01-01 ENCOUNTER — Emergency Department (HOSPITAL_COMMUNITY)
Admission: EM | Admit: 2020-01-01 | Discharge: 2020-01-01 | Disposition: A | Discharge status: Home / Self Care | Payer: PPO | Attending: Emergency Medicine | Admitting: Emergency Medicine

## 2020-01-01 ENCOUNTER — Other Ambulatory Visit: Payer: Self-pay

## 2020-01-01 ENCOUNTER — Emergency Department (HOSPITAL_COMMUNITY): Payer: PPO

## 2020-01-01 VITALS — HR 92 | Temp 99.3°F

## 2020-01-01 DIAGNOSIS — R059 Cough, unspecified: Secondary | ICD-10-CM | POA: Diagnosis not present

## 2020-01-01 DIAGNOSIS — R062 Wheezing: Secondary | ICD-10-CM | POA: Insufficient documentation

## 2020-01-01 DIAGNOSIS — Z20822 Contact with and (suspected) exposure to covid-19: Secondary | ICD-10-CM | POA: Insufficient documentation

## 2020-01-01 DIAGNOSIS — Z87891 Personal history of nicotine dependence: Secondary | ICD-10-CM | POA: Diagnosis not present

## 2020-01-01 DIAGNOSIS — B974 Respiratory syncytial virus as the cause of diseases classified elsewhere: Secondary | ICD-10-CM | POA: Diagnosis not present

## 2020-01-01 DIAGNOSIS — R0602 Shortness of breath: Secondary | ICD-10-CM | POA: Insufficient documentation

## 2020-01-01 DIAGNOSIS — H903 Sensorineural hearing loss, bilateral: Secondary | ICD-10-CM | POA: Diagnosis not present

## 2020-01-01 DIAGNOSIS — R410 Disorientation, unspecified: Secondary | ICD-10-CM | POA: Diagnosis not present

## 2020-01-01 DIAGNOSIS — B338 Other specified viral diseases: Secondary | ICD-10-CM

## 2020-01-01 DIAGNOSIS — E869 Volume depletion, unspecified: Secondary | ICD-10-CM | POA: Diagnosis not present

## 2020-01-01 DIAGNOSIS — E039 Hypothyroidism, unspecified: Secondary | ICD-10-CM | POA: Insufficient documentation

## 2020-01-01 DIAGNOSIS — Z79899 Other long term (current) drug therapy: Secondary | ICD-10-CM | POA: Diagnosis not present

## 2020-01-01 DIAGNOSIS — F1011 Alcohol abuse, in remission: Secondary | ICD-10-CM

## 2020-01-01 DIAGNOSIS — J069 Acute upper respiratory infection, unspecified: Secondary | ICD-10-CM | POA: Diagnosis not present

## 2020-01-01 DIAGNOSIS — I10 Essential (primary) hypertension: Secondary | ICD-10-CM | POA: Diagnosis not present

## 2020-01-01 LAB — RESP PANEL BY RT PCR (RSV, FLU A&B, COVID)
Influenza A by PCR: NEGATIVE
Influenza B by PCR: NEGATIVE
Respiratory Syncytial Virus by PCR: POSITIVE — AB
SARS Coronavirus 2 by RT PCR: NEGATIVE

## 2020-01-01 MED ORDER — CEFTRIAXONE SODIUM 1 G IJ SOLR
1.0000 g | Freq: Once | INTRAMUSCULAR | Status: AC
  Administered 2020-01-01: 1 g via INTRAMUSCULAR

## 2020-01-01 MED ORDER — ALBUTEROL SULFATE HFA 108 (90 BASE) MCG/ACT IN AERS
8.0000 | INHALATION_SPRAY | Freq: Once | RESPIRATORY_TRACT | Status: AC
  Administered 2020-01-01: 8 via RESPIRATORY_TRACT
  Filled 2020-01-01: qty 6.7

## 2020-01-01 MED ORDER — AZITHROMYCIN 250 MG PO TABS: ORAL_TABLET | ORAL | 0 refills | Status: DC

## 2020-01-01 MED ORDER — PREDNISONE 50 MG PO TABS: ORAL_TABLET | ORAL | 0 refills | Status: DC

## 2020-01-01 MED ORDER — PREDNISONE 20 MG PO TABS
60.0000 mg | ORAL_TABLET | Freq: Once | ORAL | Status: AC
  Administered 2020-01-01: 60 mg via ORAL
  Filled 2020-01-01: qty 3

## 2020-01-01 NOTE — Patient Instructions (Addendum)
One gram IM Rocephin given. Labs drawn and pending including CBC diff, C-met and alcohol level. Hydrate with ginger ale and soups. Avoid alcohol. Have CXR for possible pneumonia. Take Zithromax Z-pak 2 po day 1 followed by one po days 2-5. Go to ED if symptoms worsen. We will call you tomorrow.

## 2020-01-01 NOTE — ED Triage Notes (Signed)
Pt states that she was sent here by her PCP after having a cough x 1 week. PCP wants chest Xray but gave antibiotics and took blood work at office. Alert and oriented.

## 2020-01-01 NOTE — Telephone Encounter (Signed)
LVM on [name] phone to Call office

## 2020-01-01 NOTE — Telephone Encounter (Signed)
Scheduled

## 2020-01-01 NOTE — Telephone Encounter (Addendum)
LVM to CB on Scott's phone

## 2020-01-01 NOTE — Progress Notes (Signed)
Subjective:    Patient ID: Alexandra Taylor, female    DOB: 05/16/1950, 69 y.o.   MRN: [id number]  HPI 69 year old Female recently started taking Gabapentin to help her quit drinking through an agency I am not familiar with. Has come down with respiratory infection.Husband has had similar illness and tested negative for Covid-19.  Patient has not been tested for Covid-19 until today but had third Covid booster about 3 weeks ago.   Has cough and congestion. Has been groggy but also drank wine last evening, took Delsym, and Gabapentin as well. Called complaining of grogginess and cough. Has only consumes water today. She wears hearing aids and is hard of hearing. Brought to office by her husband.  Her general health is good for the most part.  Had annual Medicare wellness visit in August.  History of hearing loss seen by Dr. Lucia Gaskins.  CBC drawn today is within normal limits with normal white blood cell count and hemoglobin.  Liver functions are slightly elevated.  SGOT is 49 and SGPT 54.  Alkaline phosphatase slightly elevated at 172.  Bilirubin is normal.  Sodium is low at 130.  Chloride is low at 95 consistent with mild volume depletion.  Ethanol is not detected in the blood.  Respiratory virus panel positive for RSV.  COVID-19 test is negative.      Review of Systems see above     Objective:   Physical Exam  T 99.3 degrees pulse 92 Pulse ox 96 % Neck is supple.  Chest clear to auscultation.  Cardiac exam regular rate and rhythm.  She appears to be drowsy but alert and cooperative and able to follow directions. She looks a bit pale and sounds nasally congested.  She is hard of hearing.  She is a bit unsteady on her feet.  Complains of feeling groggy.  TMs are clear.  Pharynx slightly injected.  Respiratory virus panel and COVID-19 PCR test obtained by nasal swabs.  Chest: Inspiratory wheezing bilaterally but no frank rales.  CBC, c-Met, alcohol level drawn today in office.        Assessment & Plan:  History of alcohol abuse-recently treated by another provider with gabapentin.  She took gabapentin, Delsym, and drink alcohol last evening.  This may be contributing to her grogginess.  She is not hypoxic.  Cough and congestion-possible pneumonia.  She is going to go to [facility] for a stat chest x-ray and I will follow up with that later this evening.  Will be started on Zithromax Z-PAK 2 p.o. day 1 followed by 1 p.o. days 2 through 5.  We will follow-up with her tomorrow.  If she gets worse tonight she is to be taken to the emergency department.  I had a conversation with her husband explaining this today.  She is not to consume alcohol tonight.  Follow-up here in 1 week or sooner if necessary.  1 g IM Rocephin given in office.  Follow-up in 1 week.  Hydrate well with ginger ale and soups.  Avoid alcohol.  Hearing loss-wears bilateral hearing aids  Plan: Begin Zithromax Z-Pak:

## 2020-01-01 NOTE — Discharge Instructions (Signed)
Use inhaler as directed.  Prednisone as directed

## 2020-01-01 NOTE — Telephone Encounter (Signed)
Tawana Scale [phone]  Nicki Reaper called to say that he and Alexandra Taylor has been sick for the last several days with headache,coughing,sneezing, mild fever and she took some Gabapentin and Dexertmethane 31ml last night for the cough and to get some sleep and this morning she is dizzy and disoriented and wants to know what to do for that. He states that she talks okay to him. He also said he had tested negative to COVID-19

## 2020-01-01 NOTE — ED Notes (Signed)
Assumed care of patient at this time, nad noted, sr up x2, bed locked and low, call bell w/I reach.  Will continue to monitor. ° °

## 2020-01-01 NOTE — Telephone Encounter (Signed)
Car visit

## 2020-01-01 NOTE — ED Provider Notes (Signed)
[facility] DEPT Provider Note   CSN: [id number] Arrival date & time: 01/01/20  1747     History Chief Complaint  Patient presents with  . Cough    Alexandra Taylor is a 69 y.o. female.  The history is provided by the patient.  Cough Cough characteristics:  Non-productive Sputum characteristics:  Nondescript Severity:  Moderate Onset quality:  Gradual Duration:  1 week Timing:  Constant Progression:  Worsening Chronicity:  New Smoker: no   Context: upper respiratory infection   Context: not sick contacts   Relieved by:  Nothing Worsened by:  Nothing Ineffective treatments:  None tried Associated symptoms: no chest pain   Risk factors: no recent infection   Pt reports she was seen by her MD today and advised to come here.  Pt reports cough and some shortness of breath.     Past Medical History:  Diagnosis Date  . Hyperlipidemia   . Hypertension   . Osteopenia     Patient Active Problem List   Diagnosis Date Noted  . Anxiety and depression 01/29/2012  . Hypothyroidism 12/05/2010  . Attention deficit disorder (ADD) 12/05/2010  . Hypertension 12/05/2010    Past Surgical History:  Procedure Laterality Date  . hemithyroidectomy       OB History   No obstetric history on file.     Family History  Problem Relation Age of Onset  . Hypertension Mother     Social History   Tobacco Use  . Smoking status: Former Smoker    Packs/day: 1.00    Years: 15.00    Pack years: 15.00    Types: Cigarettes    Quit date: 02/28/1984    Years since quitting: 35.8  . Smokeless tobacco: Never Used  Substance Use Topics  . Alcohol use: Yes  . Drug use: No    Home Medications Prior to Admission medications   Medication Sig Start Date End Date Taking? Authorizing Provider  azithromycin (ZITHROMAX) 250 MG tablet Take 2 tablets on day one and take one tablet po on days 2-5 01/01/20   Elby Showers, MD  b complex vitamins tablet Take  1 tablet by mouth daily.    [provider]  BIOTIN 5000 PO Take by mouth.    [provider]  fish oil-omega-3 fatty acids 1000 MG capsule Take 2 g by mouth daily.      [provider]  FLUoxetine (PROZAC) 20 MG capsule TAKE 2 CAPSULES BY MOUTH EVERY DAY 11/06/19   Elby Showers, MD  fluticasone (FLONASE) 50 MCG/ACT nasal spray Place 1 spray into both nostrils as needed. 07/17/17   [provider]  levothyroxine (SYNTHROID) 50 MCG tablet TAKE 1 TABLET BY MOUTH EVERY DAY BEFORE BREAKFAST 03/11/19   Elby Showers, MD  predniSONE (DELTASONE) 50 MG tablet One a day 01/01/20   Fransico Meadow, PA-C  Probiotic Product (PROBIOTIC PO) Take 1 tablet by mouth daily.    [provider]  ramipril (ALTACE) 10 MG capsule TAKE 1 CAPSULE BY MOUTH EVERY DAY 12/30/19   Baxley, Cresenciano Lick, MD    Allergies    Patient has no known allergies.  Review of Systems   Review of Systems  Respiratory: Positive for cough.   Cardiovascular: Negative for chest pain.  All other systems reviewed and are negative.   Physical Exam Updated Vital Signs BP (!) 143/74 (BP Location: Left Arm)   Pulse 79   Temp 99.3 F (37.4 C) (Oral)  Resp 18   Ht 5\' 3"  (1.6 m)   Wt 65.8 kg   SpO2 95%   BMI 25.69 kg/m   Physical Exam Vitals and nursing note reviewed.  Constitutional:      Appearance: She is well-developed.  HENT:     Head: Normocephalic.  Cardiovascular:     Rate and Rhythm: Normal rate and regular rhythm.  Pulmonary:     Effort: Pulmonary effort is normal.     Breath sounds: Wheezing present.  Abdominal:     General: Abdomen is flat. There is no distension.  Musculoskeletal:        General: Normal range of motion.     Cervical back: Normal range of motion.  Skin:    General: Skin is warm.  Neurological:     General: No focal deficit present.     Mental Status: She is alert and oriented to person, place, and time.  Psychiatric:        Mood and Affect: Mood  normal.     ED Results / Procedures / Treatments   Labs (all labs ordered are listed, but only abnormal results are displayed) Labs Reviewed  RESP PANEL BY RT PCR (RSV, FLU A&B, COVID) - Abnormal; Notable for the following components:      Result Value   Respiratory Syncytial Virus by PCR POSITIVE (*)    All other components within normal limits    EKG None  Radiology DG Chest Port 1 View  Result Date: 01/01/2020 CLINICAL DATA:  Cough EXAM: PORTABLE CHEST 1 VIEW COMPARISON:  None. FINDINGS: The heart size and mediastinal contours are within normal limits. Both lungs are clear. The visualized skeletal structures are unremarkable. IMPRESSION: No active disease. Electronically Signed   By: Prudencio Pair M.D.   On: 01/01/2020 19:04    Procedures Procedures (including critical care time)  Medications Ordered in ED Medications  albuterol (VENTOLIN HFA) 108 (90 Base) MCG/ACT inhaler 8 puff (8 puffs Inhalation Given 01/01/20 1902)  predniSONE (DELTASONE) tablet 60 mg (60 mg Oral Given 01/01/20 2025)    ED Course  I have reviewed the triage vital signs and the nursing notes.  Pertinent labs & imaging results that were available during my care of the patient were reviewed by me and considered in my medical decision making (see chart for details).    MDM Rules/Calculators/A&P                          MDM:  Chest xray no pneumonia, covid and flu negative.  Pt positive for rsv.  Pt given prednisone here.  Pt advised to use inhaler, Rx for prednsione.  Final Clinical Impression(s) / ED Diagnoses Final diagnoses:  RSV (respiratory syncytial virus infection)    Rx / DC Orders ED Discharge Orders         Ordered    predniSONE (DELTASONE) 50 MG tablet        01/01/20 2012        An After Visit Summary was printed and given to the patient.    Sidney Ace 01/01/20 2133    Davonna Belling, MD 01/01/20 2236

## 2020-01-02 LAB — RESPIRATORY VIRUS PANEL
Adenovirus B: NOT DETECTED
HUMAN PARAINFLU VIRUS 1: NOT DETECTED
HUMAN PARAINFLU VIRUS 2: NOT DETECTED
HUMAN PARAINFLU VIRUS 3: NOT DETECTED
INFLUENZA A SUBTYPE H1: NOT DETECTED
INFLUENZA A SUBTYPE H3: NOT DETECTED
Influenza A: NOT DETECTED
Influenza B: NOT DETECTED
Metapneumovirus: NOT DETECTED
Respiratory Syncytial Virus A: NOT DETECTED
Respiratory Syncytial Virus B: DETECTED — AB
Rhinovirus: NOT DETECTED

## 2020-01-02 LAB — SARS-COV-2 RNA,(COVID-19) QUALITATIVE NAAT: SARS CoV2 RNA: NOT DETECTED

## 2020-01-05 ENCOUNTER — Encounter: Payer: Self-pay | Admitting: Internal Medicine

## 2020-01-05 ENCOUNTER — Ambulatory Visit (INDEPENDENT_AMBULATORY_CARE_PROVIDER_SITE_OTHER): Payer: PPO | Admitting: Internal Medicine

## 2020-01-05 ENCOUNTER — Other Ambulatory Visit: Payer: Self-pay

## 2020-01-05 VITALS — BP 140/90 | HR 66 | Temp 97.9°F | Ht 63.0 in | Wt 145.0 lb

## 2020-01-05 DIAGNOSIS — B974 Respiratory syncytial virus as the cause of diseases classified elsewhere: Secondary | ICD-10-CM | POA: Diagnosis not present

## 2020-01-05 DIAGNOSIS — F1011 Alcohol abuse, in remission: Secondary | ICD-10-CM

## 2020-01-05 DIAGNOSIS — B338 Other specified viral diseases: Secondary | ICD-10-CM

## 2020-01-05 LAB — COMPLETE METABOLIC PANEL WITH GFR
AG Ratio: 1.5 (calc) (ref 1.0–2.5)
ALT: 54 U/L — ABNORMAL HIGH (ref 6–29)
AST: 49 U/L — ABNORMAL HIGH (ref 10–35)
Albumin: 4.1 g/dL (ref 3.6–5.1)
Alkaline phosphatase (APISO): 172 U/L — ABNORMAL HIGH (ref 37–153)
BUN: 8 mg/dL (ref 7–25)
CO2: 25 mmol/L (ref 20–32)
Calcium: 9.2 mg/dL (ref 8.6–10.4)
Chloride: 95 mmol/L — ABNORMAL LOW (ref 98–110)
Creat: 0.73 mg/dL (ref 0.50–0.99)
GFR, Est African American: 98 mL/min/{1.73_m2} (ref >=60)
GFR, Est Non African American: 85 mL/min/{1.73_m2} (ref >=60)
Globulin: 2.8 g/dL (calc) (ref 1.9–3.7)
Glucose, Bld: 105 mg/dL — ABNORMAL HIGH (ref 65–99)
Potassium: 4.9 mmol/L (ref 3.5–5.3)
Sodium: 130 mmol/L — ABNORMAL LOW (ref 135–146)
Total Bilirubin: 0.5 mg/dL (ref 0.2–1.2)
Total Protein: 6.9 g/dL (ref 6.1–8.1)

## 2020-01-05 LAB — ETHANOL, CLINICAL
Alcohol, Ethyl (B): NOT DETECTED g/dL(%)
Alcohol, Ethyl (B): NOT DETECTED mg/dL

## 2020-01-05 LAB — CBC WITH DIFFERENTIAL/PLATELET
Absolute Monocytes: 547 cells/uL (ref 200–950)
Basophils Absolute: 29 cells/uL (ref 0–200)
Basophils Relative: 0.6 %
Eosinophils Absolute: 29 cells/uL (ref 15–500)
Eosinophils Relative: 0.6 %
HCT: 42.1 % (ref 35.0–45.0)
Hemoglobin: 14.7 g/dL (ref 11.7–15.5)
Lymphs Abs: 1133 cells/uL (ref 850–3900)
MCH: 34.5 pg — ABNORMAL HIGH (ref 27.0–33.0)
MCHC: 34.9 g/dL (ref 32.0–36.0)
MCV: 98.8 fL (ref 80.0–100.0)
MPV: 10.4 fL (ref 7.5–12.5)
Monocytes Relative: 11.4 %
Neutro Abs: 3062 cells/uL (ref 1500–7800)
Neutrophils Relative %: 63.8 %
Platelets: 193 10*3/uL (ref 140–400)
RBC: 4.26 10*6/uL (ref 3.80–5.10)
RDW: 12.3 % (ref 11.0–15.0)
Total Lymphocyte: 23.6 %
WBC: 4.8 10*3/uL (ref 3.8–10.8)

## 2020-01-05 MED ORDER — ALBUTEROL SULFATE HFA 108 (90 BASE) MCG/ACT IN AERS: 2.0000 | INHALATION_SPRAY | Freq: Four times a day (QID) | RESPIRATORY_TRACT | 2 refills | Status: DC | PRN

## 2020-01-05 NOTE — Progress Notes (Signed)
   Subjective:    Patient ID: Alexandra Taylor, female    DOB: Oct 07, 1950, 69 y.o.   MRN: [id number]  HPI 69 year old Female seen in follw up after presenting here with lethargy, malaise and fatigue with respiratory infection symptoms.  She was found to have RSV.  She is here now for follow-up.  Also has issues with alcohol abuse.  Have recommended [facility] outpatient program for her.  She was seen in the emergency department on November 4 and tested positive for RSV.  COVID-19 test was negative.  She has been taking gabapentin through some online program to help her quit drinking.  She and her husband both had similar illness and her respiratory virus panel was positive for RSV.  She had mild hyponatremia when tested on November 4.  SGOT was 49 and SGPT was 54.  Alkaline phosphatase 172.  Alcohol level was 0.  Feeling much better today.  Regaining appetite.    Review of Systems denies fever chills nausea vomiting     Objective:   Physical Exam Blood pressure 140/90 pulse 66 regular temperature 97.9 degrees pulse oximetry 96% weight 145 pounds.  BMI 25.69  Skin warm and dry.  No cervical adenopathy.  Chest clear to auscultation.  Cardiac exam regular rate and rhythm normal S1 and S2.  No lower extremity edema.  Affect thought and judgment appear to be normal.       Assessment & Plan:  RSV infection-improved.  Prescribed albuterol inhaler 2 sprays 4 times daily as needed for cough and wheezing.  History of alcoholism-recommended [facility] outpatient treatment.  Plan: Call if not continuing to improve.  Physical exam due in August.

## 2020-01-12 ENCOUNTER — Encounter: Payer: Self-pay | Admitting: Internal Medicine

## 2020-01-12 ENCOUNTER — Ambulatory Visit (INDEPENDENT_AMBULATORY_CARE_PROVIDER_SITE_OTHER): Payer: PPO | Admitting: Internal Medicine

## 2020-01-12 ENCOUNTER — Other Ambulatory Visit: Payer: Self-pay

## 2020-01-12 VITALS — BP 120/70 | HR 85 | Temp 98.1°F | Ht 63.0 in | Wt 142.0 lb

## 2020-01-12 DIAGNOSIS — H6501 Acute serous otitis media, right ear: Secondary | ICD-10-CM | POA: Diagnosis not present

## 2020-01-12 DIAGNOSIS — B974 Respiratory syncytial virus as the cause of diseases classified elsewhere: Secondary | ICD-10-CM | POA: Diagnosis not present

## 2020-01-12 DIAGNOSIS — J22 Unspecified acute lower respiratory infection: Secondary | ICD-10-CM | POA: Diagnosis not present

## 2020-01-12 DIAGNOSIS — R059 Cough, unspecified: Secondary | ICD-10-CM

## 2020-01-12 DIAGNOSIS — B338 Other specified viral diseases: Secondary | ICD-10-CM

## 2020-01-12 MED ORDER — CEFTRIAXONE SODIUM 1 G IJ SOLR
1.0000 g | Freq: Once | INTRAMUSCULAR | Status: AC
  Administered 2020-01-12: 1 g via INTRAMUSCULAR

## 2020-01-12 MED ORDER — BENZONATATE 100 MG PO CAPS: 100.0000 mg | ORAL_CAPSULE | Freq: Three times a day (TID) | ORAL | 1 refills | Status: DC | PRN

## 2020-01-12 MED ORDER — LEVOFLOXACIN 500 MG PO TABS: 500.0000 mg | ORAL_TABLET | Freq: Every day | ORAL | 0 refills | Status: DC

## 2020-01-12 NOTE — Progress Notes (Signed)
   Subjective:    Patient ID: Alexandra Taylor, female    DOB: Jun 29, 1950, 69 y.o.   MRN: [id number]  HPI Patient was seen here on November 4 and was diagnosed with RSV based on Respiratory virus panel obtained at that time.  Patient stated she had recently started taking gabapentin to help her quit drinking alcohol through an agency I am not familiar with on line.  Subsequently had come down with a respiratory infection and husband had similar illness.  COVID-19 test done here on November 4 was negative.  Patient was complaining of cough and congestion.  She had taken Delsym and gabapentin on November 4.  Complains of grogginess.  And only consumed water on the day of the visit.  Was brought to the office by her husband.  She had low-grade temp of 99.3 degrees and pulse oximetry was 96%.  She was unsteady on her feet and feeling groggy.  She had inspiratory wheezing bilaterally but no rales.  I sent her to [facility] for chest x-ray but instead of just getting chest x-ray she was seen in the emergency department and was diagnosed with RSV.  Was treated with prednisone 60 mg orally and albuterol inhaler.  She is now here for follow-up.  She continues with cough.  Not 100% better but feeling a little bit better.  Chest x-ray in the emergency department showed no active disease.    Review of Systems     Objective:   Physical Exam Blood pressure 120/70 temperature 98.1 degrees pulse oximetry 97% pulse 85 regular weight 142 pounds  Skin warm and dry.  Nodes none.  TMs are clear.  Pharynx is clear.  Neck is supple.  No adenopathy in neck.  Chest clear to auscultation.       Assessment & Plan:  Protracted lower respiratory infection-RSV  History of alcoholism-do not take any more gabapentin.  Recommend [facility] for counseling  Plan: 1 g IM Rocephin.  Initially wanted to treat her with Levaquin but she has concerns about tendon issues with that medication.  Therefore she was treated with  Zithromax Z-PAK 2 p.o. day 1 followed by 1 p.o. days 2 through 5 and was advised to continue with albuterol inhaler.  Take Tessalon Perles 100 mg 3 times a day as needed.  Rest and drink fluids.

## 2020-01-12 NOTE — Patient Instructions (Addendum)
1 g IM Rocephin given in the office. Take Levaquin 500 mg daily for 10 days. Use inhaler 2 sprays 4 times daily. Tessalon Perles 100 mg up to 3 times daily as needed for cough. Call if not improved in 10 days or sooner if worse.  Addendum: Patient does not want to take Levaquin and was changed to Zithromax.

## 2020-01-13 ENCOUNTER — Telehealth (INDEPENDENT_AMBULATORY_CARE_PROVIDER_SITE_OTHER): Payer: PPO | Admitting: Internal Medicine

## 2020-01-13 ENCOUNTER — Encounter: Payer: Self-pay | Admitting: Internal Medicine

## 2020-01-13 DIAGNOSIS — J22 Unspecified acute lower respiratory infection: Secondary | ICD-10-CM

## 2020-01-13 MED ORDER — AZITHROMYCIN 250 MG PO TABS: ORAL_TABLET | ORAL | 0 refills | Status: DC

## 2020-01-13 NOTE — Telephone Encounter (Signed)
Patient called about concerns regarding tendon issues with levaquin. Read info from pharmacy and became concerned. Has protracted respiratory infection which has not cleared with levaquin. Still had cough when seen yesterday. I spoke with patient by phone personally today. She is at home and I am in my office. She is agreeable to speaking with me. I understand her concerns but I am concerned she has had acute lower respiratory infection for weeks and in reality I have not seen tendon issues with this med although it is reported. We can try Zithromax but I felt Levaquin had a broader spectrum of coverage. Time spent speaking with patient, reviewing chart and medical decision making as well as note creation and e-scribe to pharmacy is 10 minutes.will change to Zithromax Z pak 2 po day 1 followed by one po days 2-5.  MJB. MD

## 2020-01-24 ENCOUNTER — Encounter: Payer: Self-pay | Admitting: Internal Medicine

## 2020-01-27 NOTE — Patient Instructions (Signed)
Albuterol inhaler 2 sprays by mouth 4 times a day as needed for shortness of breath/wheezing.  Recovering from respiratory syncytial virus.  Recommend [facility] outpatient program for alcohol issues.  Physical exam due in August.

## 2020-03-05 DIAGNOSIS — Z6824 Body mass index (BMI) 24.0-24.9, adult: Secondary | ICD-10-CM | POA: Diagnosis not present

## 2020-03-05 DIAGNOSIS — M150 Primary generalized (osteo)arthritis: Secondary | ICD-10-CM | POA: Diagnosis not present

## 2020-03-05 DIAGNOSIS — M2550 Pain in unspecified joint: Secondary | ICD-10-CM | POA: Diagnosis not present

## 2020-03-05 DIAGNOSIS — L409 Psoriasis, unspecified: Secondary | ICD-10-CM | POA: Diagnosis not present

## 2020-03-05 DIAGNOSIS — M79645 Pain in left finger(s): Secondary | ICD-10-CM | POA: Diagnosis not present

## 2020-03-23 ENCOUNTER — Ambulatory Visit (INDEPENDENT_AMBULATORY_CARE_PROVIDER_SITE_OTHER): Payer: Self-pay | Admitting: Plastic Surgery

## 2020-03-23 ENCOUNTER — Encounter: Payer: Self-pay | Admitting: Plastic Surgery

## 2020-03-23 ENCOUNTER — Other Ambulatory Visit: Payer: Self-pay

## 2020-03-23 DIAGNOSIS — Z719 Counseling, unspecified: Secondary | ICD-10-CM | POA: Insufficient documentation

## 2020-03-23 NOTE — Progress Notes (Signed)
Botulinum Toxin Procedure Note  Procedure: Cosmetic botulinum toxin   Pre-operative Diagnosis: Dynamic rhytides   Post-operative Diagnosis: Same  Complications:  None  Brief history: The patient desires botulinum toxin injection of her forehead. I discussed with the patient this proposed procedure of botulinum toxin injections, which is customized depending on the particular needs of the patient. It is performed on facial rhytids as a temporary correction. The alternatives were discussed with the patient. The risks were addressed including bleeding, scarring, infection, damage to deeper structures, asymmetry, and chronic pain, which may occur infrequently after a procedure. The individual's choice to undergo a surgical procedure is based on the comparison of risks to potential benefits. Other risks include unsatisfactory results, brow ptosis, eyelid ptosis, allergic reaction, temporary paralysis, which should go away with time, bruising, blurring disturbances and delayed healing. Botulinum toxin injections do not arrest the aging process or produce permanent tightening of the eyelid.  Operative intervention maybe necessary to maintain the results of a blepharoplasty or botulinum toxin. The patient understands and wishes to proceed.  Procedure: The area was prepped with alcohol and dried with a clean gauze. Using a clean technique, the botulinum toxin was diluted with 1.25 cc of preservative-free normal saline which was slowly injected with an 18 gauge needle in a tuberculin syringes.  A 32 gauge needles were then used to inject the botulinum toxin. This mixture allow for an aliquot of 5 units per 0.1 cc in each injection site.    Subsequently the mixture was injected in the glabellar and forehead area with preservation of the temporal branch to the lateral eyebrow as well as into each lateral canthal area beginning from the lateral orbital rim medial to the zygomaticus major in 3 separate areas. A  total of 30 Units of botulinum toxin was used. The forehead and glabellar area was injected with care to inject intramuscular only while holding pressure on the supratrochlear vessels in each area during each injection on either side of the medial corrugators. The injection proceeded vertically superiorly to the medial 2/3 of the frontalis muscle and superior 2/3 of the lateral frontalis, again with preservation of the frontal branch.  No complications were noted. Light pressure was held for 5 minutes. She was instructed explicitly in post-operative care.  Botox LOT:  [id number] C4 EXP:  4/24  

## 2020-03-25 ENCOUNTER — Other Ambulatory Visit: Payer: Self-pay | Admitting: Internal Medicine

## 2020-04-27 DIAGNOSIS — D225 Melanocytic nevi of trunk: Secondary | ICD-10-CM | POA: Diagnosis not present

## 2020-04-27 DIAGNOSIS — L111 Transient acantholytic dermatosis [Grover]: Secondary | ICD-10-CM | POA: Diagnosis not present

## 2020-04-27 DIAGNOSIS — L728 Other follicular cysts of the skin and subcutaneous tissue: Secondary | ICD-10-CM | POA: Diagnosis not present

## 2020-04-27 DIAGNOSIS — L821 Other seborrheic keratosis: Secondary | ICD-10-CM | POA: Diagnosis not present

## 2020-04-27 DIAGNOSIS — L814 Other melanin hyperpigmentation: Secondary | ICD-10-CM | POA: Diagnosis not present

## 2020-04-27 DIAGNOSIS — L820 Inflamed seborrheic keratosis: Secondary | ICD-10-CM | POA: Diagnosis not present

## 2020-04-27 DIAGNOSIS — D1801 Hemangioma of skin and subcutaneous tissue: Secondary | ICD-10-CM | POA: Diagnosis not present

## 2020-04-27 DIAGNOSIS — L570 Actinic keratosis: Secondary | ICD-10-CM | POA: Diagnosis not present

## 2020-04-27 DIAGNOSIS — L84 Corns and callosities: Secondary | ICD-10-CM | POA: Diagnosis not present

## 2020-05-02 ENCOUNTER — Other Ambulatory Visit: Payer: Self-pay | Admitting: Internal Medicine

## 2020-05-14 ENCOUNTER — Institutional Professional Consult (permissible substitution): Payer: Self-pay | Admitting: Surgical

## 2020-06-13 ENCOUNTER — Other Ambulatory Visit: Payer: Self-pay | Admitting: Internal Medicine

## 2020-06-13 NOTE — Telephone Encounter (Signed)
Refill through August when she has appt

## 2020-06-15 ENCOUNTER — Telehealth: Payer: Self-pay | Admitting: Internal Medicine

## 2020-06-15 ENCOUNTER — Telehealth (INDEPENDENT_AMBULATORY_CARE_PROVIDER_SITE_OTHER): Payer: PPO | Admitting: Internal Medicine

## 2020-06-15 ENCOUNTER — Encounter: Payer: Self-pay | Admitting: Internal Medicine

## 2020-06-15 DIAGNOSIS — E039 Hypothyroidism, unspecified: Secondary | ICD-10-CM | POA: Diagnosis not present

## 2020-06-15 DIAGNOSIS — J22 Unspecified acute lower respiratory infection: Secondary | ICD-10-CM | POA: Diagnosis not present

## 2020-06-15 DIAGNOSIS — Z8659 Personal history of other mental and behavioral disorders: Secondary | ICD-10-CM

## 2020-06-15 DIAGNOSIS — U071 COVID-19: Secondary | ICD-10-CM

## 2020-06-15 DIAGNOSIS — I10 Essential (primary) hypertension: Secondary | ICD-10-CM

## 2020-06-15 MED ORDER — AZITHROMYCIN 250 MG PO TABS: ORAL_TABLET | ORAL | 0 refills | Status: DC

## 2020-06-15 MED ORDER — BENZONATATE 100 MG PO CAPS: 100.0000 mg | ORAL_CAPSULE | Freq: Three times a day (TID) | ORAL | 0 refills | Status: DC | PRN

## 2020-06-15 NOTE — Telephone Encounter (Signed)
Pt called back and said that she took an at home test and it came back positive

## 2020-06-15 NOTE — Telephone Encounter (Signed)
Set up at 5:00pm.

## 2020-06-15 NOTE — Progress Notes (Signed)
   Subjective:    Patient ID: Alexandra Taylor, female    DOB: [birth year], 70 y.o.   MRN: [id number]  HPI 70 year old Female seen today via interactive audio and video telecommunications due to Coronavirus pandemic.  She is identified as Alexandra Taylor, a patient in this practice.  She is at her home and I am at my office.  She is agreeable to visit in this format today.  Patient called earlier today to say that her husband had tested positive for COVID-19 today.  She indicated she was pretty sure she had it as well.  She says she is coughing like she did when she had respiratory syncytial virus.  She had not tested for COVID-19 which she called.  We asked her to take a home COVID test.  She did that and called back to say the result was positive and we schedule a virtual visit at that time.  She had respiratory syncytial virus in November 2021 that was protracted.  She has had 3 COVID-19 immunizations.  She has a history of hypothyroidism, hypertension, depression.    Review of Systems see above-no nausea and vomiting.  No dysgeusia.  Main symptoms are respiratory congestion and cough.     Objective:   Physical Exam Reports that her pulse oximetry is 97%.  Does not have a thermometer.  She looks pale and fatigued.  Sounds nasally congested when she speaks.  Able to give a clear concise history.       Assessment & Plan:  Acute COVID-19 infection-symptoms fit COVID-19 infection and she has a positive home test  Acute lower respiratory infection  Plan: She will will be treated with Zithromax Z-PAK 2 tablets day 1 followed by 1 tablet days 2 through 5.  Also was given Tessalon Perles 100 mg to take 3 times daily as needed for cough.  Rest and stay well-hydrated.  Obtain oxygen readings several times daily with home oximeter.  Call if symptoms worsen or oxygen level dropped significantly.

## 2020-06-15 NOTE — Telephone Encounter (Signed)
Can you set up virtual visit late today

## 2020-06-15 NOTE — Telephone Encounter (Signed)
She has a covid test and she will do that.

## 2020-06-15 NOTE — Telephone Encounter (Signed)
Does she have a home test she can take?

## 2020-06-15 NOTE — Telephone Encounter (Signed)
Pt called and said that her husband tested positve for covid today and thatb she is pretty sure she has it as well and said she is coughing like she did back in October and had RSV and she wasn't sure if she could have both or not, she hasnt been tested yet. She wanted to know what she should do as well.

## 2020-06-24 ENCOUNTER — Encounter: Payer: Self-pay | Admitting: Internal Medicine

## 2020-06-24 NOTE — Patient Instructions (Signed)
We are sorry to hear that you are ill.  Take Zithromax Z-PAK 2 tabs day 1 followed by 1 tab days 2 through 5.  Rest and drink plenty of fluids.  Stay well-hydrated.  Monitor pulse oximetry frequently.  Call if symptoms worsen or pulse oximetry readings dropped significantly.

## 2020-06-25 ENCOUNTER — Telehealth: Payer: Self-pay

## 2020-06-25 NOTE — Telephone Encounter (Signed)
Patient called me back she said she is feeling much much better and to thank you for checking on her. She knows to call back if she needs anything.

## 2020-09-20 DIAGNOSIS — E039 Hypothyroidism, unspecified: Secondary | ICD-10-CM | POA: Diagnosis not present

## 2020-09-20 DIAGNOSIS — T63461A Toxic effect of venom of wasps, accidental (unintentional), initial encounter: Secondary | ICD-10-CM | POA: Diagnosis not present

## 2020-09-20 DIAGNOSIS — I10 Essential (primary) hypertension: Secondary | ICD-10-CM | POA: Diagnosis not present

## 2020-10-12 ENCOUNTER — Other Ambulatory Visit: Payer: PPO | Admitting: Internal Medicine

## 2020-10-12 ENCOUNTER — Other Ambulatory Visit: Payer: Self-pay

## 2020-10-12 DIAGNOSIS — I10 Essential (primary) hypertension: Secondary | ICD-10-CM

## 2020-10-12 DIAGNOSIS — E785 Hyperlipidemia, unspecified: Secondary | ICD-10-CM | POA: Diagnosis not present

## 2020-10-12 DIAGNOSIS — Z0000 Encounter for general adult medical examination without abnormal findings: Secondary | ICD-10-CM

## 2020-10-12 DIAGNOSIS — E039 Hypothyroidism, unspecified: Secondary | ICD-10-CM | POA: Diagnosis not present

## 2020-10-12 DIAGNOSIS — H903 Sensorineural hearing loss, bilateral: Secondary | ICD-10-CM | POA: Diagnosis not present

## 2020-10-12 DIAGNOSIS — Z8659 Personal history of other mental and behavioral disorders: Secondary | ICD-10-CM | POA: Diagnosis not present

## 2020-10-13 ENCOUNTER — Other Ambulatory Visit: Payer: Self-pay

## 2020-10-13 DIAGNOSIS — R7989 Other specified abnormal findings of blood chemistry: Secondary | ICD-10-CM

## 2020-10-13 DIAGNOSIS — R718 Other abnormality of red blood cells: Secondary | ICD-10-CM

## 2020-10-14 LAB — COMPLETE METABOLIC PANEL WITH GFR
AG Ratio: 1.8 (calc) (ref 1.0–2.5)
ALT: 56 U/L — ABNORMAL HIGH (ref 6–29)
AST: 56 U/L — ABNORMAL HIGH (ref 10–35)
Albumin: 4.1 g/dL (ref 3.6–5.1)
Alkaline phosphatase (APISO): 112 U/L (ref 37–153)
BUN: 11 mg/dL (ref 7–25)
CO2: 27 mmol/L (ref 20–32)
Calcium: 9.3 mg/dL (ref 8.6–10.4)
Chloride: 102 mmol/L (ref 98–110)
Creat: 0.83 mg/dL (ref 0.50–1.05)
Globulin: 2.3 g/dL (calc) (ref 1.9–3.7)
Glucose, Bld: 82 mg/dL (ref 65–99)
Potassium: 4.8 mmol/L (ref 3.5–5.3)
Sodium: 138 mmol/L (ref 135–146)
Total Bilirubin: 0.7 mg/dL (ref 0.2–1.2)
Total Protein: 6.4 g/dL (ref 6.1–8.1)
eGFR: 76 mL/min/{1.73_m2} (ref >=60)

## 2020-10-14 LAB — CBC WITH DIFFERENTIAL/PLATELET
Absolute Monocytes: 291 cells/uL (ref 200–950)
Basophils Absolute: 20 cells/uL (ref 0–200)
Basophils Relative: 0.7 %
Eosinophils Absolute: 109 cells/uL (ref 15–500)
Eosinophils Relative: 3.9 %
HCT: 39.6 % (ref 35.0–45.0)
Hemoglobin: 13.1 g/dL (ref 11.7–15.5)
Lymphs Abs: 1324 cells/uL (ref 850–3900)
MCH: 34 pg — ABNORMAL HIGH (ref 27.0–33.0)
MCHC: 33.1 g/dL (ref 32.0–36.0)
MCV: 102.9 fL — ABNORMAL HIGH (ref 80.0–100.0)
MPV: 10.4 fL (ref 7.5–12.5)
Monocytes Relative: 10.4 %
Neutro Abs: 1056 cells/uL — ABNORMAL LOW (ref 1500–7800)
Neutrophils Relative %: 37.7 %
Platelets: 150 10*3/uL (ref 140–400)
RBC: 3.85 10*6/uL (ref 3.80–5.10)
RDW: 12.2 % (ref 11.0–15.0)
Total Lymphocyte: 47.3 %
WBC: 2.8 10*3/uL — ABNORMAL LOW (ref 3.8–10.8)

## 2020-10-14 LAB — TEST AUTHORIZATION

## 2020-10-14 LAB — VITAMIN B12: Vitamin B-12: 914 pg/mL (ref 200–1100)

## 2020-10-14 LAB — LIPID PANEL
Cholesterol: 197 mg/dL (ref <200)
HDL: 103 mg/dL (ref >=50)
LDL Cholesterol (Calc): 84 mg/dL (calc)
Non-HDL Cholesterol (Calc): 94 mg/dL (calc) (ref <130)
Total CHOL/HDL Ratio: 1.9 (calc) (ref <5.0)
Triglycerides: 36 mg/dL (ref <150)

## 2020-10-14 LAB — TSH: TSH: 1.74 mIU/L (ref 0.40–4.50)

## 2020-10-14 LAB — GAMMA GT: GGT: 39 U/L (ref 3–65)

## 2020-10-14 LAB — FOLATE: Folate: 16.8 ng/mL

## 2020-10-15 ENCOUNTER — Encounter: Payer: Self-pay | Admitting: Internal Medicine

## 2020-10-15 ENCOUNTER — Other Ambulatory Visit: Payer: Self-pay

## 2020-10-15 ENCOUNTER — Ambulatory Visit (INDEPENDENT_AMBULATORY_CARE_PROVIDER_SITE_OTHER): Payer: PPO | Admitting: Internal Medicine

## 2020-10-15 VITALS — BP 140/80 | HR 66 | Ht 63.0 in | Wt 143.0 lb

## 2020-10-15 DIAGNOSIS — H903 Sensorineural hearing loss, bilateral: Secondary | ICD-10-CM | POA: Diagnosis not present

## 2020-10-15 DIAGNOSIS — D708 Other neutropenia: Secondary | ICD-10-CM | POA: Diagnosis not present

## 2020-10-15 DIAGNOSIS — Z8659 Personal history of other mental and behavioral disorders: Secondary | ICD-10-CM

## 2020-10-15 DIAGNOSIS — Z0000 Encounter for general adult medical examination without abnormal findings: Secondary | ICD-10-CM

## 2020-10-15 DIAGNOSIS — F1011 Alcohol abuse, in remission: Secondary | ICD-10-CM

## 2020-10-15 DIAGNOSIS — E039 Hypothyroidism, unspecified: Secondary | ICD-10-CM

## 2020-10-15 DIAGNOSIS — I10 Essential (primary) hypertension: Secondary | ICD-10-CM

## 2020-10-15 LAB — POCT URINALYSIS DIPSTICK
Appearance: NEGATIVE
Bilirubin, UA: NEGATIVE
Blood, UA: NEGATIVE
Glucose, UA: NEGATIVE
Ketones, UA: NEGATIVE
Leukocytes, UA: NEGATIVE
Nitrite, UA: NEGATIVE
Odor: NEGATIVE
Protein, UA: NEGATIVE
Spec Grav, UA: 1.01 (ref 1.010–1.025)
Urobilinogen, UA: 0.2 E.U./dL
pH, UA: 6.5 (ref 5.0–8.0)

## 2020-10-15 MED ORDER — FLUTICASONE PROPIONATE 50 MCG/ACT NA SUSP: 1.0000 | NASAL | 5 refills | Status: DC | PRN

## 2020-10-15 NOTE — Progress Notes (Signed)
Subjective:    Patient ID: Alexandra Taylor, female    DOB: 01/30/1951, 70 y.o.   MRN: [id number]  HPI 70 year old Female seen for Medicare wellness, health maintenance exam and evaluation of medical issues.  In 2019, she saw a rheumatologist and was diagnosed with polyarthralgias and osteoarthritis.  CCP was normal.  Has a history of hypothyroidism, hypertension and osteopenia.  Was initially diagnosed with hyperthyroidism in 2004 and underwent a hemithyroidectomy resulting in hypothyroidism.  She takes thyroid replacement medication.  She was started on antihypertensive medication in 2010.  She takes Prozac for dysthymia.  Had colonoscopy in 2019.  Had mammogram in 2021.  Another mammogram has been ordered for this year.  Social history: She is married.  Does not smoke.  Enjoys reading.  Family history: Mother with history of hypertension, stroke, MI, fractured hip and anemia.  Father died at age 53 with history of dementia and arthritis of the hands.  In 2019, patient saw Dr. Mardelle Matte for evaluation of neck pain.  She had MRI findings were consistent with osteoarthritis.  History of benign neutropenia.  This has been followed and peripheral smears in the past have been unremarkable.  Had colonoscopy in 2019 that was essentially normal.  She had RSV infection in November 2021.  She had COVID-19 virus infection in April 2022.    Review of Systems  Constitutional: Negative.   Respiratory: Negative.    Cardiovascular: Negative.   Gastrointestinal: Negative.   Genitourinary: Negative.   Neurological: Negative.       Objective:   Physical Exam Blood blood pressure 140/80 pulse 66 pulse oximetry 98% weight 143 pounds BMI 25 point  Skin: Warm and dry.  Nodes none.  Neck is supple without JVD thyromegaly or carotid bruits.  Chest is clear to auscultation.  Breasts are without masses.  Abdomen is soft nondistended without hepatosplenomegaly masses or tenderness.  No lower  extremity pitting edema.  Neuro intact without focal deficits.      Assessment & Plan:   She has struggled some with alcohol use disorder.  At  one point, MCV was elevated but she is now taking B vitamin supplement and it is normal.  History of benign neutropenia and white blood cell count has increased from 40,000 in August 20 21-40 800.  MCV is normal.  Elevated liver functions SGOT is 49 and SGPT is 54.  Last year SGOT was 42 and SGPT 23.  Alkaline phosphatase is also elevated this year at 172.  This is likely due to alcohol use disorder/alcoholic hepatitis.  Her lipids are normal.  Her TSH is normal on levothyroxine 50 mcg daily.  History of hypertension treated with ramipril 10 mg daily.  Blood pressure slightly elevated today and she should monitor at home and let me know if persistently elevated.  She will continue with levothyroxine 50 mg daily.  She will continue with Prozac 20 mg 2 capsules daily  Hearing loss of both ears (sensorineural)  Plan: Her white blood cell count is once again low at 2800 and was 4800 in November 2021.  She will follow-up with me regarding this in 3 months.  She has stopped drinking alcohol.  Hopefully white blood cell count will have improved  by then.   Subjective:   Patient presents for Medicare Annual/Subsequent preventive examination.  Review Past Medical/Family/Social: See above    Risk Factors  Current exercise habits: Regular Dietary issues discussed: Yes  Cardiac risk factors: Family history of MI in mother  Depression Screen  (Note: if answer to either of the following is "Yes", a more complete depression screening is indicated)   Over the past two weeks, have you felt down, depressed or hopeless? No  Over the past two weeks, have you felt little interest or pleasure in doing things? No Have you lost interest or pleasure in daily life? No Do you often feel hopeless? No Do you cry easily over simple problems? No    Activities of Daily Living  In your present state of health, do you have any difficulty performing the following activities?:   Driving? No  Managing money? No  Feeding yourself? No  Getting from bed to chair? No  Climbing a flight of stairs? No  Preparing food and eating?: No  Bathing or showering? No  Getting dressed: No  Getting to the toilet? No  Using the toilet:No  Moving around from place to place: No  In the past year have you fallen or had a near fall?:No  Are you sexually active?  Yes Do you have more than one partner? No   Hearing Difficulties: Yes Do you often ask people to speak up or repeat themselves?  Yes Do you experience ringing or noises in your ears?  Yes Do you have difficulty understanding soft or whispered voices? No  Do you feel that you have a problem with memory? No Do you often misplace items? No    Home Safety:  Do you have a smoke alarm at your residence? Yes Do you have grab bars in the bathroom?  No Do you have throw rugs in your house?   Cognitive Testing  Alert? Yes Normal Appearance?Yes  Oriented to person? Yes Place? Yes  Time? Yes  Recall of three objects? Yes  Can perform simple calculations? Yes  Displays appropriate judgment?Yes  Can read the correct time from a watch face?Yes   List the Names of Other Physician/Practitioners you currently use:  See referral list for the physicians patient is currently seeing.     Review of Systems: See above   Objective:     General appearance: Pleasant female in no acute distress Head: Normocephalic, without obvious abnormality, atraumatic  Eyes: conj clear, EOMi PEERLA  Ears: normal TM's and external ear canals both ears  Nose: Nares normal. Septum midline. Mucosa normal. No drainage or sinus tenderness.  Throat: lips, mucosa, and tongue normal; teeth and gums normal  Neck: no adenopathy, no carotid bruit, no JVD, supple, symmetrical, trachea midline and thyroid not enlarged,  symmetric, no tenderness/mass/nodules  No CVA tenderness.  Lungs: clear to auscultation bilaterally  Breasts: normal appearance, no masses or tenderness Heart: regular rate and rhythm, S1, S2 normal, no murmur, click, rub or gallop  Abdomen: soft, non-tender; bowel sounds normal; no masses, no organomegaly  Musculoskeletal: ROM normal in all joints, no crepitus, no deformity, Normal muscle strengthen. Back  is symmetric, no curvature. Skin: Skin color, texture, turgor normal. No rashes or lesions  Lymph nodes: Cervical, supraclavicular, and axillary nodes normal.  Neurologic: CN 2 -12 Normal, Normal symmetric reflexes. Normal coordination and gait  Psych: Alert & Oriented x 3, Mood appear stable.    Assessment:    Annual wellness medicare exam   Plan:    During the course of the visit the patient was educated and counseled about appropriate screening and preventive services including:   Needs COVID booster in the fall.  Reminded about annual mammogram.  Patient request chest x-ray.  Had colonoscopy in 2019  Patient Instructions (the written plan) was given to the patient.  Medicare Attestation  I have personally reviewed:  The patient's medical and social history  Their use of alcohol, tobacco or illicit drugs  Their current medications and supplements  The patient's functional ability including ADLs,fall risks, home safety risks, cognitive, and hearing and visual impairment  Diet and physical activities  Evidence for depression or mood disorders  The patient's weight, height, BMI, and visual acuity have been recorded in the chart. I have made referrals, counseling, and provided education to the patient based on review of the above and I have provided the patient with a written personalized care plan for preventive services.

## 2020-10-15 NOTE — Patient Instructions (Signed)
RTC in 3 months for repeat CBC and path review of blood smear with Office visit. Continue same meds. Glad you have stopped drinking alcohol.

## 2020-11-07 ENCOUNTER — Other Ambulatory Visit: Payer: Self-pay | Admitting: Internal Medicine

## 2020-12-08 ENCOUNTER — Other Ambulatory Visit: Payer: Self-pay | Admitting: Internal Medicine

## 2020-12-08 DIAGNOSIS — Z1231 Encounter for screening mammogram for malignant neoplasm of breast: Secondary | ICD-10-CM

## 2020-12-14 DIAGNOSIS — H16223 Keratoconjunctivitis sicca, not specified as Sjogren's, bilateral: Secondary | ICD-10-CM | POA: Diagnosis not present

## 2020-12-14 DIAGNOSIS — H35363 Drusen (degenerative) of macula, bilateral: Secondary | ICD-10-CM | POA: Diagnosis not present

## 2020-12-15 ENCOUNTER — Other Ambulatory Visit: Payer: Self-pay | Admitting: Internal Medicine

## 2021-01-03 ENCOUNTER — Other Ambulatory Visit: Payer: Self-pay

## 2021-01-03 ENCOUNTER — Ambulatory Visit (INDEPENDENT_AMBULATORY_CARE_PROVIDER_SITE_OTHER): Payer: PPO

## 2021-01-03 DIAGNOSIS — Z23 Encounter for immunization: Secondary | ICD-10-CM | POA: Diagnosis not present

## 2021-01-06 ENCOUNTER — Ambulatory Visit
Admission: RE | Admit: 2021-01-06 | Discharge: 2021-01-06 | Disposition: A | Discharge status: Home / Self Care | Payer: PPO | Source: Ambulatory Visit | Attending: Internal Medicine | Admitting: Internal Medicine

## 2021-01-06 DIAGNOSIS — Z1231 Encounter for screening mammogram for malignant neoplasm of breast: Secondary | ICD-10-CM

## 2021-01-17 ENCOUNTER — Other Ambulatory Visit: Payer: Self-pay

## 2021-01-17 ENCOUNTER — Other Ambulatory Visit: Payer: PPO | Admitting: Internal Medicine

## 2021-01-17 DIAGNOSIS — D708 Other neutropenia: Secondary | ICD-10-CM | POA: Diagnosis not present

## 2021-01-17 NOTE — Addendum Note (Signed)
Addended by: Angus Seller on: 01/17/2021 10:24 AM   Modules accepted: Orders

## 2021-01-18 LAB — CBC WITH DIFFERENTIAL/PLATELET
Absolute Monocytes: 355 cells/uL (ref 200–950)
Basophils Absolute: 32 cells/uL (ref 0–200)
Basophils Relative: 1 %
Eosinophils Absolute: 141 cells/uL (ref 15–500)
Eosinophils Relative: 4.4 %
HCT: 42.9 % (ref 35.0–45.0)
Hemoglobin: 14.4 g/dL (ref 11.7–15.5)
Lymphs Abs: 1533 cells/uL (ref 850–3900)
MCH: 33 pg (ref 27.0–33.0)
MCHC: 33.6 g/dL (ref 32.0–36.0)
MCV: 98.4 fL (ref 80.0–100.0)
MPV: 9.9 fL (ref 7.5–12.5)
Monocytes Relative: 11.1 %
Neutro Abs: 1139 cells/uL — ABNORMAL LOW (ref 1500–7800)
Neutrophils Relative %: 35.6 %
Platelets: 168 10*3/uL (ref 140–400)
RBC: 4.36 10*6/uL (ref 3.80–5.10)
RDW: 12.6 % (ref 11.0–15.0)
Total Lymphocyte: 47.9 %
WBC: 3.2 10*3/uL — ABNORMAL LOW (ref 3.8–10.8)

## 2021-01-18 LAB — PATHOLOGIST SMEAR REVIEW

## 2021-01-19 ENCOUNTER — Encounter: Payer: Self-pay | Admitting: Internal Medicine

## 2021-01-25 ENCOUNTER — Encounter: Payer: Self-pay | Admitting: Internal Medicine

## 2021-01-25 ENCOUNTER — Other Ambulatory Visit: Payer: Self-pay

## 2021-01-25 ENCOUNTER — Ambulatory Visit (INDEPENDENT_AMBULATORY_CARE_PROVIDER_SITE_OTHER): Payer: PPO | Admitting: Internal Medicine

## 2021-01-25 VITALS — BP 112/68 | HR 76 | Temp 97.9°F | Ht 63.0 in | Wt 138.0 lb

## 2021-01-25 DIAGNOSIS — Z8639 Personal history of other endocrine, nutritional and metabolic disease: Secondary | ICD-10-CM

## 2021-01-25 DIAGNOSIS — D708 Other neutropenia: Secondary | ICD-10-CM

## 2021-01-25 NOTE — Progress Notes (Signed)
   Subjective:    Patient ID: Alexandra Taylor, female    DOB: Oct 17, 1950, 70 y.o.   MRN: [id number]  HPI 70 year old Female for follow up of low white blood cell count.  In November 2021 white blood cell count was 4800 but she was ill at the time with RSV.  Generally white blood cell counts have ranged from 2800 to-3200 over the last several years.  In August her white blood cell count was 2800.  MCV was 102.9.  Vitamin B12 was 914 and folate was normal at 16.8.  We agreed we would repeat CBC at this time.  Her white blood cell count is now 3200.  We had pathology review of her smear feeling a few lymphocytes that are reactive.  No immature cells were identified.  Had adequate number of platelets.  Had scattered elliptocytes.  She feels well.  No new complaints.  Says she has cut back on alcohol consumption considerably.  Explained to her that since alcohol is a toxin to the bone marrow this would likely help her white blood cell count although she may have inherited a tendency to have a low white blood cell count.  Explained that we had not identified any worrisome white blood cells indicates she has leukemia or any other blood dyscrasia.   Father died   Mother passed away with complications of CHF. Review of Systems     Objective:   Physical Exam  Vital signs reviewed.  Blood pressure 112/68 pulse 76 temperature 97.9 pulse oximetry 99% weight 138 pounds  No thyromegaly.  Chest is clear to auscultation.  No carotid bruits.  Cardiac exam: Regular rate and rhythm without ectopy or murmur.  No lower extremity pitting edema.      Assessment & Plan:  History of mild leukopenia without evidence of leukemia  History of alcohol abuse-has improved recently and is not drinking hardly at all.  Plan: Continue to monitor complete blood count on a regular basis.  Due for health maintenance exam in August 2023.

## 2021-01-26 ENCOUNTER — Encounter: Payer: Self-pay | Admitting: Internal Medicine

## 2021-01-26 NOTE — Patient Instructions (Signed)
White blood cell count reviewed is stable.  Continue same dose of thyroid replacement medication.  Follow-up in August 2023 for health maintenance exam and fasting labs.  It was a pleasure to see you today.

## 2021-01-27 NOTE — Telephone Encounter (Signed)
Patient called to say she does not want a referral to Hematology that she will not pursue any further.

## 2021-02-03 ENCOUNTER — Other Ambulatory Visit: Payer: Self-pay | Admitting: Internal Medicine

## 2021-02-03 DIAGNOSIS — L84 Corns and callosities: Secondary | ICD-10-CM | POA: Diagnosis not present

## 2021-02-03 DIAGNOSIS — D485 Neoplasm of uncertain behavior of skin: Secondary | ICD-10-CM | POA: Diagnosis not present

## 2021-02-03 DIAGNOSIS — L820 Inflamed seborrheic keratosis: Secondary | ICD-10-CM | POA: Diagnosis not present

## 2021-02-03 DIAGNOSIS — L570 Actinic keratosis: Secondary | ICD-10-CM | POA: Diagnosis not present

## 2021-03-16 ENCOUNTER — Encounter: Payer: Self-pay | Admitting: Podiatry

## 2021-03-16 ENCOUNTER — Other Ambulatory Visit: Payer: Self-pay

## 2021-03-16 ENCOUNTER — Ambulatory Visit (INDEPENDENT_AMBULATORY_CARE_PROVIDER_SITE_OTHER): Payer: PPO

## 2021-03-16 ENCOUNTER — Ambulatory Visit: Payer: PPO | Admitting: Podiatry

## 2021-03-16 DIAGNOSIS — M205X1 Other deformities of toe(s) (acquired), right foot: Secondary | ICD-10-CM

## 2021-03-16 DIAGNOSIS — M21619 Bunion of unspecified foot: Secondary | ICD-10-CM | POA: Diagnosis not present

## 2021-03-16 NOTE — Progress Notes (Signed)
°  Subjective:  Patient ID: Alexandra Taylor, female    DOB: 10-04-1950,   MRN: [id number]  Chief Complaint  Patient presents with   Foot Problem    Pt is here due to bunion on right foot that is painful. Also mention corn 5th toe    71 y.o. female presents for concern of right foot bunion that is painful off and on as well as corn on left fifth digit. Relates she has been keeping padding on the toe and relates it is painful off and on as well.  . Denies any other pedal complaints. Denies n/v/f/c.   Past Medical History:  Diagnosis Date   Hyperlipidemia    Hypertension    Osteopenia     Objective:  Physical Exam: Vascular: DP/PT pulses 2/4 bilateral. CFT <3 seconds. Normal hair growth on digits. No edema.  Skin. No lacerations or abrasions bilateral feet. Hyperkeratotic lesion noted to medial fifth digit with cored area.  Musculoskeletal: MMT 5/5 bilateral lower extremities in DF, PF, Inversion and Eversion. Deceased ROM in DF of ankle joint. Adductovarus of the fifth digit. Mild HAV deformity. No pain to palaption no pain with ROM of the toe.  Neurological: Sensation intact to light touch.   Assessment:   1. Bunion   2. 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 -Xrays reviewed -Discussed hallux limitus and HAV, and adductovarus of fifth toe and  treatement options; conservative and  Surgical management; risks, benefits, alternatives discussed. All patient's questions answered.  -Recommend continue with good supportive shoes and inserts. Discussed stiff soled shoes and use of carbon fiber foot plate.  -Hyperkeratotic lesion debrided as courtesy.   -Discussed possible surgical options if have continue pain for hallux limitus as well as fifth digit.  -Toe caps dispensed.  -Patient to return to office as needed or sooner if condition worsens.    Lorenda Peck, DPM

## 2021-03-29 ENCOUNTER — Other Ambulatory Visit: Payer: Self-pay | Admitting: Podiatry

## 2021-03-29 ENCOUNTER — Encounter: Payer: Self-pay | Admitting: Internal Medicine

## 2021-03-29 DIAGNOSIS — M205X1 Other deformities of toe(s) (acquired), right foot: Secondary | ICD-10-CM

## 2021-03-31 ENCOUNTER — Ambulatory Visit (INDEPENDENT_AMBULATORY_CARE_PROVIDER_SITE_OTHER): Payer: PPO | Admitting: Internal Medicine

## 2021-03-31 ENCOUNTER — Other Ambulatory Visit: Payer: Self-pay

## 2021-03-31 ENCOUNTER — Encounter: Payer: Self-pay | Admitting: Internal Medicine

## 2021-03-31 VITALS — BP 124/78 | HR 71 | Temp 98.6°F | Wt 137.8 lb

## 2021-03-31 DIAGNOSIS — F3341 Major depressive disorder, recurrent, in partial remission: Secondary | ICD-10-CM | POA: Diagnosis not present

## 2021-03-31 MED ORDER — BUPROPION HCL ER (XL) 150 MG PO TB24: 150.0000 mg | ORAL_TABLET | Freq: Every day | ORAL | 0 refills | Status: DC

## 2021-03-31 NOTE — Patient Instructions (Addendum)
Begin Wellbutrin XL 150 mg daily in addition to Prozac .  Call us with progress report in 6 weeks.  Wellbutrin can be increased to 300 mg XL if necessary.

## 2021-03-31 NOTE — Progress Notes (Signed)
° °  Subjective:    Patient ID: Alexandra Taylor, female    DOB: 03-24-1950, 71 y.o.   MRN: [id number]  HPI 71 year old Female seen today to discuss mediation management for depression. Says husband has noticed that she does not seem to be herself - i.e. a bit of flat affect as best as I gather.  Has been on generic Prozac for a long time.  She messaged me recently on January 31 asking if I could change her medication.  She said she would like to try Wellbutrin.  She did not think generic Prozac was working for her anymore.  I messaged back on January 31 that it was not advisable to stop SSRI abruptly as she could have discontinuation syndrome with significant side effects.  I asked her to make an appointment and she is seen today. Apparently appetite is okay and energy level is okay.  She tells me she is not drinking alcohol at the present time.  She has a history of hypothyroidism, hypertension and attention deficit.  She took Adderall for many years through Dr. Lajuana Ripple and I also refilled her Adderall for a number of years.  She discontinued it sometime in 2013.  Dr. Lajuana Ripple also had her on Prozac for a number of years.  All records from 2013 indicates she tried to discontinue it and had discontinuation symptoms.  We subsequently continued it at 20 mg daily.  Explained to her today that Wellbutrin may be appropriate to add.  Prozac increases serotonin and Wellbutrin would be helpful in increasing dopamine.   Review of Systems she denies lack of appetite, crying spells, continues to read and writes good letters to the [employer] the [employer]     Objective:   Physical Exam Blood pressure 124/78 pulse 71 regular temperature 98.6 degrees pulse oximetry 97%  Her affect thought and judgment appear to be normal today.       Assessment & Plan:   Discussion regarding adding Wellbutrin to long-term use of Prozac to see if additional benefits can be obtained in terms of affect and  happiness  Plan: Start Wellbutrin XL 150 mg daily and suggest when he reevaluate in 6 weeks.  Continue Prozac 20 mg daily.  Going forward options include increasing Wellbutrin as well as increasing Prozac.  It can take several weeks to see a change in neurotransmitter levels.  She can call us in 4 to 6 weeks with a progress report.

## 2021-04-18 DIAGNOSIS — H903 Sensorineural hearing loss, bilateral: Secondary | ICD-10-CM | POA: Diagnosis not present

## 2021-04-28 DIAGNOSIS — D485 Neoplasm of uncertain behavior of skin: Secondary | ICD-10-CM | POA: Diagnosis not present

## 2021-04-28 DIAGNOSIS — L400 Psoriasis vulgaris: Secondary | ICD-10-CM | POA: Diagnosis not present

## 2021-04-28 DIAGNOSIS — Z85828 Personal history of other malignant neoplasm of skin: Secondary | ICD-10-CM | POA: Diagnosis not present

## 2021-04-28 DIAGNOSIS — L570 Actinic keratosis: Secondary | ICD-10-CM | POA: Diagnosis not present

## 2021-04-28 DIAGNOSIS — L821 Other seborrheic keratosis: Secondary | ICD-10-CM | POA: Diagnosis not present

## 2021-04-28 DIAGNOSIS — L814 Other melanin hyperpigmentation: Secondary | ICD-10-CM | POA: Diagnosis not present

## 2021-04-28 DIAGNOSIS — D235 Other benign neoplasm of skin of trunk: Secondary | ICD-10-CM | POA: Diagnosis not present

## 2021-04-28 DIAGNOSIS — D225 Melanocytic nevi of trunk: Secondary | ICD-10-CM | POA: Diagnosis not present

## 2021-04-28 DIAGNOSIS — Z08 Encounter for follow-up examination after completed treatment for malignant neoplasm: Secondary | ICD-10-CM | POA: Diagnosis not present

## 2021-04-29 ENCOUNTER — Telehealth: Payer: Self-pay

## 2021-04-29 NOTE — Telephone Encounter (Signed)
Patient states that she has been having dizzy spells off and on for the past month. She tried to make an appt with the ENT and they are requiring a referral. She has been advised that she would need an appt.  ?

## 2021-05-02 ENCOUNTER — Ambulatory Visit (INDEPENDENT_AMBULATORY_CARE_PROVIDER_SITE_OTHER): Payer: PPO | Admitting: Internal Medicine

## 2021-05-02 ENCOUNTER — Encounter: Payer: Self-pay | Admitting: Internal Medicine

## 2021-05-02 ENCOUNTER — Other Ambulatory Visit: Payer: Self-pay

## 2021-05-02 VITALS — Temp 98.1°F | Ht 63.0 in | Wt 137.2 lb

## 2021-05-02 DIAGNOSIS — Z8639 Personal history of other endocrine, nutritional and metabolic disease: Secondary | ICD-10-CM

## 2021-05-02 DIAGNOSIS — Z8659 Personal history of other mental and behavioral disorders: Secondary | ICD-10-CM | POA: Diagnosis not present

## 2021-05-02 DIAGNOSIS — F1011 Alcohol abuse, in remission: Secondary | ICD-10-CM

## 2021-05-02 DIAGNOSIS — H8110 Benign paroxysmal vertigo, unspecified ear: Secondary | ICD-10-CM | POA: Diagnosis not present

## 2021-05-02 DIAGNOSIS — R42 Dizziness and giddiness: Secondary | ICD-10-CM

## 2021-05-02 DIAGNOSIS — I10 Essential (primary) hypertension: Secondary | ICD-10-CM | POA: Diagnosis not present

## 2021-05-02 DIAGNOSIS — H903 Sensorineural hearing loss, bilateral: Secondary | ICD-10-CM

## 2021-05-02 DIAGNOSIS — E039 Hypothyroidism, unspecified: Secondary | ICD-10-CM | POA: Diagnosis not present

## 2021-05-02 MED ORDER — MECLIZINE HCL 25 MG PO TABS
25.0000 mg | ORAL_TABLET | Freq: Three times a day (TID) | ORAL | 0 refills | Status: DC | PRN
Start: 2021-05-02 — End: 2022-05-30

## 2021-05-02 NOTE — Progress Notes (Signed)
Antivert 25 ? ? ?Subjective:  ? ? Patient ID: Alexandra Taylor, female    DOB: 1950-12-02, 71 y.o.   MRN: [id number] ? ?HPI  Patient has remote history of vertigo some 30 years ago seen by physician in [place].  Had Epley maneuver at that time and improved.  Patient now has vertigo again for at least a month. ? ?Denies nausea or vomiting.  Denies any head trauma.  Currently is not drinking alcohol.  Recently went to a hearing aid office and apparently was inserted in the ear canals.  Says vertigo has been much worse since that time. ? ?Currently not drinking alcohol.  CBC with differential TSH and sed rate drawn today with results pending.  In early February was seen for management of depression.  Wellbutrin XL 150 mg was added to Prozac.  Repeat office visit advised to 6 weeks from February 2. ? ?History of benign neutropenia.  Pathology reviewed smear in November and no immature cells were identified. ? ?History of hypothyroidism treated with levothyroxine 50 mcg daily.  TSH was normal in August 2022 ? ? ? ? ?Review of Systems History of depression but that has improved. ? ?   ?Objective:  ? Physical Exam blood pressure 144/78 left arm lying pulse 78, sitting blood pressure 130/78 pulse 72, standing blood pressure 140/72 pulse 68 ?She does have a decrease of 14 mmHg systolically when going from supine to sitting but this does not drop when she is standing.  It goes up to 140/72. ?She is afebrile.  Pulse oximetry 98% on room air. ? ?Respiratory rate is normal and she is in no acute distress. ? ?Extraocular movements are full.  No nystagmus.  Brief neurological exam is without focal deficits.  PERRLA.  Muscle strength is normal.  Moves all 4 extremities without difficulty.  Chest clear.  Cardiac exam regular rate and rhythm.  No ataxia. ?   ?Assessment & Plan:  ?Alexandra Taylor would like to be referred to ENT physician for evaluation of vertigo.  She thinks she may need an Epley maneuver.  CBC with differential  TSH and sed rate drawn today.  Patient will take Antivert 25 mg 3 times a day as needed for dizziness.  No change in thyroid replacement medication, Prozac or Wellbutrin therapy. ? ?Complaint of decreased hearing-had hearing evaluation recently and says probe was inserted in both ear canals and vertigo got worse after that. ? ?Wellbutrin was added at last visit on February 2 but in my experience this does not generally cause acute vertigo. ? ?Anxiety and depression treated with Prozac and Wellbutrin. ? ?History of alcohol abuse-denies using alcohol at this time ? ?Benign neutropenia-has had peripheral review of smear ? ?Patient will be notified of lab results and will take Antivert 25 mg 3 times a day as needed. ? ?Hopefully, symptoms will improve in 7 to 10 days.  Notify us if symptoms worsen before that.  Patient request referral to ENT physician. ? ?

## 2021-05-02 NOTE — Telephone Encounter (Signed)
scheduled

## 2021-05-02 NOTE — Patient Instructions (Signed)
Take Antivert 25 mg 3 times a day as needed for dizziness.  Labs drawn and pending including CBC with differential, TSH and sed rate.  Be careful with position change.  Continue Prozac and Wellbutrin.  Continue thyroid replacement medication.  Call if not better in 7 to 10 days or sooner if worse. ? ?At patient request, referral will be made to ENT physician. ?

## 2021-05-02 NOTE — Telephone Encounter (Signed)
Do you want me to schedule her for 15 or 30 minute appointment? ?

## 2021-05-02 NOTE — Progress Notes (Signed)
Orthostatic VS for the past 72 hrs (Last 3 readings): ? Orthostatic BP Patient Position BP Location Cuff Size Orthostatic Pulse  ?05/02/21 1610 140/72 Standing Left Arm Normal 68  ?05/02/21 1609 130/78 Sitting Left Arm Normal 72  ?05/02/21 1608 144/78 Supine Left Arm Normal 78  ?  ?

## 2021-05-03 ENCOUNTER — Encounter: Payer: Self-pay | Admitting: Internal Medicine

## 2021-05-03 LAB — CBC WITH DIFFERENTIAL/PLATELET
Absolute Monocytes: 431 cells/uL (ref 200–950)
Basophils Absolute: 29 cells/uL (ref 0–200)
Basophils Relative: 0.6 %
Eosinophils Absolute: 123 cells/uL (ref 15–500)
Eosinophils Relative: 2.5 %
HCT: 41.8 % (ref 35.0–45.0)
Hemoglobin: 14.2 g/dL (ref 11.7–15.5)
Lymphs Abs: 1548 cells/uL (ref 850–3900)
MCH: 33.5 pg — ABNORMAL HIGH (ref 27.0–33.0)
MCHC: 34 g/dL (ref 32.0–36.0)
MCV: 98.6 fL (ref 80.0–100.0)
MPV: 10.6 fL (ref 7.5–12.5)
Monocytes Relative: 8.8 %
Neutro Abs: 2769 cells/uL (ref 1500–7800)
Neutrophils Relative %: 56.5 %
Platelets: 196 10*3/uL (ref 140–400)
RBC: 4.24 10*6/uL (ref 3.80–5.10)
RDW: 11.9 % (ref 11.0–15.0)
Total Lymphocyte: 31.6 %
WBC: 4.9 10*3/uL (ref 3.8–10.8)

## 2021-05-03 LAB — TSH: TSH: 0.82 mIU/L (ref 0.40–4.50)

## 2021-05-03 LAB — SEDIMENTATION RATE: Sed Rate: 6 mm/h (ref 0–30)

## 2021-06-20 ENCOUNTER — Other Ambulatory Visit: Payer: Self-pay | Admitting: Internal Medicine

## 2021-06-27 ENCOUNTER — Other Ambulatory Visit: Payer: Self-pay | Admitting: Internal Medicine

## 2021-08-11 ENCOUNTER — Other Ambulatory Visit: Payer: Self-pay | Admitting: Internal Medicine

## 2021-08-11 DIAGNOSIS — H8113 Benign paroxysmal vertigo, bilateral: Secondary | ICD-10-CM | POA: Diagnosis not present

## 2021-08-11 DIAGNOSIS — H903 Sensorineural hearing loss, bilateral: Secondary | ICD-10-CM | POA: Diagnosis not present

## 2021-08-17 DIAGNOSIS — L570 Actinic keratosis: Secondary | ICD-10-CM | POA: Diagnosis not present

## 2021-08-17 DIAGNOSIS — L821 Other seborrheic keratosis: Secondary | ICD-10-CM | POA: Diagnosis not present

## 2021-08-24 DIAGNOSIS — H8112 Benign paroxysmal vertigo, left ear: Secondary | ICD-10-CM | POA: Diagnosis not present

## 2021-08-24 DIAGNOSIS — R279 Unspecified lack of coordination: Secondary | ICD-10-CM | POA: Diagnosis not present

## 2021-08-25 ENCOUNTER — Telehealth: Payer: Self-pay | Admitting: Internal Medicine

## 2021-08-25 NOTE — Telephone Encounter (Signed)
This message was sent via [place], a product from [employer] [email or web address]://www.biscom.com/                    -------Fax Transmission Report-------  To:               Recipient at [phone] Subject:          FW: Hp Scans Result:           The transmission was successful. Explanation:      All Pages Ok Pages Sent:       4 Connect Time:     2 minutes, 5 seconds Transmit Time:    08/25/2021 10:04 Transfer Rate:    14400 Status Code:      0000 Retry Count:      0 Job Id:           [id number] Unique Id:        [id number] Fax Line:         11 Fax Server:       [employer]

## 2021-08-25 NOTE — Telephone Encounter (Signed)
Faxed signed PT orders to [employer] [phone], phone [phone]

## 2021-09-26 ENCOUNTER — Other Ambulatory Visit: Payer: Self-pay | Admitting: Internal Medicine

## 2021-09-27 ENCOUNTER — Other Ambulatory Visit: Payer: Self-pay | Admitting: Internal Medicine

## 2021-10-11 ENCOUNTER — Other Ambulatory Visit: Payer: PPO

## 2021-10-11 DIAGNOSIS — Z8639 Personal history of other endocrine, nutritional and metabolic disease: Secondary | ICD-10-CM | POA: Diagnosis not present

## 2021-10-11 DIAGNOSIS — I10 Essential (primary) hypertension: Secondary | ICD-10-CM | POA: Diagnosis not present

## 2021-10-12 LAB — CBC WITH DIFFERENTIAL/PLATELET
Absolute Monocytes: 287 cells/uL (ref 200–950)
Basophils Absolute: 30 cells/uL (ref 0–200)
Basophils Relative: 0.9 %
Eosinophils Absolute: 178 cells/uL (ref 15–500)
Eosinophils Relative: 5.4 %
HCT: 40 % (ref 35.0–45.0)
Hemoglobin: 13.9 g/dL (ref 11.7–15.5)
Lymphs Abs: 1234 cells/uL (ref 850–3900)
MCH: 34.4 pg — ABNORMAL HIGH (ref 27.0–33.0)
MCHC: 34.8 g/dL (ref 32.0–36.0)
MCV: 99 fL (ref 80.0–100.0)
MPV: 10 fL (ref 7.5–12.5)
Monocytes Relative: 8.7 %
Neutro Abs: 1571 cells/uL (ref 1500–7800)
Neutrophils Relative %: 47.6 %
Platelets: 177 10*3/uL (ref 140–400)
RBC: 4.04 10*6/uL (ref 3.80–5.10)
RDW: 13 % (ref 11.0–15.0)
Total Lymphocyte: 37.4 %
WBC: 3.3 10*3/uL — ABNORMAL LOW (ref 3.8–10.8)

## 2021-10-12 LAB — COMPLETE METABOLIC PANEL WITH GFR
AG Ratio: 1.8 (calc) (ref 1.0–2.5)
ALT: 34 U/L — ABNORMAL HIGH (ref 6–29)
AST: 46 U/L — ABNORMAL HIGH (ref 10–35)
Albumin: 4.3 g/dL (ref 3.6–5.1)
Alkaline phosphatase (APISO): 119 U/L (ref 37–153)
BUN: 14 mg/dL (ref 7–25)
CO2: 24 mmol/L (ref 20–32)
Calcium: 9.7 mg/dL (ref 8.6–10.4)
Chloride: 101 mmol/L (ref 98–110)
Creat: 0.96 mg/dL (ref 0.60–1.00)
Globulin: 2.4 g/dL (calc) (ref 1.9–3.7)
Glucose, Bld: 84 mg/dL (ref 65–99)
Potassium: 5.2 mmol/L (ref 3.5–5.3)
Sodium: 135 mmol/L (ref 135–146)
Total Bilirubin: 0.9 mg/dL (ref 0.2–1.2)
Total Protein: 6.7 g/dL (ref 6.1–8.1)
eGFR: 64 mL/min/{1.73_m2} (ref >=60)

## 2021-10-12 LAB — LIPID PANEL
Cholesterol: 214 mg/dL — ABNORMAL HIGH (ref <200)
HDL: 125 mg/dL (ref >=50)
LDL Cholesterol (Calc): 78 mg/dL (calc)
Non-HDL Cholesterol (Calc): 89 mg/dL (calc) (ref <130)
Total CHOL/HDL Ratio: 1.7 (calc) (ref <5.0)
Triglycerides: 37 mg/dL (ref <150)

## 2021-10-12 LAB — TSH: TSH: 1.68 mIU/L (ref 0.40–4.50)

## 2021-10-17 ENCOUNTER — Ambulatory Visit
Admission: RE | Admit: 2021-10-17 | Discharge: 2021-10-17 | Disposition: A | Discharge status: Home / Self Care | Payer: PPO | Source: Ambulatory Visit | Attending: Internal Medicine | Admitting: Internal Medicine

## 2021-10-17 ENCOUNTER — Encounter: Payer: Self-pay | Admitting: Internal Medicine

## 2021-10-17 ENCOUNTER — Ambulatory Visit (INDEPENDENT_AMBULATORY_CARE_PROVIDER_SITE_OTHER): Payer: PPO | Admitting: Internal Medicine

## 2021-10-17 VITALS — BP 110/76 | HR 65 | Temp 98.3°F | Ht 64.25 in | Wt 139.4 lb

## 2021-10-17 DIAGNOSIS — R059 Cough, unspecified: Secondary | ICD-10-CM | POA: Diagnosis not present

## 2021-10-17 DIAGNOSIS — Z8659 Personal history of other mental and behavioral disorders: Secondary | ICD-10-CM

## 2021-10-17 DIAGNOSIS — R945 Abnormal results of liver function studies: Secondary | ICD-10-CM

## 2021-10-17 DIAGNOSIS — Z0000 Encounter for general adult medical examination without abnormal findings: Secondary | ICD-10-CM

## 2021-10-17 DIAGNOSIS — Z8639 Personal history of other endocrine, nutritional and metabolic disease: Secondary | ICD-10-CM | POA: Diagnosis not present

## 2021-10-17 LAB — POCT URINALYSIS DIPSTICK
Bilirubin, UA: NEGATIVE
Blood, UA: NEGATIVE
Glucose, UA: NEGATIVE
Ketones, UA: NEGATIVE
Leukocytes, UA: NEGATIVE
Nitrite, UA: NEGATIVE
Protein, UA: NEGATIVE
Spec Grav, UA: 1.015 (ref 1.010–1.025)
Urobilinogen, UA: 0.2 E.U./dL
pH, UA: 6.5 (ref 5.0–8.0)

## 2021-10-17 NOTE — Progress Notes (Signed)
Annual Wellness Visit     Patient: Alexandra Taylor, Female    DOB: 1950/11/01, 71 y.o.   MRN: [id number] Visit Date: 10/17/2021   Subjective    Alexandra Taylor is a 71 y.o. female who presents today for her Annual Wellness Visit.  HPI 71 year old Female seen for Medicare wellness visit by CMA and also needs to see me about 2 concerns. One is a protracted cough without fever. No Hx of Covid -19 but cough is slightly productive and pt. Is concerned.The other issue I elevated liver functions. She has a hx of alcohol consumption but reports less drinking over past several months.Patient does not smoke.  Labs associated with this visit include a normal urine dipstick.  Also drawn was a CBC with differential showing white blood cell count of 3300.  Patient has had low white blood cell counts previously over the past 3 years.  Occasionally her white blood cell count is 4840 900 but generally in the 3000 range.  MCV is normal.  Hemoglobin is normal as well for Female.  Platelet count is normal.  History of dysthymia treated with Prozac.  History of allergic rhinitis treated with Flonase.   History of mild hypertension treated with ramipril 10 mg daily.  Patient quit smoking in 1982.  She called [facility] about CT screening for smoking and they do not screen by CT unless it had been less than 15 years since she smoked.  She quit smoking in 1982 and therefore did not qualify through [facility].  We offered [facility] as an alternative but I am not sure she had screening there.  She brings this up once again today.  A chest x-ray was done with this visit.  Both lungs were clear and heart size was normal.  No active cardiopulmonary disease.  Regarding her fasting lipid panel, her HDL is high at 125.  She does watch her weight and exercises.  Her total cholesterol is 214 reflecting high HDL.  Triglycerides are 37 and LDL is low at 78.  Renal functions are  normal.  Electrolytes are normal.  Regarding mild elevation of liver functions, SGOT is 46 and SGPT is 34.  These are about the same as previous values.   She saw rheumatologist in late 2019 regarding polyarthralgias and osteoarthritis.  CCP was normal and it was felt she had osteoarthritis.  History of hypothyroidism, hypertension and osteopenia.  She was diagnosed with hyperthyroidism in 2004 and underwent a hemithyroidectomy resulting in hypothyroidism.  Had colonoscopy in 2019.  Social history: She is married.  Does not smoke.  Saw Dr. Mardelle Matte April 2019 with neck pain evaluated with MRI and findings were consistent with osteoarthritis.  History of chronic benign neutropenia and peripheral smear has been reviewed in the past by pathology felt to be unremarkable.  She started antihypertensive medication in 2010.   History: Mother with history of hypertension, stroke, MI fractured hip and anemia.  Father died at age 21 with history of dementia and had arthritis of his hands.     Patient Care Team: Elby Showers, MD as PCP - General (Internal Medicine)  Review of Systems see above   Objective    Vitals: BP 110/76   Pulse 65   Temp 98.3 F (36.8 C) (Tympanic)   Ht 5' 4.25" (1.632 m)   Wt 139 lb 6.4 oz (63.2 kg)   SpO2 99%   BMI 23.74 kg/m   Physical Exam  vital signs reviewed.  Her chest is clear to auscultation without rales or wheezing.  Cardiac exam: Regular rate and rhythm without ectopy.  Abdomen soft, nondistended without hepatosplenomegaly masses or tenderness.  No lower extremity pitting edema.  Affect thought and judgment appear to be normal.   Most recent functional status assessment:    10/17/2021   11:04 AM  In your present state of health, do you have any difficulty performing the following activities:  Hearing? 0  Vision? 0  Difficulty concentrating or making decisions? 0  Walking or climbing stairs? 0  Dressing or bathing? 0  Doing errands, shopping?  0  Preparing Food and eating ? N  Using the Toilet? N  In the past six months, have you accidently leaked urine? Y  Do you have problems with loss of bowel control? N  Managing your Medications? N  Managing your Finances? N  Housekeeping or managing your Housekeeping? N   Most recent fall risk assessment:    10/17/2021   11:03 AM  Fall Risk   Falls in the past year? 0  Number falls in past yr: 0  Injury with Fall? 0  Risk for fall due to : No Fall Risks  Follow up Falls evaluation completed    Most recent depression screenings:    10/17/2021   11:03 AM 10/15/2020   11:15 AM  PHQ 2/9 Scores  PHQ - 2 Score 0 0   Most recent cognitive screening:    10/17/2021   11:05 AM  6CIT Screen  What Year? 0 points  What month? 0 points  What time? 0 points  Count back from 20 0 points  Months in reverse 0 points  Repeat phrase 0 points  Total Score 0 points       Assessment & Plan   Mild elevation of liver functions-history of consuming alcohol.  May have fatty liver disease and will have ultrasound of the liver.  She is concerned about this.  Cough-etiology unclear will have chest x-ray.  Continue with Flonase nasal spray.  Not treated with antibiotics.  Hypertension treated with Altace 10 mg daily  Hypothyroidism status post hemithyroidectomy treated with levothyroxine 50 mcg daily and stable  History of allergic rhinitis treated with Flonase  Dysthymia treated with Wellbutrin  Plan: She will have liver ultrasound and chest x-ray further instructions to follow.  Addendum: Chest x-ray shows heart size to be normal and both lungs are clear.  Liver ultrasound shows no gallstones.  Size of common bile duct 7.5 mm mildly prominent.  Mild intrahepatic biliary ductal prominence.  Course echotexture.  Suggestive of chronic liver disease.  Need clarification of report with radiologist.      Annual wellness visit done today including the all of the following: Reviewed  patient's Family Medical History Reviewed and updated list of patient's medical providers Assessment of cognitive impairment was done Assessed patient's functional ability Established a written schedule for health screening [place] Completed and Reviewed  Discussed health benefits of physical activity, and encouraged her to engage in regular exercise appropriate for her age and condition.         IElby Showers, MD, have reviewed all documentation for this visit. The documentation on 10/23/21 for the exam, diagnosis, procedures, and orders are all accurate and complete.   LaVon Barron Alvine, CMA

## 2021-10-17 NOTE — Progress Notes (Deleted)
   Subjective:    Patient ID: Alexandra Taylor, female    DOB: 08-Sep-1950, 71 y.o.   MRN: [id number]  HPI 46- year old Female seen for Medicare wellness visit with CMA and also has a couple of concerns that she would like to discuss with me   Declines Shingrix vaccine says she has had shingles 3 times and does not want to take vaccine Review of Systems     Objective:   Physical Exam        Assessment & Plan:   Cough- will have CXR    Patient worried about liver tests- will have liver ultrasound    Plan: I, Elby Showers, MD, have reviewed all documentation for this visit. The documentation on 10/23/21 for the exam, diagnosis, procedures, and orders are all accurate and complete.

## 2021-10-21 ENCOUNTER — Ambulatory Visit
Admission: RE | Admit: 2021-10-21 | Discharge: 2021-10-21 | Disposition: A | Discharge status: Home / Self Care | Payer: PPO | Source: Ambulatory Visit | Attending: Internal Medicine | Admitting: Internal Medicine

## 2021-10-21 DIAGNOSIS — R945 Abnormal results of liver function studies: Secondary | ICD-10-CM | POA: Diagnosis not present

## 2021-10-23 NOTE — Patient Instructions (Signed)
Radiology to clarify report regarding liver ultrasound.  Chest x-ray negative.  Continue Altace for hypertension.  Continue Flonase nasal spray.  If cough persist, may need allergy evaluation.  Return in 1 year or as needed.

## 2021-10-27 ENCOUNTER — Telehealth: Payer: Self-pay | Admitting: Gastroenterology

## 2021-10-27 NOTE — Telephone Encounter (Signed)
I will be on the look out for those records. Thanks.

## 2021-10-27 NOTE — Telephone Encounter (Signed)
Hi Dr. Tarri Glenn,  We received records from this patient who is requesting a transfer of care to you.  She prefers a female doctor, plus she wants to keep all of her care within the [facility].  Her records will be forwarded to you for review.  Please let me know how you would like to proceed.    Thank you.

## 2021-11-01 NOTE — Telephone Encounter (Signed)
Reviewed records from [place] with colon cancer in his 75s  Colonoscopy 12/11/2017 with Dr. Deno Etienne showed sigmoid diverticulosis and was otherwise normal to the terminal ileum.  Surveillance recommended in 5 years given the family history.  Surveillance colonoscopy due October 2024.  It is okay to schedule this procedure with me.

## 2021-12-01 ENCOUNTER — Other Ambulatory Visit: Payer: Self-pay | Admitting: Internal Medicine

## 2021-12-16 ENCOUNTER — Ambulatory Visit: Payer: PPO | Admitting: Gastroenterology

## 2021-12-29 DIAGNOSIS — L409 Psoriasis, unspecified: Secondary | ICD-10-CM | POA: Diagnosis not present

## 2021-12-29 DIAGNOSIS — Z6824 Body mass index (BMI) 24.0-24.9, adult: Secondary | ICD-10-CM | POA: Diagnosis not present

## 2021-12-29 DIAGNOSIS — M154 Erosive (osteo)arthritis: Secondary | ICD-10-CM | POA: Diagnosis not present

## 2021-12-29 DIAGNOSIS — M79645 Pain in left finger(s): Secondary | ICD-10-CM | POA: Diagnosis not present

## 2022-01-12 DIAGNOSIS — M65332 Trigger finger, left middle finger: Secondary | ICD-10-CM | POA: Insufficient documentation

## 2022-01-12 DIAGNOSIS — M65342 Trigger finger, left ring finger: Secondary | ICD-10-CM | POA: Diagnosis not present

## 2022-01-24 ENCOUNTER — Encounter: Payer: Self-pay | Admitting: Gastroenterology

## 2022-01-24 ENCOUNTER — Ambulatory Visit (INDEPENDENT_AMBULATORY_CARE_PROVIDER_SITE_OTHER): Payer: PPO | Admitting: Gastroenterology

## 2022-01-24 VITALS — BP 128/82 | HR 67 | Ht 63.0 in | Wt 139.0 lb

## 2022-01-24 DIAGNOSIS — R932 Abnormal findings on diagnostic imaging of liver and biliary tract: Secondary | ICD-10-CM | POA: Diagnosis not present

## 2022-01-24 DIAGNOSIS — R748 Abnormal levels of other serum enzymes: Secondary | ICD-10-CM

## 2022-01-24 DIAGNOSIS — R252 Cramp and spasm: Secondary | ICD-10-CM | POA: Diagnosis not present

## 2022-01-24 NOTE — Patient Instructions (Addendum)
It was a pleasure to meet you today.  I have recommended labs and imaging studies for further evaluation of your liver. You will need to have these done fasting. Press "B" on the elevator. The lab is located at the first door on the left as you exit the elevator.  You have been scheduled for an abdominal ultrasound at [facility] (1st floor of hospital) on Friday 02/03/22 at 10:30 am. Please arrive 30 minutes prior to your appointment for registration. Make certain not to have anything to eat or drink 6 hours prior to your appointment. Should you need to reschedule your appointment, please contact radiology at [phone]. This test typically takes about 30 minutes to perform.  You have been scheduled for an MRI at [facility] on Wednesday 02/01/22. Your appointment time is 3 pm. Please arrive to admitting (at main entrance of the hospital) 30 minutes prior to your appointment time for registration purposes. Please make certain not to have anything to eat or drink 4 hours prior to your test. In addition, if you have any metal in your body, have a pacemaker or defibrillator, please be sure to let your ordering physician know. This test typically takes 45 minutes to 1 hour to complete. Should you need to reschedule, please call [phone] to do so.  I recommend a high-protein, primarily plant-based diet.  Avoid red meat.  No raw or undercooked meat, seafood, or shellfish.  Work to maintain a health weight. Minimize salt intake.  Please do not consume more than 2000 mg of sodium every day.  Stay active. Weight-based exercise for 30 minutes at least 3 days a week is recommended.  I recommend that you not drink any alcohol including beer, wine, liquor, and non-alcoholic beer. I recommend that you see someone for formal alcohol counseling.   I recommend that you get the flu vaccine.   The [employer] and UpToDate are good websites for information about your liver and  suspected cirrhosis.    Treatment options for muscle cramps in patients with cirrhosis including taurine taken three times daily, quinidine, and sips of pickle juice at cramp onset. Sips of kosher or dill pickle brine should not exceed 1 tablespoon.   I would like to see you back in office after your MRI and elastography.

## 2022-01-24 NOTE — Progress Notes (Signed)
Referring Provider: Elby Showers, MD Primary Care Physician:  Elby Showers, MD   Reason for Consultation: Abnormal liver ultrasound   IMPRESSION:  Abnormal ultrasound with concerns for cirrhosis    - APRI 0.743 (may suggest advanced fibrosis)    - FIB4 3.12 (not diagnostic of cirrhosis or no fibrosis) Ongoing alcohol use Muscle cramps - ? Related to cirrhosis Elevated transaminases with suspected hepatocellular process Insomnia with difficulty falling asleep    - pattern atypical for hepatic encephalopathy Sigmoid diverticulosis on colonoscopy with Dr. Therisa Doyne 2019 Brother with colon cancer in his 98s  PLAN: - Elastography given concerns for cirrhosis - MRI - to fully evaluate hepatic parenchyma as recommended by radiologist - Will proceed with labs to evaluate for common causes of hepatocellular injury including: viral hepatitis serologies (HCV antibody testing, HBsAb, HBsAg, HBcAb, HAV Ab), iron, ferritin, fasting insulin, fasting glucose,and autoimmune type I (ANA, AMA, ASMA, IgG). AFP - She may ultimately need an EGD to screen for esophageal varices if cirrhosis confirmed - Flu vaccine recommended - Office follow-up after MRI and elastography - Surveillance colonoscopy 2024 given the family history of colon cancer - See patient instructions for specific lifestyle recommendations   HPI: Alexandra Taylor is a 71 y.o. female referred by Dr. Renold Genta for further evaluation of an abnormal liver ultrasound.  The history is obtained through the patient, review of her electronic health record, review of records from Dr. Therisa Doyne. She was previously seen by Dr. Ferdinand Lango for a colonoscopy in the distant past. 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  Routine labs with Dr. Renold Genta revealed abnormal transaminases not more than twice normal. Dr. Renold Genta told her this was due to alcohol. She admits to drinking more alcohol during and after Covid. She drinks wine daily - usually 4  glasses a day. During Covid she was drinking 6 glasses daily. No prior counseling or substance abuse therapy. No prior DUI. No Dts or seizures. The longest she has been able to be sober is 6 months. She drinks alcohol to treat insomnia and for social interactions. She is afraid if she quit drinking she would be a hermit.  Evaluation with an ultrasound 10/24/2021 showed an echogenic liver with nodular contours.  The sonographic appearance suggest chronic liver disease and possible cirrhosis.  No mass seen.  The radiologist recommended an MRI.  No further abdominal imaging has been performed.  Blood donation 5-6 years ago.  No prior blood transfusion.  No history of jaundice or scleral icterus.  No history of use or experimentation with IV or intranasal street drugs.  No history of autoimmune disease.  No family history of liver disease.   Labs 10/11/2021: Sodium 135, creatinine 0.96, total bilirubin 0.9, AST 46, ALT 34, alk phos 119, hemoglobin 13.9, platelets 177  Colonoscopy with Dr. Ferdinand Lango in 2009 may have revealed a polyp (results not available to me).   Colonoscopy 12/11/2017 with Dr. Therisa Doyne showed sigmoid diverticulosis and was otherwise normal to the terminal ileum.  Surveillance recommended in 5 years given the family history.  No falls. Difficulty falling asleep. Frequent muscle cramps that resolve with walking. No pruritus.   She takes a daily probiotic for a health gut.   Brother with colon cancer in his 82s. There is no other known family history of colon cancer or polyps. No family history of stomach cancer or other GI malignancy. No family history of inflammatory bowel disease or celiac.   She had the flu vaccine in the  past but not recently. No prior vaccination for HAV or HBV. She had the Pneumovax. She had the prior Covid vaccines.   She walks a mile daily to keep her husband healthy. No special diets. Avoid red meet. Drinks one cup of coffee daily.    Past Medical History:   Diagnosis Date   Hyperlipidemia    Hypertension    Osteopenia     Past Surgical History:  Procedure Laterality Date   hemithyroidectomy      Current Outpatient Medications  Medication Sig Dispense Refill   ASHWAGANDHA PO Take by mouth.     b complex vitamins tablet Take 1 tablet by mouth daily.     BIOTIN 5000 PO Take by mouth.     buPROPion (WELLBUTRIN XL) 150 MG 24 hr tablet TAKE 1 TABLET BY MOUTH EVERY DAY 90 tablet 0   fish oil-omega-3 fatty acids 1000 MG capsule Take 2 g by mouth daily.     FLUoxetine (PROZAC) 20 MG capsule TAKE 2 CAPSULES BY MOUTH EVERY DAY 180 capsule 2   fluticasone (FLONASE) 50 MCG/ACT nasal spray Place 1 spray into both nostrils as needed. 1 g 5   levothyroxine (SYNTHROID) 50 MCG tablet TAKE 1 TABLET BY MOUTH EVERY DAY BEFORE BREAKFAST 90 tablet 1   meclizine (ANTIVERT) 25 MG tablet Take 1 tablet (25 mg total) by mouth 3 (three) times daily as needed for dizziness. 30 tablet 0   ramipril (ALTACE) 10 MG capsule TAKE 1 CAPSULE BY MOUTH EVERY DAY 90 capsule 1   No current facility-administered medications for this visit.    Allergies as of 01/24/2022   (No Known Allergies)    Family History  Problem Relation Age of Onset   Hypertension Mother     Social History   Socioeconomic History   Marital status: Married    Spouse name: Not on file   Number of children: Not on file   Years of education: Not on file   Highest education level: Not on file  Occupational History   Not on file  Tobacco Use   Smoking status: Former    Packs/day: 1.00    Years: 15.00    Total pack years: 15.00    Types: Cigarettes    Quit date: 02/28/1984    Years since quitting: 37.9   Smokeless tobacco: Never  Substance and Sexual Activity   Alcohol use: Yes   Drug use: No   Sexual activity: Yes  Other Topics Concern   Not on file  Social History Narrative   Not on file   Social Determinants of Health   Financial Resource Strain: Not on file  Food Insecurity:  Not on file  Transportation Needs: Not on file  Physical Activity: Not on file  Stress: Not on file  Social Connections: Not on file  Intimate Partner Violence: Not on file    Review of Systems: 12 system ROS is negative except as noted above with the addition of arthritis, depression, hearing problems, sleeping problems, and urine leakage.   Physical Exam: General:   Alert, in NAD. No scleral icterus. No bilateral temporal wasting.  Heart:  Regular rate and rhythm; no murmurs Pulm: Clear anteriorly; no wheezing Abdomen:  Soft. Nontender. Nondistended. Normal bowel sounds. No rebound or guarding. No fluid wave.  LAD: No inguinal or umbilical LAD Extremities:  Without edema. Neurologic:  Alert and  oriented x4;  grossly normal neurologically; no asterixis or clonus. Skin: No jaundice. No palmar erythema or spider angioma.  Psych:  Alert and cooperative. Normal mood and affect.     Jonan Seufert L. Tarri Glenn, MD, MPH 01/24/2022, 12:59 PM

## 2022-01-27 ENCOUNTER — Other Ambulatory Visit (INDEPENDENT_AMBULATORY_CARE_PROVIDER_SITE_OTHER): Payer: PPO

## 2022-01-27 DIAGNOSIS — R252 Cramp and spasm: Secondary | ICD-10-CM | POA: Diagnosis not present

## 2022-01-27 DIAGNOSIS — R748 Abnormal levels of other serum enzymes: Secondary | ICD-10-CM | POA: Diagnosis not present

## 2022-01-27 DIAGNOSIS — R932 Abnormal findings on diagnostic imaging of liver and biliary tract: Secondary | ICD-10-CM | POA: Diagnosis not present

## 2022-01-27 LAB — IBC + FERRITIN
Ferritin: 353.8 ng/mL — ABNORMAL HIGH (ref 10.0–291.0)
Iron: 168 ug/dL — ABNORMAL HIGH (ref 42–145)
Saturation Ratios: 65.6 % — ABNORMAL HIGH (ref 20.0–50.0)
TIBC: 256.2 ug/dL (ref 250.0–450.0)
Transferrin: 183 mg/dL — ABNORMAL LOW (ref 212.0–360.0)

## 2022-01-27 LAB — GLUCOSE, RANDOM: Glucose, Bld: 81 mg/dL (ref 70–99)

## 2022-01-31 ENCOUNTER — Other Ambulatory Visit: Payer: Self-pay

## 2022-01-31 ENCOUNTER — Telehealth: Payer: Self-pay | Admitting: Gastroenterology

## 2022-01-31 LAB — ANTI-SMOOTH MUSCLE ANTIBODY, IGG: Actin (Smooth Muscle) Antibody (IGG): <20 U (ref <20)

## 2022-01-31 LAB — ANA: Anti Nuclear Antibody (ANA): NEGATIVE

## 2022-01-31 LAB — IGG: IgG (Immunoglobin G), Serum: 882 mg/dL (ref 600–1540)

## 2022-01-31 LAB — HEPATITIS B CORE ANTIBODY, TOTAL: Hep B Core Total Ab: NONREACTIVE

## 2022-01-31 LAB — MITOCHONDRIAL ANTIBODIES: Mitochondrial M2 Ab, IgG: <=20 U (ref <=20.0)

## 2022-01-31 LAB — HEPATITIS B SURFACE ANTIGEN: Hepatitis B Surface Ag: NONREACTIVE

## 2022-01-31 LAB — HEPATITIS C ANTIBODY: Hepatitis C Ab: NONREACTIVE

## 2022-01-31 LAB — AFP TUMOR MARKER: AFP-Tumor Marker: 3.2 ng/mL

## 2022-01-31 LAB — HEPATITIS A ANTIBODY, TOTAL: Hepatitis A AB,Total: REACTIVE — AB

## 2022-01-31 LAB — HEPATITIS B SURFACE ANTIBODY,QUALITATIVE: Hep B S Ab: NONREACTIVE

## 2022-01-31 MED ORDER — DIAZEPAM 5 MG PO TABS: ORAL_TABLET | ORAL | 0 refills | Status: DC

## 2022-01-31 NOTE — Telephone Encounter (Signed)
Confirmed with pharmacy that fax was received & medication is ready for patient.

## 2022-01-31 NOTE — Telephone Encounter (Signed)
Spoke with patient regarding MD recommendations. Her husband plans to drive her to MRI & home. Prescription faxed to pharmacy. Unable to reach pharmacist by phone sent to voicemail.

## 2022-01-31 NOTE — Telephone Encounter (Signed)
Patient is calling states she is claustrophobic and is wondering if she can be prescribed something for her MRI tomorrow. Please advise

## 2022-02-01 ENCOUNTER — Ambulatory Visit (HOSPITAL_COMMUNITY)
Admission: RE | Admit: 2022-02-01 | Discharge: 2022-02-01 | Disposition: A | Discharge status: Home / Self Care | Payer: PPO | Source: Ambulatory Visit | Attending: Gastroenterology | Admitting: Gastroenterology

## 2022-02-01 DIAGNOSIS — R748 Abnormal levels of other serum enzymes: Secondary | ICD-10-CM | POA: Diagnosis not present

## 2022-02-01 DIAGNOSIS — R932 Abnormal findings on diagnostic imaging of liver and biliary tract: Secondary | ICD-10-CM | POA: Diagnosis not present

## 2022-02-01 DIAGNOSIS — K7689 Other specified diseases of liver: Secondary | ICD-10-CM | POA: Diagnosis not present

## 2022-02-01 MED ORDER — GADOBUTROL 1 MMOL/ML IV SOLN
6.0000 mL | Freq: Once | INTRAVENOUS | Status: AC | PRN
  Administered 2022-02-01: 6 mL via INTRAVENOUS

## 2022-02-02 LAB — INSULIN, FREE AND TOTAL
Free Insulin: 3.5 uU/mL
Total Insulin: 3.5 uU/mL

## 2022-02-03 ENCOUNTER — Ambulatory Visit (HOSPITAL_COMMUNITY)
Admission: RE | Admit: 2022-02-03 | Discharge: 2022-02-03 | Disposition: A | Discharge status: Home / Self Care | Payer: PPO | Source: Ambulatory Visit | Attending: Gastroenterology | Admitting: Gastroenterology

## 2022-02-03 DIAGNOSIS — R748 Abnormal levels of other serum enzymes: Secondary | ICD-10-CM

## 2022-02-03 DIAGNOSIS — R932 Abnormal findings on diagnostic imaging of liver and biliary tract: Secondary | ICD-10-CM | POA: Diagnosis not present

## 2022-02-03 DIAGNOSIS — K7689 Other specified diseases of liver: Secondary | ICD-10-CM | POA: Diagnosis not present

## 2022-02-07 ENCOUNTER — Telehealth: Payer: Self-pay | Admitting: Gastroenterology

## 2022-02-07 NOTE — Telephone Encounter (Signed)
Patient is urgently seeking MRI results

## 2022-02-08 ENCOUNTER — Other Ambulatory Visit: Payer: Self-pay

## 2022-02-08 ENCOUNTER — Encounter: Payer: Self-pay | Admitting: Gastroenterology

## 2022-02-08 DIAGNOSIS — R935 Abnormal findings on diagnostic imaging of other abdominal regions, including retroperitoneum: Secondary | ICD-10-CM

## 2022-02-23 ENCOUNTER — Other Ambulatory Visit: Payer: Self-pay

## 2022-02-23 ENCOUNTER — Emergency Department (HOSPITAL_BASED_OUTPATIENT_CLINIC_OR_DEPARTMENT_OTHER): Payer: PPO

## 2022-02-23 ENCOUNTER — Encounter (HOSPITAL_BASED_OUTPATIENT_CLINIC_OR_DEPARTMENT_OTHER): Payer: Self-pay | Admitting: Emergency Medicine

## 2022-02-23 ENCOUNTER — Emergency Department (HOSPITAL_BASED_OUTPATIENT_CLINIC_OR_DEPARTMENT_OTHER)
Admission: EM | Admit: 2022-02-23 | Discharge: 2022-02-23 | Disposition: A | Discharge status: Home / Self Care | Payer: PPO | Attending: Emergency Medicine | Admitting: Emergency Medicine

## 2022-02-23 ENCOUNTER — Telehealth: Payer: Self-pay | Admitting: Internal Medicine

## 2022-02-23 DIAGNOSIS — R197 Diarrhea, unspecified: Secondary | ICD-10-CM | POA: Diagnosis not present

## 2022-02-23 DIAGNOSIS — R1032 Left lower quadrant pain: Secondary | ICD-10-CM | POA: Diagnosis not present

## 2022-02-23 DIAGNOSIS — K8020 Calculus of gallbladder without cholecystitis without obstruction: Secondary | ICD-10-CM | POA: Insufficient documentation

## 2022-02-23 DIAGNOSIS — R748 Abnormal levels of other serum enzymes: Secondary | ICD-10-CM | POA: Insufficient documentation

## 2022-02-23 DIAGNOSIS — I700 Atherosclerosis of aorta: Secondary | ICD-10-CM | POA: Diagnosis not present

## 2022-02-23 DIAGNOSIS — Z20822 Contact with and (suspected) exposure to covid-19: Secondary | ICD-10-CM | POA: Diagnosis not present

## 2022-02-23 DIAGNOSIS — Z1152 Encounter for screening for COVID-19: Secondary | ICD-10-CM | POA: Insufficient documentation

## 2022-02-23 DIAGNOSIS — I10 Essential (primary) hypertension: Secondary | ICD-10-CM | POA: Diagnosis not present

## 2022-02-23 DIAGNOSIS — Z79899 Other long term (current) drug therapy: Secondary | ICD-10-CM | POA: Insufficient documentation

## 2022-02-23 DIAGNOSIS — R112 Nausea with vomiting, unspecified: Secondary | ICD-10-CM | POA: Diagnosis not present

## 2022-02-23 DIAGNOSIS — E871 Hypo-osmolality and hyponatremia: Secondary | ICD-10-CM | POA: Diagnosis not present

## 2022-02-23 DIAGNOSIS — R110 Nausea: Secondary | ICD-10-CM

## 2022-02-23 LAB — LIPASE, BLOOD: Lipase: 15 U/L (ref 11–51)

## 2022-02-23 LAB — COMPREHENSIVE METABOLIC PANEL
ALT: 111 U/L — ABNORMAL HIGH (ref 0–44)
AST: 114 U/L — ABNORMAL HIGH (ref 15–41)
Albumin: 4.1 g/dL (ref 3.5–5.0)
Alkaline Phosphatase: 80 U/L (ref 38–126)
Anion gap: 10 (ref 5–15)
BUN: 5 mg/dL — ABNORMAL LOW (ref 8–23)
CO2: 27 mmol/L (ref 22–32)
Calcium: 9 mg/dL (ref 8.9–10.3)
Chloride: 95 mmol/L — ABNORMAL LOW (ref 98–111)
Creatinine, Ser: 0.82 mg/dL (ref 0.44–1.00)
GFR, Estimated: >60 mL/min (ref >60)
Glucose, Bld: 101 mg/dL — ABNORMAL HIGH (ref 70–99)
Potassium: 3.9 mmol/L (ref 3.5–5.1)
Sodium: 132 mmol/L — ABNORMAL LOW (ref 135–145)
Total Bilirubin: 0.6 mg/dL (ref 0.3–1.2)
Total Protein: 6.5 g/dL (ref 6.5–8.1)

## 2022-02-23 LAB — CBC
HCT: 39.8 % (ref 36.0–46.0)
Hemoglobin: 14.2 g/dL (ref 12.0–15.0)
MCH: 34.6 pg — ABNORMAL HIGH (ref 26.0–34.0)
MCHC: 35.7 g/dL (ref 30.0–36.0)
MCV: 97.1 fL (ref 80.0–100.0)
Platelets: 155 10*3/uL (ref 150–400)
RBC: 4.1 MIL/uL (ref 3.87–5.11)
RDW: 12.7 % (ref 11.5–15.5)
WBC: 4.2 10*3/uL (ref 4.0–10.5)
nRBC: 0 % (ref 0.0–0.2)

## 2022-02-23 LAB — RESP PANEL BY RT-PCR (RSV, FLU A&B, COVID)  RVPGX2
Influenza A by PCR: NEGATIVE
Influenza B by PCR: NEGATIVE
Resp Syncytial Virus by PCR: NEGATIVE
SARS Coronavirus 2 by RT PCR: NEGATIVE

## 2022-02-23 MED ORDER — IOHEXOL 300 MG/ML  SOLN
100.0000 mL | Freq: Once | INTRAMUSCULAR | Status: AC | PRN
  Administered 2022-02-23: 80 mL via INTRAVENOUS

## 2022-02-23 MED ORDER — ONDANSETRON 4 MG PO TBDP: 4.0000 mg | ORAL_TABLET | Freq: Three times a day (TID) | ORAL | 0 refills | Status: DC | PRN

## 2022-02-23 MED ORDER — SODIUM CHLORIDE 0.9 % IV BOLUS
1000.0000 mL | Freq: Once | INTRAVENOUS | Status: AC
  Administered 2022-02-23: 1000 mL via INTRAVENOUS

## 2022-02-23 NOTE — Telephone Encounter (Addendum)
Called patient and let her know that Dr Renold Genta wants her to go to emergency room, she verbalized understanding

## 2022-02-23 NOTE — Telephone Encounter (Signed)
Alexandra Taylor [phone]  [name] called to say she has Headache,diarrhea, throwing up and chills for 1 week, she done a COVID test and it was negative and she also said she done a flu test at [facility] that was negative. No cough, or runny nose. What type of visit would you want to do, video?

## 2022-02-23 NOTE — Discharge Instructions (Addendum)
It was a pleasure taking care of you today.  As discussed, all of your labs are reassuring.  Your liver enzymes were elevated.  Please have your liver enzymes rechecked in 1 week.  I suspect you have a viral infection causing her symptoms.  Continue to drink extra fluids.  Your COVID and flu test were negative.  Please follow-up with PCP for further evaluation.  Return to the ER for new or worsening symptoms.

## 2022-02-23 NOTE — ED Triage Notes (Signed)
Pt arrives to ED with c/o diarrhea, vomiting, headache, chills x1 week.

## 2022-02-23 NOTE — ED Provider Notes (Signed)
[place] EMERGENCY DEPT Provider Note   CSN: [id number] Arrival date & time: 02/23/22  1336     History  Chief Complaint  Patient presents with   Diarrhea   Emesis    Alexandra Taylor is a 71 y.o. female with a past medical history significant for hypertension, hyperlipidemia, anxiety, depression, and ADHD who presents to the ED due to diarrhea, nausea, headache, and chills x 1 week.  Patient admits to numerous episodes of nonbloody diarrhea.  Last had diarrhea earlier this morning.  Admits to abdominal pain around her umbilicus which has slightly improved.  Admits to nausea however, no vomiting.  Patient took an at home COVID test which was negative.  Patient was advised by PCP to report to the ED for labs and possible IV fluids.  Patient is currently following GI for liver issues and notes she needs to see a liver specialist. Denies recent antibiotics. No fever.   History obtained from patient and past medical records. No interpreter used during encounter.       Home Medications Prior to Admission medications   Medication Sig Start Date End Date Taking? Authorizing Provider  ondansetron (ZOFRAN-ODT) 4 MG disintegrating tablet Take 1 tablet (4 mg total) by mouth every 8 (eight) hours as needed for nausea or vomiting. 02/23/22  Yes Arietta Eisenstein C, PA-C  ASHWAGANDHA PO Take by mouth.    [provider]  b complex vitamins tablet Take 1 tablet by mouth daily.    [provider]  BIOTIN 5000 PO Take by mouth.    [provider]  buPROPion (WELLBUTRIN XL) 150 MG 24 hr tablet TAKE 1 TABLET BY MOUTH EVERY DAY 12/02/21   Elby Showers, MD  diazepam (VALIUM) 5 MG tablet Take 1 tablet (5 mg total) by mouth once for 1 dose 20 minutes prior to MRI. 01/31/22   Thornton Park, MD  fish oil-omega-3 fatty acids 1000 MG capsule Take 2 g by mouth daily.    [provider]  FLUoxetine (PROZAC) 20 MG capsule TAKE 2 CAPSULES BY MOUTH EVERY  DAY 08/11/21   Elby Showers, MD  fluticasone (FLONASE) 50 MCG/ACT nasal spray Place 1 spray into both nostrils as needed. 10/15/20   Elby Showers, MD  GLUCOSAMINE SULFATE PO Take by mouth daily.    [provider]  levothyroxine (SYNTHROID) 50 MCG tablet TAKE 1 TABLET BY MOUTH EVERY DAY BEFORE BREAKFAST 12/01/21   Elby Showers, MD  meclizine (ANTIVERT) 25 MG tablet Take 1 tablet (25 mg total) by mouth 3 (three) times daily as needed for dizziness. Patient not taking: Reported on 01/24/2022 05/02/21   Elby Showers, MD  ramipril (ALTACE) 10 MG capsule TAKE 1 CAPSULE BY MOUTH EVERY DAY 09/27/21   Elby Showers, MD      Allergies    Patient has no known allergies.    Review of Systems   Review of Systems  Constitutional:  Positive for chills. Negative for fever.  Respiratory:  Negative for shortness of breath.   Cardiovascular:  Negative for chest pain.  Gastrointestinal:  Positive for abdominal pain, diarrhea and nausea. Negative for vomiting.  Genitourinary:  Negative for dysuria.    Physical Exam Updated Vital Signs BP 129/73   Pulse 71   Temp 98.2 F (36.8 C) (Oral)   Resp 15   Ht '5\' 3"'$  (1.6 m)   Wt 63.5 kg   SpO2 99%   BMI 24.80 kg/m  Physical Exam Vitals and nursing  note reviewed.  Constitutional:      General: She is not in acute distress.    Appearance: She is not ill-appearing.  HENT:     Head: Normocephalic.  Eyes:     Pupils: Pupils are equal, round, and reactive to light.  Cardiovascular:     Rate and Rhythm: Normal rate and regular rhythm.     Pulses: Normal pulses.     Heart sounds: Normal heart sounds. No murmur heard.    No friction rub. No gallop.  Pulmonary:     Effort: Pulmonary effort is normal.     Breath sounds: Normal breath sounds.  Abdominal:     General: Abdomen is flat. There is no distension.     Palpations: Abdomen is soft.     Tenderness: There is no abdominal tenderness. There is no guarding or rebound.     Comments: Abdomen  soft, nondistended, nontender to palpation in all quadrants without guarding or peritoneal signs. No rebound.   Musculoskeletal:        General: Normal range of motion.     Cervical back: Neck supple.  Skin:    General: Skin is warm and dry.  Neurological:     General: No focal deficit present.     Mental Status: She is alert.  Psychiatric:        Mood and Affect: Mood normal.        Behavior: Behavior normal.     ED Results / Procedures / Treatments   Labs (all labs ordered are listed, but only abnormal results are displayed) Labs Reviewed  COMPREHENSIVE METABOLIC PANEL - Abnormal; Notable for the following components:      Result Value   Sodium 132 (*)    Chloride 95 (*)    Glucose, Bld 101 (*)    BUN 5 (*)    AST 114 (*)    ALT 111 (*)    All other components within normal limits  CBC - Abnormal; Notable for the following components:   MCH 34.6 (*)    All other components within normal limits  RESP PANEL BY RT-PCR (RSV, FLU A&B, COVID)  RVPGX2  LIPASE, BLOOD    EKG EKG Interpretation  Date/Time:  Thursday February 23 2022 17:51:45 EST Ventricular Rate:  66 PR Interval:  166 QRS Duration: 104 QT Interval:  436 QTC Calculation: 457 R Axis:   -33 Text Interpretation: Sinus rhythm Left axis deviation Anterior infarct, old No significant change since last tracing Confirmed by Deno Etienne [id number]) on 02/23/2022 7:22:32 PM  Radiology CT ABDOMEN PELVIS W CONTRAST  Result Date: 02/23/2022 CLINICAL DATA:  Left lower quadrant abdominal pain EXAM: CT ABDOMEN AND PELVIS WITH CONTRAST TECHNIQUE: Multidetector CT imaging of the abdomen and pelvis was performed using the standard protocol following bolus administration of intravenous contrast. RADIATION DOSE REDUCTION: This exam was performed according to the departmental dose-optimization program which includes automated exposure control, adjustment of the mA and/or kV according to patient size and/or use of iterative  reconstruction technique. CONTRAST:  58m OMNIPAQUE IOHEXOL 300 MG/ML  SOLN COMPARISON:  Ultrasound abdomen 02/03/2022 and MRI abdomen 02/01/2022 FINDINGS: Lower chest: No acute abnormality. Hepatobiliary: Cholelithiasis. No biliary dilation. No evidence of cholecystitis. No focal hepatic lesion. Pancreas: Unremarkable. No pancreatic ductal dilatation or surrounding inflammatory changes. Spleen: Normal in size without focal abnormality. Adrenals/Urinary Tract: Unremarkable adrenal glands. Prominent bilateral extrarenal pelvises. No obstructing calculi. Bladder is not distended. Stomach/Bowel: Colonic diverticulosis without diverticulitis. Normal caliber large and small bowel. Normal appendix.  Unremarkable stomach. Vascular/Lymphatic: Prominent subcentimeter left mesenteric nodes measuring up to 6 mm and are likely reactive. Mild aortic atherosclerosis. Reproductive: Unremarkable. Other: Mild mesenteric edema. Trace free fluid in the pelvis. No free intraperitoneal air. Musculoskeletal: No acute abnormality. IMPRESSION: No acute abnormality in the abdomen or pelvis. Cholelithiasis. Colonic diverticulosis without diverticulitis. Electronically Signed   By: Placido Sou M.D.   On: 02/23/2022 20:14    Procedures Procedures    Medications Ordered in ED Medications  sodium chloride 0.9 % bolus 1,000 mL (1,000 mLs Intravenous New Bag/Given 02/23/22 1808)  iohexol (OMNIPAQUE) 300 MG/ML solution 100 mL (80 mLs Intravenous Contrast Given 02/23/22 1943)    ED Course/ Medical Decision Making/ A&P                           Medical Decision Making Amount and/or Complexity of Data Reviewed External Data Reviewed: notes. Labs: ordered. Decision-making details documented in ED Course. Radiology: ordered and independent interpretation performed. Decision-making details documented in ED Course. ECG/medicine tests: ordered and independent interpretation performed. Decision-making details documented in ED  Course.  Risk Prescription drug management.   This patient presents to the ED for concern of nausea/diarrhea, this involves an extensive number of treatment options, and is a complaint that carries with it a high risk of complications and morbidity.  The differential diagnosis includes gastroenteritis, diverticulitis, acute cholecystitis, etc  71 year old female presents to the ED due to nausea, diarrhea, headache, and chills x 1 week.  Patient is currently being followed by GI due to abnormal liver enzymes and is being referred to hematology per patient.  Upon arrival, patient afebrile, not tachycardic or hypoxic.  Patient in no acute distress.  Reassuring physical exam.  Abdomen soft, nondistended, nontender.  Routine labs ordered at triage.  Will give IV fluids. Suspect viral etiology.   CBC reassuring.  No leukocytosis.  Normal hemoglobin.  Lipase normal at 15.  Doubt pancreatitis.  Transaminitis with AST at 114 and ALT at 111.  Patient is currently being followed by GI.  LFTs appear slightly higher than they previous were.  No right upper quadrant tenderness to suggest acute cholecystitis.  Mild hyponatremia 132.  Normal renal function.  COVID/influenza negative. CT abdomen ordered.  8:39 PM reassessed at bedside.  Patient admits to resolution in symptoms and is ready to go home.  CT abdomen personally reviewed and interpreted which is negative for any acute abnormalities.  Does show cholelithiasis.  No evidence of acute cholecystitis.  Nontender right upper quadrant.  Demonstrates diverticulosis without evidence of diverticulitis.  Advised patient to have LFTs rechecked in 1 week by PCP or GI doctor.  Suspect viral etiology of symptoms.  Patient stable for discharge. Strict ED precautions discussed with patient. Patient states understanding and agrees to plan. Patient discharged home in no acute distress and stable vitals.  Discussed with Dr. Tyrone Nine who agrees with assessment and plan.         Final Clinical Impression(s) / ED Diagnoses Final diagnoses:  Diarrhea, unspecified type  Nausea  Gallstones  Elevated liver enzymes    Rx / DC Orders ED Discharge Orders          Ordered    ondansetron (ZOFRAN-ODT) 4 MG disintegrating tablet  Every 8 hours PRN        02/23/22 2041              Suzy Bouchard, PA-C 02/23/22 2101    Deno Etienne, DO 02/23/22  2300  

## 2022-02-24 NOTE — Telephone Encounter (Signed)
LVM to CB, I was calling to see how she was feeling since she had gone to emergency room and to also  Ask pt to contact Dr. Tarri Glenn to see if she wants to check LFTs or for [place] to do that

## 2022-02-24 NOTE — Telephone Encounter (Signed)
[name] called back and she is going to call Dr Tarri Glenn office about checking LFT's, and if no luck she will call me back on Wednesday morning and come in on Thursday or Friday next week and have them rechecked.

## 2022-02-28 ENCOUNTER — Telehealth: Payer: Self-pay | Admitting: Gastroenterology

## 2022-02-28 ENCOUNTER — Other Ambulatory Visit: Payer: Self-pay

## 2022-02-28 DIAGNOSIS — R748 Abnormal levels of other serum enzymes: Secondary | ICD-10-CM

## 2022-02-28 NOTE — Telephone Encounter (Signed)
Labs ordered. Patient is aware & has been advised on when/where to go. She verbalized all understanding.

## 2022-02-28 NOTE — Telephone Encounter (Signed)
Patient called states she was at ED and she had elevated LFT's they advised her to get them checked again within a week. She is asking if orders can be placed for her to go get it done.

## 2022-03-03 ENCOUNTER — Other Ambulatory Visit: Payer: Self-pay

## 2022-03-03 ENCOUNTER — Other Ambulatory Visit (INDEPENDENT_AMBULATORY_CARE_PROVIDER_SITE_OTHER): Payer: PPO

## 2022-03-03 DIAGNOSIS — R748 Abnormal levels of other serum enzymes: Secondary | ICD-10-CM

## 2022-03-03 LAB — HEPATIC FUNCTION PANEL
ALT: 57 U/L — ABNORMAL HIGH (ref 0–35)
AST: 47 U/L — ABNORMAL HIGH (ref 0–37)
Albumin: 3.8 g/dL (ref 3.5–5.2)
Alkaline Phosphatase: 127 U/L — ABNORMAL HIGH (ref 39–117)
Bilirubin, Direct: 0.1 mg/dL (ref 0.0–0.3)
Total Bilirubin: 0.6 mg/dL (ref 0.2–1.2)
Total Protein: 6 g/dL (ref 6.0–8.3)

## 2022-03-10 ENCOUNTER — Other Ambulatory Visit (INDEPENDENT_AMBULATORY_CARE_PROVIDER_SITE_OTHER): Payer: PPO

## 2022-03-10 DIAGNOSIS — R748 Abnormal levels of other serum enzymes: Secondary | ICD-10-CM | POA: Diagnosis not present

## 2022-03-10 LAB — HEPATIC FUNCTION PANEL
ALT: 44 U/L — ABNORMAL HIGH (ref 0–35)
AST: 40 U/L — ABNORMAL HIGH (ref 0–37)
Albumin: 4 g/dL (ref 3.5–5.2)
Alkaline Phosphatase: 106 U/L (ref 39–117)
Bilirubin, Direct: 0.2 mg/dL (ref 0.0–0.3)
Total Bilirubin: 0.8 mg/dL (ref 0.2–1.2)
Total Protein: 6.3 g/dL (ref 6.0–8.3)

## 2022-03-15 ENCOUNTER — Inpatient Hospital Stay: Payer: PPO | Attending: Hematology and Oncology

## 2022-03-15 ENCOUNTER — Inpatient Hospital Stay (HOSPITAL_BASED_OUTPATIENT_CLINIC_OR_DEPARTMENT_OTHER): Payer: PPO | Admitting: Hematology and Oncology

## 2022-03-15 VITALS — BP 146/80 | HR 61 | Temp 98.0°F | Resp 16 | Ht 63.0 in | Wt 134.2 lb

## 2022-03-15 DIAGNOSIS — E079 Disorder of thyroid, unspecified: Secondary | ICD-10-CM | POA: Insufficient documentation

## 2022-03-15 DIAGNOSIS — Z800 Family history of malignant neoplasm of digestive organs: Secondary | ICD-10-CM | POA: Diagnosis not present

## 2022-03-15 DIAGNOSIS — R7989 Other specified abnormal findings of blood chemistry: Secondary | ICD-10-CM | POA: Diagnosis not present

## 2022-03-15 DIAGNOSIS — Z87891 Personal history of nicotine dependence: Secondary | ICD-10-CM | POA: Diagnosis not present

## 2022-03-15 DIAGNOSIS — E785 Hyperlipidemia, unspecified: Secondary | ICD-10-CM

## 2022-03-15 DIAGNOSIS — I10 Essential (primary) hypertension: Secondary | ICD-10-CM | POA: Insufficient documentation

## 2022-03-15 DIAGNOSIS — M8580 Other specified disorders of bone density and structure, unspecified site: Secondary | ICD-10-CM

## 2022-03-15 LAB — RETIC PANEL
Immature Retic Fract: 5.6 % (ref 2.3–15.9)
RBC.: 4.07 MIL/uL (ref 3.87–5.11)
Retic Count, Absolute: 30.5 10*3/uL (ref 19.0–186.0)
Retic Ct Pct: 0.8 % (ref 0.4–3.1)
Reticulocyte Hemoglobin: 35.8 pg (ref >27.9)

## 2022-03-15 LAB — IRON AND IRON BINDING CAPACITY (CC-WL,HP ONLY)
Iron: 96 ug/dL (ref 28–170)
Saturation Ratios: 32 % — ABNORMAL HIGH (ref 10.4–31.8)
TIBC: 298 ug/dL (ref 250–450)
UIBC: 202 ug/dL

## 2022-03-15 LAB — CBC WITH DIFFERENTIAL (CANCER CENTER ONLY)
Abs Immature Granulocytes: 0 10*3/uL (ref 0.00–0.07)
Basophils Absolute: 0.1 10*3/uL (ref 0.0–0.1)
Basophils Relative: 1 %
Eosinophils Absolute: 0.2 10*3/uL (ref 0.0–0.5)
Eosinophils Relative: 5 %
HCT: 39.9 % (ref 36.0–46.0)
Hemoglobin: 13.8 g/dL (ref 12.0–15.0)
Immature Granulocytes: 0 %
Lymphocytes Relative: 30 %
Lymphs Abs: 1.2 10*3/uL (ref 0.7–4.0)
MCH: 33.8 pg (ref 26.0–34.0)
MCHC: 34.6 g/dL (ref 30.0–36.0)
MCV: 97.8 fL (ref 80.0–100.0)
Monocytes Absolute: 0.4 10*3/uL (ref 0.1–1.0)
Monocytes Relative: 11 %
Neutro Abs: 2.1 10*3/uL (ref 1.7–7.7)
Neutrophils Relative %: 53 %
Platelet Count: 197 10*3/uL (ref 150–400)
RBC: 4.08 MIL/uL (ref 3.87–5.11)
RDW: 12.4 % (ref 11.5–15.5)
WBC Count: 4 10*3/uL (ref 4.0–10.5)
nRBC: 0 % (ref 0.0–0.2)

## 2022-03-15 LAB — CMP (CANCER CENTER ONLY)
ALT: 40 U/L (ref 0–44)
AST: 40 U/L (ref 15–41)
Albumin: 3.9 g/dL (ref 3.5–5.0)
Alkaline Phosphatase: 109 U/L (ref 38–126)
Anion gap: 6 (ref 5–15)
BUN: 12 mg/dL (ref 8–23)
CO2: 25 mmol/L (ref 22–32)
Calcium: 9.1 mg/dL (ref 8.9–10.3)
Chloride: 105 mmol/L (ref 98–111)
Creatinine: 0.91 mg/dL (ref 0.44–1.00)
GFR, Estimated: >60 mL/min (ref >60)
Glucose, Bld: 93 mg/dL (ref 70–99)
Potassium: 4.5 mmol/L (ref 3.5–5.1)
Sodium: 136 mmol/L (ref 135–145)
Total Bilirubin: 0.6 mg/dL (ref 0.3–1.2)
Total Protein: 6.7 g/dL (ref 6.5–8.1)

## 2022-03-15 LAB — C-REACTIVE PROTEIN: CRP: 0.5 mg/dL (ref <1.0)

## 2022-03-15 LAB — SEDIMENTATION RATE: Sed Rate: 3 mm/hr (ref 0–22)

## 2022-03-15 LAB — FERRITIN: Ferritin: 290 ng/mL (ref 11–307)

## 2022-03-15 NOTE — Progress Notes (Signed)
[facility] Telephone:([phone]) [phone]   Fax:([phone]) [place] NOTE  Patient Care Team: Elby Showers, MD as PCP - General (Internal Medicine)  Hematological/Oncological History # Concern for Hemochromatosis  01/27/2022: Ferritin 353.8, Iron 168 02/01/2022: MR abdomen showed heterogeneous loss of signal within the liver on the inphase sequences. Imaging findings are compatible with iron deposition disease within the liver 03/15/2022: establish care with Dr. Lorenso Courier   CHIEF COMPLAINTS/PURPOSE OF CONSULTATION:  "Concern for Hemochromatosis  "  HISTORY OF PRESENTING ILLNESS:  Alexandra Taylor 72 y.o. female with medical history significant for HLD, HTN, and osteopenia who presents for evaluation of possible hemochromatosis.   On review of the previous records Alexandra Taylor underwent an MRI on 02/01/2022 which showed heterogeneous loss of signal within the liver on the inphase sequences. Imaging findings are compatible with iron deposition disease within the liver.  Previously labs drawn on 01/27/2022 showed Ferritin 353.8, Iron 168.  Due to concern for these findings the patient was referred to hematology for further evaluation and management.  On exam today Alexandra Taylor reports she is here today due to the MRI findings and her high ferritin content.  She reports that she has not had trouble with iron levels in the past.  She reports her mother had low iron levels and had her eat liver as a child.  She notes that there is no liver disease in her family though her brother did have colon cancer.  She has thyroid disease but no one else has thyroid disease or type 2 diabetes.  Her father had Alzheimer's.  She has 7 brothers and 3 sisters.  She notes that she is of [place] descent.  On further discussion she reports that she is a former smoker having quit in 1986.  She drank a lot of alcohol during the pandemic approximate 6 losses of wine per day but she has stopped  this.  She reports that she is participating in "drive January".  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  She notes that she did have a GI illness in December which subsequently resolved.  She notes that she does have some psoriasis and osteoarthritis.  She otherwise denies any fevers, chills, sweats, nausea, vomiting or diarrhea.  A full 10 point ROS was otherwise negative.  MEDICAL HISTORY:  Past Medical History:  Diagnosis Date   Hyperlipidemia    Hypertension    Osteopenia     SURGICAL HISTORY: Past Surgical History:  Procedure Laterality Date   hemithyroidectomy      SOCIAL HISTORY: Social History   Socioeconomic History   Marital status: Married    Spouse name: Not on file   Number of children: Not on file   Years of education: Not on file   Highest education level: Not on file  Occupational History   Not on file  Tobacco Use   Smoking status: Former    Packs/day: 1.00    Years: 15.00    Total pack years: 15.00    Types: Cigarettes    Quit date: 02/28/1984    Years since quitting: 38.0   Smokeless tobacco: Never  Substance and Sexual Activity   Alcohol use: Yes    Comment: Daily   Drug use: No   Sexual activity: Yes  Other Topics Concern   Not on file  Social History Narrative   Not on file   Social Determinants of Health   Financial Resource Strain: Not on file  Food Insecurity: Not on file  Transportation Needs: Not on file  Physical Activity: Not on file  Stress: Not on file  Social Connections: Not on file  Intimate Partner Violence: Not on file    FAMILY HISTORY: Family History  Problem Relation Age of Onset   Hypertension Mother    Colon cancer Brother     ALLERGIES:  has No Known Allergies.  MEDICATIONS:  Current Outpatient Medications  Medication Sig Dispense Refill   ASHWAGANDHA PO Take by mouth.     b complex vitamins tablet Take 1 tablet by mouth daily.     BIOTIN 5000 PO Take by mouth.      buPROPion (WELLBUTRIN XL) 150 MG 24 hr tablet TAKE 1 TABLET BY MOUTH EVERY DAY 90 tablet 0   diazepam (VALIUM) 5 MG tablet Take 1 tablet (5 mg total) by mouth once for 1 dose 20 minutes prior to MRI. 1 tablet 0   fish oil-omega-3 fatty acids 1000 MG capsule Take 2 g by mouth daily.     FLUoxetine (PROZAC) 20 MG capsule TAKE 2 CAPSULES BY MOUTH EVERY DAY 180 capsule 2   fluticasone (FLONASE) 50 MCG/ACT nasal spray Place 1 spray into both nostrils as needed. 1 g 5   GLUCOSAMINE SULFATE PO Take by mouth daily.     levothyroxine (SYNTHROID) 50 MCG tablet TAKE 1 TABLET BY MOUTH EVERY DAY BEFORE BREAKFAST 90 tablet 1   meclizine (ANTIVERT) 25 MG tablet Take 1 tablet (25 mg total) by mouth 3 (three) times daily as needed for dizziness. (Patient not taking: Reported on 01/24/2022) 30 tablet 0   ondansetron (ZOFRAN-ODT) 4 MG disintegrating tablet Take 1 tablet (4 mg total) by mouth every 8 (eight) hours as needed for nausea or vomiting. 12 tablet 0   ramipril (ALTACE) 10 MG capsule TAKE 1 CAPSULE BY MOUTH EVERY DAY 90 capsule 1   No current facility-administered medications for this visit.    REVIEW OF SYSTEMS:   Constitutional: ( - ) fevers, ( - )  chills , ( - ) night sweats Eyes: ( - ) blurriness of vision, ( - ) double vision, ( - ) watery eyes Ears, nose, mouth, throat, and face: ( - ) mucositis, ( - ) sore throat Respiratory: ( - ) cough, ( - ) dyspnea, ( - ) wheezes Cardiovascular: ( - ) palpitation, ( - ) chest discomfort, ( - ) lower extremity swelling Gastrointestinal:  ( - ) nausea, ( - ) heartburn, ( - ) change in bowel habits Skin: ( - ) abnormal skin rashes Lymphatics: ( - ) new lymphadenopathy, ( - ) easy bruising Neurological: ( - ) numbness, ( - ) tingling, ( - ) new weaknesses Behavioral/Psych: ( - ) mood change, ( - ) new changes  All other systems were reviewed with the patient and are negative.  PHYSICAL EXAMINATION:  Vitals:   03/15/22 1342  BP: (!) 146/80  Pulse: 61   Resp: 16  Temp: 98 F (36.7 C)  SpO2: 100%   Filed Weights   03/15/22 1342  Weight: 134 lb 3.2 oz (60.9 kg)    GENERAL: well appearing elderly Caucasian female in NAD  SKIN: skin color, texture, turgor are normal, no rashes or significant lesions EYES: conjunctiva are pink and non-injected, sclera clear LUNGS: clear to auscultation and percussion with normal breathing effort HEART: regular rate & rhythm and no murmurs and no lower extremity edema Musculoskeletal: no cyanosis of digits and no clubbing  PSYCH: alert & oriented  x 3, fluent speech NEURO: no focal motor/sensory deficits  LABORATORY DATA:  I have reviewed the data as listed    Latest Ref Rng & Units 02/23/2022    2:00 PM 10/11/2021    9:18 AM 05/02/2021    4:42 PM  CBC  WBC 4.0 - 10.5 K/uL 4.2  3.3  4.9   Hemoglobin 12.0 - 15.0 g/dL 14.2  13.9  14.2   Hematocrit 36.0 - 46.0 % 39.8  40.0  41.8   Platelets 150 - 400 K/uL 155  177  196        Latest Ref Rng & Units 03/10/2022    1:32 PM 03/03/2022   10:14 AM 02/23/2022    2:00 PM  CMP  Glucose 70 - 99 mg/dL   101   BUN 8 - 23 mg/dL   5   Creatinine 0.44 - 1.00 mg/dL   0.82   Sodium 135 - 145 mmol/L   132   Potassium 3.5 - 5.1 mmol/L   3.9   Chloride 98 - 111 mmol/L   95   CO2 22 - 32 mmol/L   27   Calcium 8.9 - 10.3 mg/dL   9.0   Total Protein 6.0 - 8.3 g/dL 6.3  6.0  6.5   Total Bilirubin 0.2 - 1.2 mg/dL 0.8  0.6  0.6   Alkaline Phos 39 - 117 U/L 106  127  80   AST 0 - 37 U/L 40  47  114   ALT 0 - 35 U/L 44  57  111      ASSESSMENT & PLAN Alexandra Taylor 72 y.o. female with medical history significant for HLD, HTN, and osteopenia who presents for evaluation of possible hemochromatosis.   After review of the labs, review of the records, and discussion with the patient the patients findings are most consistent with elevated ferritin and concerning findings on MRI liver.   Elevated serum ferritin levels have numerous possible etiologies. These  include hereditary hemochromatosis (heterozygous or homozygous), inflammation, liver disease, or iron overload from an exogenous source. Hereditary hemochromatosis is a hereditary condition caused by mutations in the HFE gene, which regulates iron absorption. The most common genes mutated in this condition are the C282Y and H63D genes. Homozygous mutations represent a disease state which requires phlebotomy to decrease ferritin levels to a goal of <50  (Blood?(2010) 116 (3): 317-325). The goal is to decrease ferritin so there is no deposition in critical organs (liver, heart, pancreas and thyroid). Heterozygous mutations (or compound heterozygotes) rarely require phlebotomy, but do have elevated serum iron/ferritin levels.  Ferritin is an acute phase reactant and can be elevated with systemic inflammation. Direct damage to liver tissue can also cause spillage of ferritin into the blood, resulting in elevated ferritin.  Additionally, serum iron levels can be quite transient and an elevation or serum iron may not represent a true overload of total body iron (best lab for this is ferritin).    #Elevated Iron/Ferritin  # Concern for Hemochromatosis Based on Liver MRI --labs to include CBC, CMP, LDH, ESR, CRP  --will repeat iron panel and ferritin today  --will send for HFE gene mutation. If found to have homozygous mutation for HFE will begin phlebotomies every other week with goal ferritin <50   --if patient confirmed to have hereditary hemochromatosis will order TSH, TTE, and Hepatitis B/C panels.   --RTC pending results of above studies.    No orders of the defined types were placed in  this encounter.   All questions were answered. The patient knows to call the clinic with any problems, questions or concerns.  A total of more than 60 minutes were spent on this encounter with face-to-face time and non-face-to-face time, including preparing to see the patient, ordering tests and/or medications,  counseling the patient and coordination of care as outlined above.   Ledell Peoples, MD Department of Hematology/Oncology [place] [facility] Phone: [phone] Pager: [phone] Email: [email or web address]  03/15/2022 2:01 PM

## 2022-03-16 ENCOUNTER — Ambulatory Visit (INDEPENDENT_AMBULATORY_CARE_PROVIDER_SITE_OTHER): Payer: PPO

## 2022-03-16 VITALS — HR 98 | Temp 99.7°F

## 2022-03-16 DIAGNOSIS — Z7185 Encounter for immunization safety counseling: Secondary | ICD-10-CM | POA: Diagnosis not present

## 2022-03-16 DIAGNOSIS — Z23 Encounter for immunization: Secondary | ICD-10-CM

## 2022-03-19 NOTE — Progress Notes (Signed)
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 Secondary hemochromatosis    - ferritin <500, iron levels normal    - seeing Dr. Lorenso Courier Abnormal ultrasound with initial concerns for cirrhosis    - APRI 0.743 (may suggest advanced fibrosis)    - FIB4 3.12 (not diagnostic of cirrhosis or no fibrosis)    - MRI shows nodularity likely due to areas of iron deposition sparing not fibrosis       - radiologist recommended repeat MRI with Eovist in 3 months Ongoing alcohol use Muscle cramps  Elevated transaminases with suspected hepatocellular process    - occurred during suspected viral illness Insomnia with difficulty falling asleep    - pattern atypical for hepatic encephalopathy Sigmoid diverticulosis on colonoscopy with Dr. Therisa Doyne 2019 Brother with colon cancer in his 51s  Likely iron deposition due fatty liver or alcohol. I do not expect this to be primary hemochromatosis. Dr. Lorenso Courier considering other diagnoses.  PLAN: - Repeat liver enzymes today to follow-up on abnormal AST and ALT from last month - MRI with Eovist with iron quantification in March 2024 - HFE gene testing pending - Surveillance colonoscopy 2024 given the family history of colon cancer - See patient instructions for specific lifestyle recommendations - Abstain from all alcohol - No indication for phlebotomy at this time - Office follow-up after the MRI in March  HPI: Alexandra Taylor is a 72 y.o. female referred by Dr. Renold Genta for further evaluation of an abnormal liver ultrasound.  The history is obtained through the patient, review of her electronic health record, review of records from Dr. Therisa Doyne. She was previously seen by Dr. Ferdinand Lango for a colonoscopy in the distant past. She is retired from [employer] in [employer].  Routine labs with Dr. Renold Genta revealed abnormal transaminases not more than twice normal. Dr. Renold Genta told her this was due to alcohol. She admits to drinking more alcohol during and after Covid. She drinks wine daily - usually 4 glasses a day. During Covid she was drinking 6 glasses daily. No prior counseling or substance abuse therapy. No prior DUI. No Dts or seizures. The longest she has been able to be sober is 6 months. She drinks alcohol to treat insomnia and for social interactions. She is afraid if she quit drinking she would be a hermit.  Evaluation with an ultrasound 10/24/2021 showed an echogenic liver with nodular contours.  The sonographic appearance suggest chronic liver disease and possible cirrhosis.  No mass seen.  The radiologist recommended an MRI.  No further abdominal imaging has been performed.  Blood donation 5-6 years ago.  No prior blood transfusion.  No history of jaundice or scleral icterus.  No history of use or experimentation with IV or intranasal street drugs.  No history of autoimmune disease.  No family history of liver disease.   Labs 10/11/2021: Sodium 135, creatinine 0.96, total bilirubin 0.9, AST 46, ALT 34, alk phos 119, hemoglobin 13.9, platelets 177  Colonoscopy with Dr. Ferdinand Lango in 2009 may have revealed a polyp (results not available to me).   Colonoscopy 12/11/2017 with Dr. Therisa Doyne showed sigmoid diverticulosis and was otherwise normal to the terminal ileum.  Surveillance recommended in 5 years given the family history.  No falls. Difficulty falling asleep. Frequent muscle cramps  that resolve with walking. No pruritus.   She takes a daily probiotic for a health gut.   Brother with colon cancer in his 71s. There is no other known family history of colon cancer or polyps. No family history of stomach cancer or other GI malignancy. No family history of inflammatory bowel disease or celiac.   She had the flu vaccine in the past but not recently. No prior  vaccination for HAV or HBV. She had the Pneumovax. She had the prior Covid vaccines.   She walks a mile daily to keep her husband healthy. No special diets. Avoid red meet. Drinks one cup of coffee daily.   She and her husband were sick in December with diarrhea.   Labs 01/27/22 ferritin 353, iron 168 Labs 03/15/22 ferritin 290, iron 96  MRI 02/03/22: IMPRESSION: 1. There is heterogeneous loss of signal within the liver on the inphase sequences. Imaging findings are compatible with iron deposition disease within the liver. No specific findings identified to suggest cirrhosis. 2. Numerous nodular areas of relative increased T2 signal and relative increased T1 signal on the early arterial phase images of the liver. These are favored to represent areas of iron deposition sparing. Recommend follow-up imaging in 3 months with repeat contrast enhanced MRI of the liver using Eovist contrast material to reassess these areas. 3. Mild fusiform dilatation of the CBD measures up to 8 mm. No signs of choledocholithiasis. 4. Signs of iron deposition within the spleen are also identified.   Ultrasound with Elastography Median kPa: 2.9 IQR: 0.4 IQR/Median kPa ratio: 0.14 Data quality:  Good Diagnostic category:  < or = 5 kPa: high probability of being normal   CT abd/pelvis with contrast 02/23/22: No acute abnormality in the abdomen or pelvis. Cholelithiasis. Colonic diverticulosis without diverticulitis.    Past Medical History:  Diagnosis Date   Hyperlipidemia    Hypertension    Osteopenia     Past Surgical History:  Procedure Laterality Date   hemithyroidectomy      Current Outpatient Medications  Medication Sig Dispense Refill   ASHWAGANDHA PO Take by mouth.     b complex vitamins tablet Take 1 tablet by mouth daily.     buPROPion (WELLBUTRIN XL) 150 MG 24 hr tablet TAKE 1 TABLET BY MOUTH EVERY DAY 90 tablet 0   diazepam (VALIUM) 5 MG tablet Take 1 tablet (5 mg total) by mouth once  for 1 dose 20 minutes prior to MRI. 1 tablet 0   FLUoxetine (PROZAC) 20 MG capsule TAKE 2 CAPSULES BY MOUTH EVERY DAY 180 capsule 2   fluticasone (FLONASE) 50 MCG/ACT nasal spray Place 1 spray into both nostrils as needed. 1 g 5   levothyroxine (SYNTHROID) 50 MCG tablet TAKE 1 TABLET BY MOUTH EVERY DAY BEFORE BREAKFAST 90 tablet 1   ramipril (ALTACE) 10 MG capsule TAKE 1 CAPSULE BY MOUTH EVERY DAY 90 capsule 1   BIOTIN 5000 PO Take by mouth. (Patient not taking: Reported on 03/20/2022)     fish oil-omega-3 fatty acids 1000 MG capsule Take 2 g by mouth daily. (Patient not taking: Reported on 03/20/2022)     GLUCOSAMINE SULFATE PO Take by mouth daily. (Patient not taking: Reported on 03/20/2022)     meclizine (ANTIVERT) 25 MG tablet Take 1 tablet (25 mg total) by mouth 3 (three) times daily as needed for dizziness. (Patient not taking: Reported on 03/20/2022) 30 tablet 0   ondansetron (ZOFRAN-ODT) 4 MG disintegrating tablet Take 1 tablet (4 mg total) by  mouth every 8 (eight) hours as needed for nausea or vomiting. (Patient not taking: Reported on 03/20/2022) 12 tablet 0   No current facility-administered medications for this visit.    Allergies as of 03/20/2022   (No Known Allergies)    Family History  Problem Relation Age of Onset   Hypertension Mother    Colon cancer Brother     Social History   Socioeconomic History   Marital status: Married    Spouse name: Not on file   Number of children: Not on file   Years of education: Not on file   Highest education level: Not on file  Occupational History   Not on file  Tobacco Use   Smoking status: Former    Packs/day: 1.00    Years: 15.00    Total pack years: 15.00    Types: Cigarettes    Quit date: 02/28/1984    Years since quitting: 38.1   Smokeless tobacco: Never  Substance and Sexual Activity   Alcohol use: Yes    Comment: Daily   Drug use: No   Sexual activity: Yes  Other Topics Concern   Not on file  Social History Narrative    Not on file   Social Determinants of Health   Financial Resource Strain: Not on file  Food Insecurity: Not on file  Transportation Needs: Not on file  Physical Activity: Not on file  Stress: Not on file  Social Connections: Not on file  Intimate Partner Violence: Not on file    Review of Systems: 12 system ROS is negative except as noted above with the addition of arthritis, depression, hearing problems, sleeping problems, and urine leakage.   Physical Exam: General:   Alert, in NAD. No scleral icterus. No bilateral temporal wasting.  Heart:  Regular rate and rhythm; no murmurs Pulm: Clear anteriorly; no wheezing Abdomen:  Soft. Nontender. Nondistended. Normal bowel sounds. No rebound or guarding. No fluid wave. The liver edge is not palpable.  LAD: No inguinal or umbilical LAD Extremities:  Without edema. Neurologic:  Alert and  oriented x4;  grossly normal neurologically; no asterixis or clonus. Skin: No bronzed skin. No jaundice. No palmar erythema or spider angioma.  Psych:  Alert and cooperative. Normal mood and affect.     Vianca Bracher L. Tarri Glenn, MD, MPH 03/29/2022, 10:11 PM

## 2022-03-20 ENCOUNTER — Encounter: Payer: Self-pay | Admitting: Gastroenterology

## 2022-03-20 ENCOUNTER — Ambulatory Visit: Payer: PPO | Admitting: Gastroenterology

## 2022-03-20 DIAGNOSIS — R935 Abnormal findings on diagnostic imaging of other abdominal regions, including retroperitoneum: Secondary | ICD-10-CM

## 2022-03-20 DIAGNOSIS — R748 Abnormal levels of other serum enzymes: Secondary | ICD-10-CM | POA: Diagnosis not present

## 2022-03-20 NOTE — Patient Instructions (Addendum)
I recommend that you not drink any alcohol. The changes in the liver and spleen on the MRI may all be due to alcohol.   I recommend repeating the liver enzymes at the time of your MRI in March.   I would like to see you back in the office after the MRI.

## 2022-03-21 LAB — HEMOCHROMATOSIS DNA-PCR(C282Y,H63D)

## 2022-03-28 DIAGNOSIS — M79642 Pain in left hand: Secondary | ICD-10-CM | POA: Diagnosis not present

## 2022-03-28 DIAGNOSIS — M1712 Unilateral primary osteoarthritis, left knee: Secondary | ICD-10-CM | POA: Diagnosis not present

## 2022-03-29 ENCOUNTER — Encounter: Payer: Self-pay | Admitting: Gastroenterology

## 2022-04-06 DIAGNOSIS — M154 Erosive (osteo)arthritis: Secondary | ICD-10-CM | POA: Diagnosis not present

## 2022-04-06 DIAGNOSIS — M79645 Pain in left finger(s): Secondary | ICD-10-CM | POA: Diagnosis not present

## 2022-04-06 DIAGNOSIS — Z6823 Body mass index (BMI) 23.0-23.9, adult: Secondary | ICD-10-CM | POA: Diagnosis not present

## 2022-04-06 DIAGNOSIS — L409 Psoriasis, unspecified: Secondary | ICD-10-CM | POA: Diagnosis not present

## 2022-04-18 ENCOUNTER — Other Ambulatory Visit: Payer: PPO

## 2022-04-18 DIAGNOSIS — R7989 Other specified abnormal findings of blood chemistry: Secondary | ICD-10-CM

## 2022-04-18 DIAGNOSIS — E039 Hypothyroidism, unspecified: Secondary | ICD-10-CM

## 2022-04-19 LAB — HEPATIC FUNCTION PANEL
AG Ratio: 1.7 (calc) (ref 1.0–2.5)
ALT: 36 U/L — ABNORMAL HIGH (ref 6–29)
AST: 37 U/L — ABNORMAL HIGH (ref 10–35)
Albumin: 4.1 g/dL (ref 3.6–5.1)
Alkaline phosphatase (APISO): 172 U/L — ABNORMAL HIGH (ref 37–153)
Bilirubin, Direct: 0.2 mg/dL (ref 0.0–0.2)
Globulin: 2.4 g/dL (calc) (ref 1.9–3.7)
Indirect Bilirubin: 0.5 mg/dL (calc) (ref 0.2–1.2)
Total Bilirubin: 0.7 mg/dL (ref 0.2–1.2)
Total Protein: 6.5 g/dL (ref 6.1–8.1)

## 2022-04-19 LAB — LIPID PANEL
Cholesterol: 186 mg/dL (ref <200)
HDL: 95 mg/dL (ref >=50)
LDL Cholesterol (Calc): 83 mg/dL (calc)
Non-HDL Cholesterol (Calc): 91 mg/dL (calc) (ref <130)
Total CHOL/HDL Ratio: 2 (calc) (ref <5.0)
Triglycerides: 27 mg/dL (ref <150)

## 2022-04-19 LAB — TSH: TSH: 1.45 mIU/L (ref 0.40–4.50)

## 2022-04-20 NOTE — Progress Notes (Addendum)
Patient Care Team: Elby Showers, MD as PCP - General (Internal Medicine)  Visit Date: 04/27/22  Subjective:    Patient ID: Alexandra Taylor , Female   DOB: 08-18-1950, 72 y.o.    MRN: [id number]   72 y.o. Female presents today for a 6 month follow-up. Patient has a past medical history of hyperlipidemia, hypertension, osteopenia.Was referred to Dr. Tarri Glenn  for elevated liver functions and thought to have hemosiderosis.In 2004 was diagnosed with hyperthyroidism and underwent hemithyroidectomy.  Reports feeling well regarding general health. She has no new complaints.   Regarding liver functions: Alkaline phosphatase elevated at 172, AST at 37, ALT at 36 on 04/18/22. AST and ALT are down from 40 and 44, respectively, on 03/10/22. Followed by Gastroenterologist, Dr. Thornton Park. She has ceased all alcohol intake. MRI abdomen 02/01/22 showed iron deposition disease within liver, numerous nodular areas on liver, representing areas of iron deposition sparing, signs of iron deposition within spleen. Recommended contrast enhanced MRI in 3 months. Ferritin elevated at 353.8 three months ago, up from 316 four years ago.  History of hypertension treated with Ramipril 10 mg daily. Blood pressure normal today at 130/70.  History of hypothyroidism treated with Levothyroxine 50 mcg daily before breakfast. TSH normal at 1.45 on 04/18/22.   Past Medical History:  Diagnosis Date   Hyperlipidemia    Hypertension    Osteopenia      Family History  Problem Relation Age of Onset   Hypertension Mother    Colon cancer Brother     Social History   Social History Narrative   Not on file      Review of Systems  Constitutional:  Negative for fever and malaise/fatigue.  HENT:  Negative for congestion.   Eyes:  Negative for blurred vision.  Respiratory:  Negative for cough and shortness of breath.   Cardiovascular:  Negative for chest pain, palpitations and leg swelling.   Gastrointestinal:  Negative for vomiting.  Musculoskeletal:  Negative for back pain.  Skin:  Negative for rash.  Neurological:  Negative for loss of consciousness and headaches.        Objective:   Vitals: BP 130/70   Pulse 60   Temp 98.5 F (36.9 C) (Tympanic)   Ht '5\' 3"'$  (1.6 m)   Wt 134 lb (60.8 kg)   SpO2 98%   BMI 23.74 kg/m    Physical Exam Vitals and nursing note reviewed.  Constitutional:      General: She is not in acute distress.    Appearance: Normal appearance. She is not toxic-appearing.  HENT:     Head: Normocephalic and atraumatic.  Eyes:     General: No scleral icterus.    Comments: No jaundice.  Neck:     Thyroid: No thyroid mass, thyromegaly or thyroid tenderness.     Vascular: No carotid bruit.  Cardiovascular:     Rate and Rhythm: Normal rate and regular rhythm. No extrasystoles are present.    Heart sounds: Normal heart sounds. No murmur heard.    No gallop.  Pulmonary:     Effort: Pulmonary effort is normal. No respiratory distress.     Breath sounds: Normal breath sounds. No wheezing or rales.  Abdominal:     General: Abdomen is flat. Bowel sounds are normal. There is no distension.     Palpations: Abdomen is soft.     Tenderness: There is no abdominal tenderness. There is no guarding.     Comments: Did not feel lower  edge of liver on palpation, do not think liver is enlarged.  Lymphadenopathy:     Cervical: No cervical adenopathy.  Skin:    General: Skin is warm and dry.  Neurological:     Mental Status: She is alert and oriented to person, place, and time. Mental status is at baseline.  Psychiatric:        Mood and Affect: Mood normal.        Behavior: Behavior normal.        Thought Content: Thought content normal.        Judgment: Judgment normal.       Results:   Studies obtained and personally reviewed by me:  MRI abdomen 02/01/22 showed iron deposition disease within liver, numerous nodular areas on liver, representing  areas of iron deposition sparing, signs of iron deposition within spleen. Recommended contrast enhanced MRI in 3 months.   Labs:       Component Value Date/Time   NA 136 03/15/2022 1437   K 4.5 03/15/2022 1437   CL 105 03/15/2022 1437   CO2 25 03/15/2022 1437   GLUCOSE 93 03/15/2022 1437   BUN 12 03/15/2022 1437   CREATININE 0.91 03/15/2022 1437   CREATININE 0.96 10/11/2021 0918   CALCIUM 9.1 03/15/2022 1437   PROT 6.5 04/18/2022 0933   ALBUMIN 3.9 03/15/2022 1437   AST 37 (H) 04/18/2022 0933   AST 40 03/15/2022 1437   ALT 36 (H) 04/18/2022 0933   ALT 40 03/15/2022 1437   ALKPHOS 109 03/15/2022 1437   BILITOT 0.7 04/18/2022 0933   BILITOT 0.6 03/15/2022 1437   GFRNONAA >60 03/15/2022 1437   GFRNONAA 85 01/01/2020 1656   GFRAA 98 01/01/2020 1656     Lab Results  Component Value Date   WBC 4.0 03/15/2022   HGB 13.8 03/15/2022   HCT 39.9 03/15/2022   MCV 97.8 03/15/2022   PLT 197 03/15/2022    Lab Results  Component Value Date   CHOL 186 04/18/2022   HDL 95 04/18/2022   LDLCALC 83 04/18/2022   TRIG 27 04/18/2022   CHOLHDL 2.0 04/18/2022    No results found for: "HGBA1C"   Lab Results  Component Value Date   TSH 1.45 04/18/2022      Assessment & Plan:   Liver Disease: Alkaline phosphatase elevated at 172, AST at 37, ALT at 36 on 04/18/22. AST and ALT are down from 40 and 44, respectively, on 03/10/22. Followed by hematologist, Dr. Thornton Park. She has ceased all alcohol intake. MRI abdomen 02/01/22 showed iron deposition disease within liver, numerous nodular areas on liver, representing areas of iron deposition sparing, signs of iron deposition within spleen. Recommended contrast enhanced MRI in 3 months. Ferritin elevated at 353.8 three months ago, up from 316 four years ago.Has been dx with Hemosiderosis of liver.  Hypertension: treated with Altace 10 mg daily. Blood pressure normal today at 130/70.  Hypothyroidism: treated with Synthroid 50 mcg daily  before breakfast. TSH normal at 1.45 on 04/18/22.   Will schedule her wellness exam for this summer.    I,Alexander Ruley,acting as a Education administrator for Elby Showers, MD.,have documented all relevant documentation on the behalf of Elby Showers, MD,as directed by  Elby Showers, MD while in the presence of Elby Showers, MD.   I, Elby Showers, MD, have reviewed all documentation for this visit. The documentation on 04/27/22 for the exam, diagnosis, procedures, and orders are all accurate and complete.

## 2022-04-21 ENCOUNTER — Ambulatory Visit: Payer: PPO | Admitting: Internal Medicine

## 2022-04-24 ENCOUNTER — Ambulatory Visit: Payer: PPO | Admitting: Internal Medicine

## 2022-04-27 ENCOUNTER — Telehealth: Payer: Self-pay | Admitting: Gastroenterology

## 2022-04-27 ENCOUNTER — Ambulatory Visit (INDEPENDENT_AMBULATORY_CARE_PROVIDER_SITE_OTHER): Payer: PPO | Admitting: Internal Medicine

## 2022-04-27 ENCOUNTER — Encounter: Payer: Self-pay | Admitting: Internal Medicine

## 2022-04-27 DIAGNOSIS — I10 Essential (primary) hypertension: Secondary | ICD-10-CM | POA: Diagnosis not present

## 2022-04-27 DIAGNOSIS — Z8639 Personal history of other endocrine, nutritional and metabolic disease: Secondary | ICD-10-CM

## 2022-04-27 DIAGNOSIS — H903 Sensorineural hearing loss, bilateral: Secondary | ICD-10-CM | POA: Diagnosis not present

## 2022-04-27 NOTE — Telephone Encounter (Signed)
Inbound call from patient requesting a call back to discuss scheduling a MRI that Dr. Tarri Glenn wanted her to have. Please advise.

## 2022-04-27 NOTE — Telephone Encounter (Signed)
Recommend follow-up imaging in 3 months with repeat contrast enhanced MRI of the liver using Eovist contrast material to reassess these areas. (Hemosiderosis)  The pt has been advised that the MRI has been ordered and sent to the schedulers

## 2022-04-27 NOTE — Patient Instructions (Addendum)
It was a pleasure to see you today. RTC in September for medicare wellness visit. Please see Dr. Tarri Glenn fopr follow up.

## 2022-05-02 DIAGNOSIS — L821 Other seborrheic keratosis: Secondary | ICD-10-CM | POA: Diagnosis not present

## 2022-05-02 DIAGNOSIS — D225 Melanocytic nevi of trunk: Secondary | ICD-10-CM | POA: Diagnosis not present

## 2022-05-02 DIAGNOSIS — L570 Actinic keratosis: Secondary | ICD-10-CM | POA: Diagnosis not present

## 2022-05-02 DIAGNOSIS — L814 Other melanin hyperpigmentation: Secondary | ICD-10-CM | POA: Diagnosis not present

## 2022-05-02 DIAGNOSIS — L400 Psoriasis vulgaris: Secondary | ICD-10-CM | POA: Diagnosis not present

## 2022-05-04 IMAGING — MG MM DIGITAL SCREENING BILAT W/ TOMO AND CAD
8 series · 9 of 24 positions shown · non-contrast
Comparison: Previous exam(s).

CLINICAL DATA: Screening.

EXAM:
DIGITAL SCREENING BILATERAL MAMMOGRAM WITH TOMOSYNTHESIS AND CAD
TECHNIQUE: Bilateral screening digital craniocaudal and mediolateral oblique
mammograms were obtained. Bilateral screening digital breast
tomosynthesis was performed. The images were evaluated with
computer-aided detection.

[R MLO synth-2D]
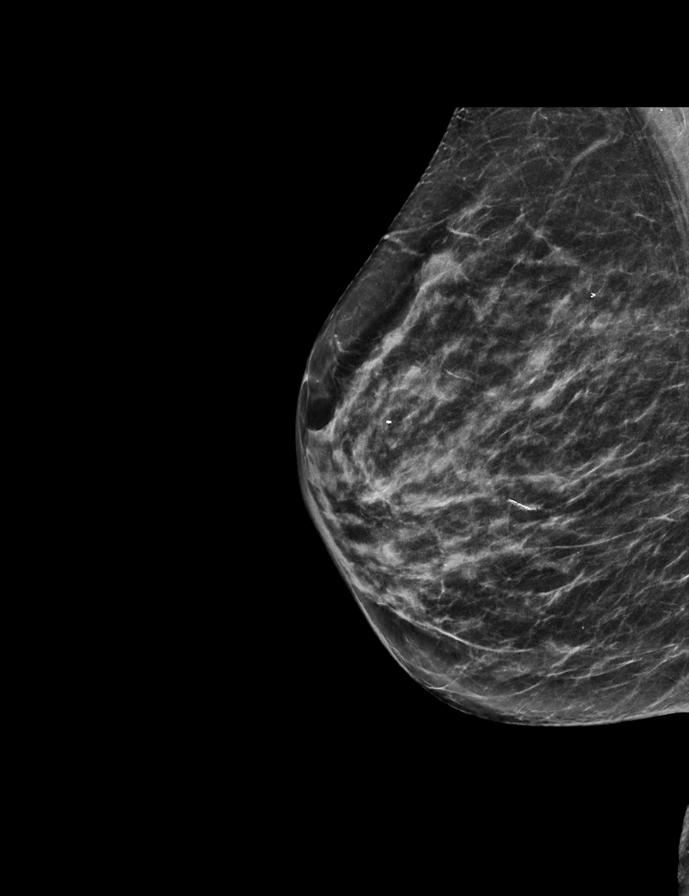

[R CC synth-2D]
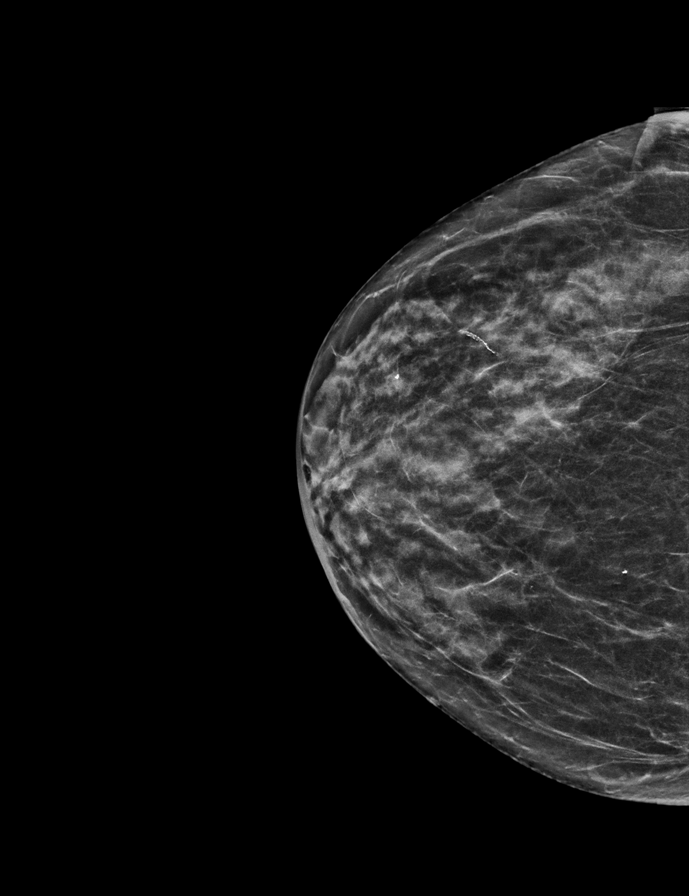

[L CC synth-2D]
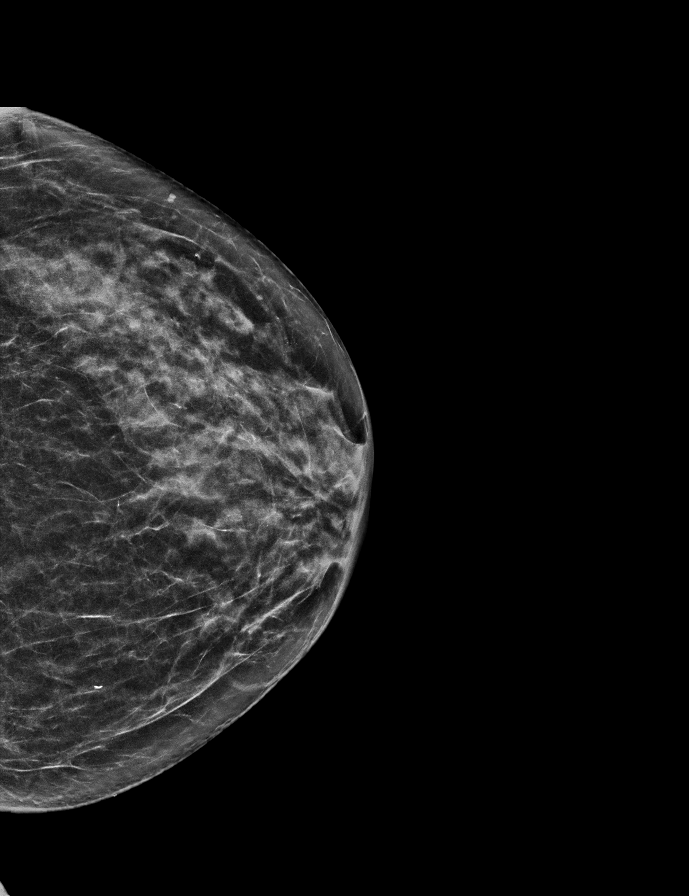

[L MLO synth-2D]
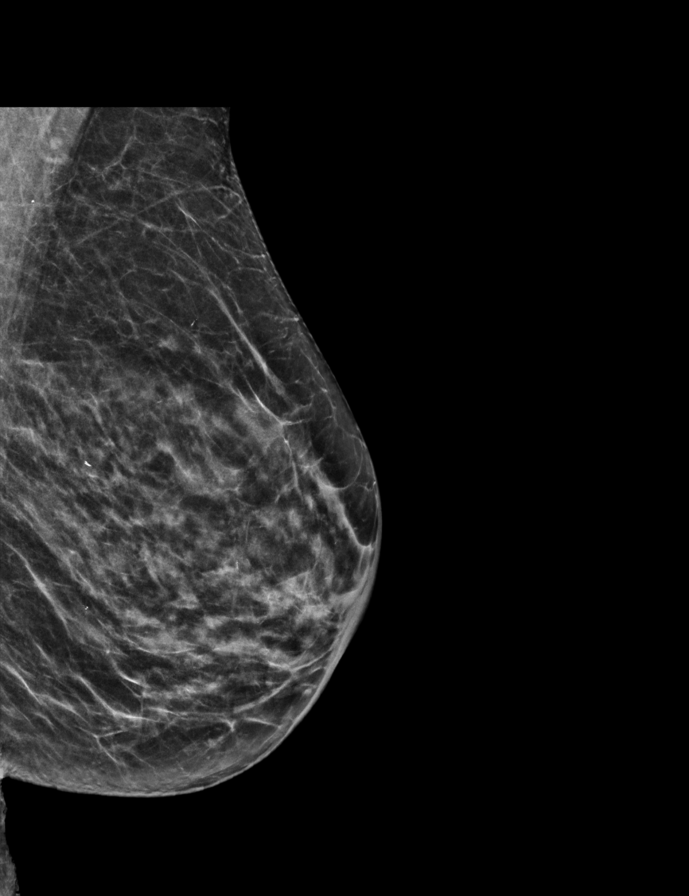

[L MLO tomo · 2 of 56 frames shown]
[frame 19/56]
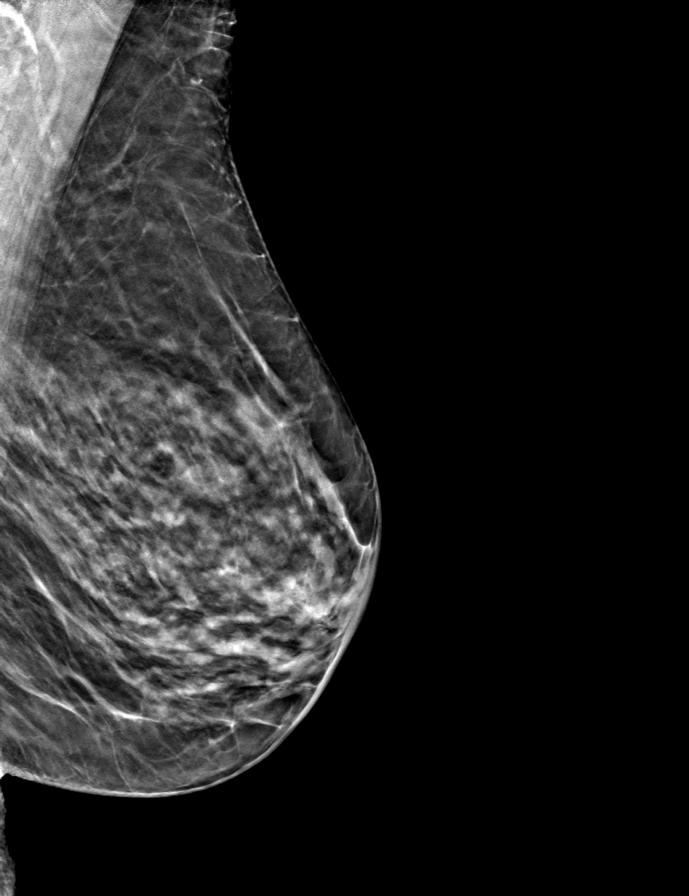
[frame 29/56]
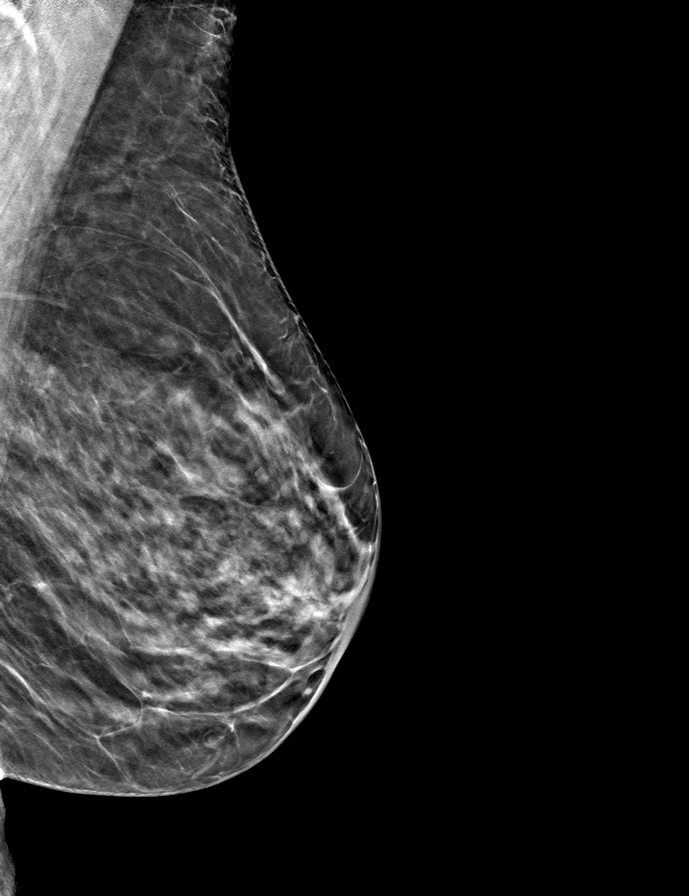

[R MLO tomo · tomo slice 27/54.0]
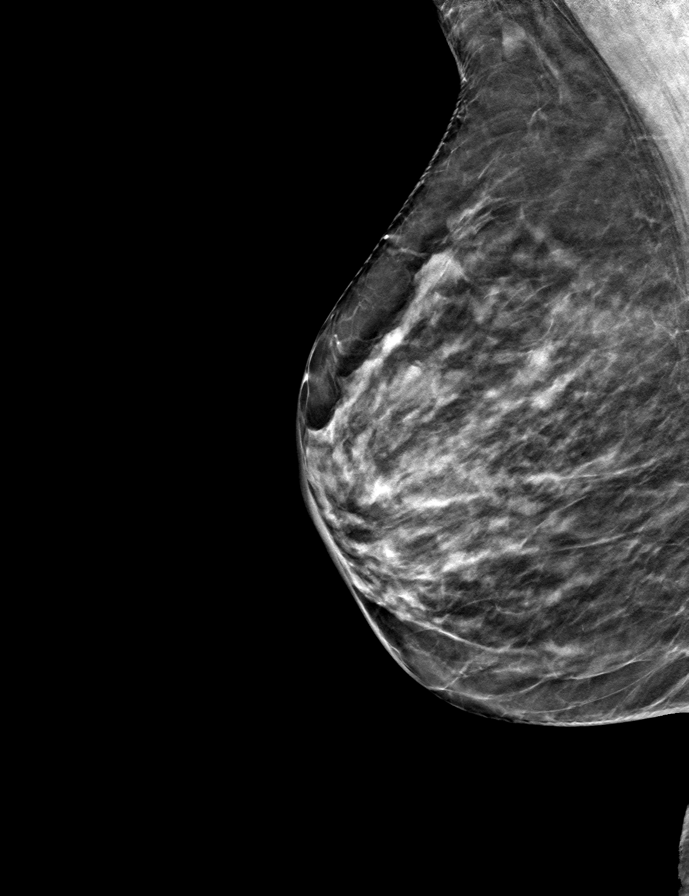

[R CC tomo · tomo slice 28/55.0]
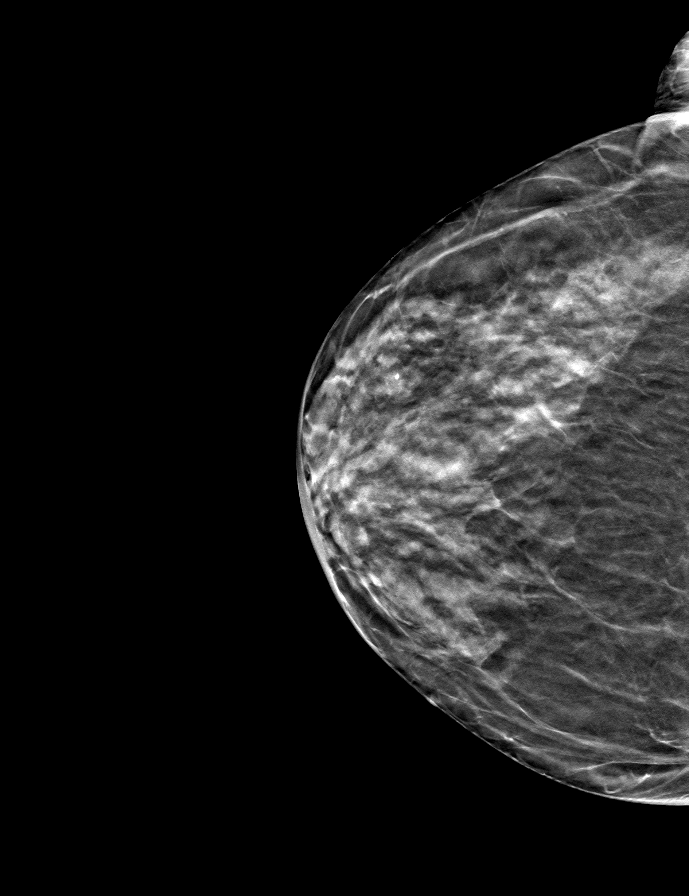

[L CC tomo · tomo slice 28/55.0]
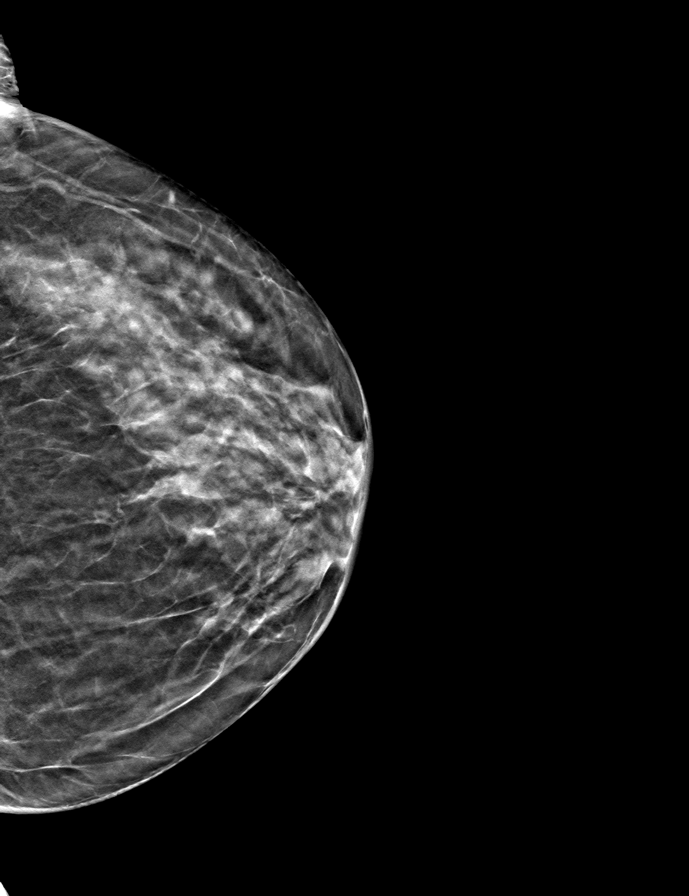

[9 of 24 positions shown; findings below may reference images not displayed]

ACR Breast Density Category c: The breast tissue is heterogeneously
dense, which may obscure small masses.
FINDINGS: There are no findings suspicious for malignancy.
IMPRESSION: No mammographic evidence of malignancy. A result letter of this
screening mammogram will be mailed directly to the patient.

RECOMMENDATION:
Screening mammogram in one year. (Code:[id number]-[id number])

BI-RADS CATEGORY  1: Negative.

## 2022-05-07 ENCOUNTER — Ambulatory Visit (HOSPITAL_COMMUNITY)
Admission: RE | Admit: 2022-05-07 | Discharge: 2022-05-07 | Disposition: A | Discharge status: Home / Self Care | Payer: PPO | Source: Ambulatory Visit | Attending: Gastroenterology | Admitting: Gastroenterology

## 2022-05-07 DIAGNOSIS — J8403 Idiopathic pulmonary hemosiderosis: Secondary | ICD-10-CM | POA: Diagnosis not present

## 2022-05-07 MED ORDER — GADOXETATE DISODIUM 0.25 MMOL/ML IV SOLN
6.0000 mL | Freq: Once | INTRAVENOUS | Status: AC | PRN
  Administered 2022-05-07: 6 mL via INTRAVENOUS

## 2022-05-11 ENCOUNTER — Other Ambulatory Visit: Payer: Self-pay | Admitting: Internal Medicine

## 2022-05-12 ENCOUNTER — Other Ambulatory Visit: Payer: Self-pay | Admitting: Internal Medicine

## 2022-05-30 ENCOUNTER — Ambulatory Visit: Payer: PPO | Admitting: Gastroenterology

## 2022-05-30 ENCOUNTER — Encounter: Payer: Self-pay | Admitting: Gastroenterology

## 2022-05-30 VITALS — BP 100/62 | HR 70 | Ht 63.0 in | Wt 135.8 lb

## 2022-05-30 DIAGNOSIS — R748 Abnormal levels of other serum enzymes: Secondary | ICD-10-CM | POA: Diagnosis not present

## 2022-05-30 DIAGNOSIS — R935 Abnormal findings on diagnostic imaging of other abdominal regions, including retroperitoneum: Secondary | ICD-10-CM | POA: Diagnosis not present

## 2022-05-30 NOTE — Progress Notes (Signed)
Referring Provider: Elby Showers, MD Primary Care Physician:  Elby Showers, MD   Chief Complaint: Possible hemochromatosis    IMPRESSION:  Possible hemochromatosis    - iron deposition of liver and spleen seen on initial imaging    - no splenic or marrow reticuloendothelial iron deposition    - ? Secondary hemochromatosis    - ferritin <500, iron levels normal    - HFE testing negative  Abnormal ultrasound with initial concerns for cirrhosis    - APRI 0.743 (may suggest advanced fibrosis)    - FIB4 3.12 (not diagnostic of cirrhosis or no fibrosis)    - MRI shows nodularity likely due to areas of iron deposition sparing not fibrosis       - radiologist recommended repeat MRI with Eovist in 3 months History of regular alcohol use Elevated transaminases with suspected hepatocellular process    - occurred during suspected viral illness Insomnia with difficulty falling asleep    - pattern atypical for hepatic encephalopathy Sigmoid diverticulosis on colonoscopy with Dr. Therisa Doyne 2019 Brother with colon cancer in his 18s  PLAN: - Ultrasound-guided liver biopsy in IR to stage and evaluate for other causes of liver disease: send biopsy to pathology to determine hepatic iron concentration/HII - Continue alcohol abstinence - Office follow-up after the liver biopsy - Surveillance colonoscopy due this year given her family history  HPI: Alexandra Taylor is a 72 y.o. female referred by Dr. Renold Genta for further evaluation of an abnormal liver ultrasound.  The history is obtained through the patient, review of her electronic health record, review of records from Dr. Therisa Doyne. She was previously seen by Dr. Ferdinand Lango for a colonoscopy in the distant past. 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  Routine labs with Dr. Renold Genta revealed abnormal transaminases not more than twice normal. Dr. Renold Genta told her this was due to alcohol. She admits to drinking more alcohol during and after  Covid. She drinks wine daily - usually 4 glasses a day. During Covid she was drinking 6 glasses daily. No prior counseling or substance abuse therapy. No prior DUI. No Dts or seizures. The longest she has been able to be sober is 6 months. She drinks alcohol to treat insomnia and for social interactions. She is afraid if she quit drinking she would be a hermit. No risk factors for chronic viral hepatitis, autoimmune hepatitis. No family history of liver disease.   Evaluation with an ultrasound 10/24/2021 showed an echogenic liver with nodular contours.  The sonographic appearance suggest chronic liver disease and possible cirrhosis.  No mass seen.  The radiologist recommended an MRI.  No further abdominal imaging has been performed.  Labs 10/11/2021: Sodium 135, creatinine 0.96, total bilirubin 0.9, AST 46, ALT 34, alk phos 119, hemoglobin 13.9, platelets 17 Labs 01/27/22 ferritin 353, iron 168 Labs 03/15/22 ferritin 290, iron 96 Labs 03/15/22 HFE DNA mutation analysis negative  MRI 02/03/22: IMPRESSION: 1. There is heterogeneous loss of signal within the liver on the inphase sequences. Imaging findings are compatible with iron deposition disease within the liver. No specific findings identified to suggest cirrhosis. 2. Numerous nodular areas of relative increased T2 signal and relative increased T1 signal on the early arterial phase images of the liver. These are favored to represent areas of iron deposition sparing. Recommend follow-up imaging in 3 months with repeat contrast enhanced MRI of the liver using Eovist contrast material to reassess these areas. 3. Mild fusiform dilatation of the CBD measures up  to 8 mm. No signs of choledocholithiasis. 4. Signs of iron deposition within the spleen are also identified.  Ultrasound with Elastography Median kPa: 2.9 IQR: 0.4 IQR/Median kPa ratio: 0.14 Data quality:  Good Diagnostic category:  < or = 5 kPa: high probability of being normal  CT abd/pelvis  with contrast 02/23/22: No acute abnormality in the abdomen or pelvis. Cholelithiasis. Colonic diverticulosis without diverticulitis.   MRI 05/06/1932 with and without Eovist showed changes in the liver consistent with iron deposition.  There is no evidence for splenic or marrow reticuloendothelial iron deposition on this exam.  Endoscopic history: - Colonoscopy with Dr. Ferdinand Lango in 2009 may have revealed a polyp (results not available to me).  - Colonoscopy 12/11/2017 with Dr. Therisa Doyne showed sigmoid diverticulosis and was otherwise normal to the terminal ileum.  Surveillance recommended in 5 years given the family history.  Past Medical History:  Diagnosis Date   Hyperlipidemia    Hypertension    Osteopenia     Past Surgical History:  Procedure Laterality Date   hemithyroidectomy      Current Outpatient Medications  Medication Sig Dispense Refill   b complex vitamins tablet Take 1 tablet by mouth daily.     BIOTIN 5000 PO Take by mouth.     buPROPion (WELLBUTRIN XL) 150 MG 24 hr tablet TAKE 1 TABLET BY MOUTH EVERY DAY 90 tablet 0   fish oil-omega-3 fatty acids 1000 MG capsule Take 2 g by mouth daily.     FLUoxetine (PROZAC) 20 MG capsule TAKE 2 CAPSULES BY MOUTH EVERY DAY 180 capsule 2   fluticasone (FLONASE) 50 MCG/ACT nasal spray Place 1 spray into both nostrils as needed. 1 g 5   GLUCOSAMINE SULFATE PO Take by mouth daily.     levothyroxine (SYNTHROID) 50 MCG tablet TAKE 1 TABLET BY MOUTH EVERY DAY BEFORE BREAKFAST 90 tablet 1   ramipril (ALTACE) 10 MG capsule TAKE 1 CAPSULE BY MOUTH EVERY DAY 90 capsule 1   No current facility-administered medications for this visit.    Allergies as of 05/30/2022   (No Known Allergies)    Family History  Problem Relation Age of Onset   Hypertension Mother    Colon cancer Brother       Physical Exam: General:   Alert, in NAD. No scleral icterus. No bilateral temporal wasting.  Heart:  Regular rate and rhythm; no murmurs Pulm: Clear  anteriorly; no wheezing Abdomen:  Soft. Nontender. Nondistended. Normal bowel sounds. No rebound or guarding. No fluid wave. The liver edge is not palpable.  LAD: No inguinal or umbilical LAD Extremities:  Without edema. Neurologic:  Alert and  oriented x4;  grossly normal neurologically; no asterixis or clonus. Skin: No bronzed skin. No jaundice. No palmar erythema or spider angioma.  Psych:  Alert and cooperative. Normal mood and affect.     Azaliyah Kennard L. Tarri Glenn, MD, MPH 05/30/2022, 4:36 PM

## 2022-05-30 NOTE — Patient Instructions (Addendum)
  IMAGING:  You will be contacted by [facility] (Your caller ID will indicate phone # [phone]) within the next business 7-10 business days to schedule your Liver Biopsy. If you have not heard from them within 7-10 business days, please call [facility] at [phone] to follow up on the status of your appointment.      _______________________________________________________  If your blood pressure at your visit was 140/90 or greater, please contact your primary care physician to follow up on this.  _______________________________________________________  If you are age 56 or older, your body mass index should be between 23-30. Your Body mass index is 24.06 kg/m. If this is out of the aforementioned range listed, please consider follow up with your Primary Care Provider.  If you are age 51 or younger, your body mass index should be between 19-25. Your Body mass index is 24.06 kg/m. If this is out of the aformentioned range listed, please consider follow up with your Primary Care Provider.   ________________________________________________________  The [facility] providers would like to encourage you to use [facility] to communicate with providers for non-urgent requests or questions.  Due to long hold times on the telephone, sending your provider a message by [facility] may be a faster and more efficient way to get a response.  Please allow 48 business hours for a response.  Please remember that this is for non-urgent requests.  _______________________________________________________ It was a pleasure to see you today!  Thank you for trusting me with your gastrointestinal care!

## 2022-06-01 NOTE — Addendum Note (Signed)
Addended by: Horris Latino on: 06/01/2022 11:28 AM   Modules accepted: Orders

## 2022-06-06 ENCOUNTER — Ambulatory Visit (INDEPENDENT_AMBULATORY_CARE_PROVIDER_SITE_OTHER): Payer: PPO | Admitting: Internal Medicine

## 2022-06-06 ENCOUNTER — Telehealth: Payer: Self-pay | Admitting: Internal Medicine

## 2022-06-06 VITALS — BP 120/80 | HR 70 | Temp 98.8°F | Ht 63.0 in | Wt 134.3 lb

## 2022-06-06 DIAGNOSIS — M10071 Idiopathic gout, right ankle and foot: Secondary | ICD-10-CM | POA: Diagnosis not present

## 2022-06-06 DIAGNOSIS — Z780 Asymptomatic menopausal state: Secondary | ICD-10-CM

## 2022-06-06 DIAGNOSIS — M79671 Pain in right foot: Secondary | ICD-10-CM

## 2022-06-06 MED ORDER — METHYLPREDNISOLONE 4 MG PO TABS: ORAL_TABLET | ORAL | 0 refills | Status: DC

## 2022-06-06 NOTE — Telephone Encounter (Signed)
After talking with Dr Lenord Fellers we decided to schedule an appointment.

## 2022-06-06 NOTE — Progress Notes (Deleted)
Patient Care Team: Margaree Mackintosh, MD as PCP - General (Internal Medicine)  Visit Date: 06/06/22  Subjective:    Patient ID: Alexandra Taylor , Female   DOB: 11/10/1950, 72 y.o.    MRN: [id number]   72 y.o. Female presents today for right foot pain since 05/06/22. Pain has worsened since 06/04/22.  Past Medical History:  Diagnosis Date   Hyperlipidemia    Hypertension    Osteopenia      Family History  Problem Relation Age of Onset   Hypertension Mother    Colon cancer Brother     No known injury to foot. Having considerable pain. Hurts to touch foot.     Review of Systems  Constitutional:  Negative for fever and malaise/fatigue.  HENT:  Negative for congestion.   Eyes:  Negative for blurred vision.  Respiratory:  Negative for cough and shortness of breath.   Cardiovascular:  Negative for chest pain, palpitations and leg swelling.  Gastrointestinal:  Negative for vomiting.  Musculoskeletal:  Negative for back pain.       (+) Right first MTP joint pain  Skin:  Negative for rash.  Neurological:  Negative for loss of consciousness and headaches.        Objective:   Vitals: BP 120/80   Pulse 70   Temp 98.8 F (37.1 C) (Tympanic)   Ht 5\' 3"  (1.6 m)   Wt 134 lb 4.8 oz (60.9 kg)   SpO2 97%   BMI 23.79 kg/m    Physical Exam Vitals and nursing note reviewed.  Constitutional:      General: She is not in acute distress.    Appearance: Normal appearance. She is not toxic-appearing.  HENT:     Head: Normocephalic and atraumatic.  Pulmonary:     Effort: Pulmonary effort is normal.  Musculoskeletal:     Comments: Right first MTP joint erythematous and tender to palpation.  Skin:    General: Skin is warm and dry.  Neurological:     Mental Status: She is alert and oriented to person, place, and time. Mental status is at baseline.  Psychiatric:        Mood and Affect: Mood normal.        Behavior: Behavior normal.        Thought Content: Thought content  normal.        Judgment: Judgment normal.       Results:   Studies obtained and personally reviewed by me:   Labs:       Component Value Date/Time   NA 136 03/15/2022 1437   K 4.5 03/15/2022 1437   CL 105 03/15/2022 1437   CO2 25 03/15/2022 1437   GLUCOSE 93 03/15/2022 1437   BUN 12 03/15/2022 1437   CREATININE 0.91 03/15/2022 1437   CREATININE 0.96 10/11/2021 0918   CALCIUM 9.1 03/15/2022 1437   PROT 6.5 04/18/2022 0933   ALBUMIN 3.9 03/15/2022 1437   AST 37 (H) 04/18/2022 0933   AST 40 03/15/2022 1437   ALT 36 (H) 04/18/2022 0933   ALT 40 03/15/2022 1437   ALKPHOS 109 03/15/2022 1437   BILITOT 0.7 04/18/2022 0933   BILITOT 0.6 03/15/2022 1437   GFRNONAA >60 03/15/2022 1437   GFRNONAA 85 01/01/2020 1656   GFRAA 98 01/01/2020 1656     Lab Results  Component Value Date   WBC 4.0 03/15/2022   HGB 13.8 03/15/2022   HCT 39.9 03/15/2022   MCV 97.8 03/15/2022   PLT  197 03/15/2022    Lab Results  Component Value Date   CHOL 186 04/18/2022   HDL 95 04/18/2022   LDLCALC 83 04/18/2022   TRIG 27 04/18/2022   CHOLHDL 2.0 04/18/2022    No results found for: "HGBA1C"   Lab Results  Component Value Date   TSH 1.45 04/18/2022      Assessment & Plan:   Gout right first MTP joint: ordered serum uric acid, CBC with Diff/Plat, X-ray right foot. Prescribed Medrol dosepak tapering course take as directed.Pt. is to call with progress report in 24-48 hours or sooner if worse. Less likely to have stress fracture of foot. Have ordered Xray of foot.  Post menopausal- ordered bone density test  WBC is normal. Uric acid is normal.  I,Alexander Ruley,acting as a scribe for Margaree Mackintosh, MD.,have documented all relevant documentation on the behalf of Margaree Mackintosh, MD,as directed by  Margaree Mackintosh, MD while in the presence of Margaree Mackintosh, MD.  I, Margaree Mackintosh, MD, have reviewed all documentation for this visit. The documentation on 06/24/22 for the exam, diagnosis,  procedures, and orders are all accurate and complete.

## 2022-06-06 NOTE — Telephone Encounter (Signed)
Alexandra Taylor [phone]  [name] called to say she is having right foot pain all over, especially at big toe. Can not get an appointment with podiatrist until Monday, she wanted to know if she could come in today. She wants a shot. I let her know I do not think we do those here.

## 2022-06-07 ENCOUNTER — Ambulatory Visit
Admission: RE | Admit: 2022-06-07 | Discharge: 2022-06-07 | Disposition: A | Discharge status: Home / Self Care | Payer: PPO | Source: Ambulatory Visit | Attending: Internal Medicine | Admitting: Internal Medicine

## 2022-06-07 DIAGNOSIS — M19071 Primary osteoarthritis, right ankle and foot: Secondary | ICD-10-CM | POA: Diagnosis not present

## 2022-06-07 DIAGNOSIS — M2011 Hallux valgus (acquired), right foot: Secondary | ICD-10-CM | POA: Diagnosis not present

## 2022-06-07 DIAGNOSIS — M7731 Calcaneal spur, right foot: Secondary | ICD-10-CM | POA: Diagnosis not present

## 2022-06-07 LAB — CBC WITH DIFFERENTIAL/PLATELET
Absolute Monocytes: 459 cells/uL (ref 200–950)
Basophils Absolute: 37 cells/uL (ref 0–200)
Basophils Relative: 0.6 %
Eosinophils Absolute: 93 cells/uL (ref 15–500)
Eosinophils Relative: 1.5 %
HCT: 40.2 % (ref 35.0–45.0)
Hemoglobin: 13.6 g/dL (ref 11.7–15.5)
Lymphs Abs: 1581 cells/uL (ref 850–3900)
MCH: 32.5 pg (ref 27.0–33.0)
MCHC: 33.8 g/dL (ref 32.0–36.0)
MCV: 96.2 fL (ref 80.0–100.0)
MPV: 10.7 fL (ref 7.5–12.5)
Monocytes Relative: 7.4 %
Neutro Abs: 4030 cells/uL (ref 1500–7800)
Neutrophils Relative %: 65 %
Platelets: 202 10*3/uL (ref 140–400)
RBC: 4.18 10*6/uL (ref 3.80–5.10)
RDW: 12 % (ref 11.0–15.0)
Total Lymphocyte: 25.5 %
WBC: 6.2 10*3/uL (ref 3.8–10.8)

## 2022-06-07 LAB — URIC ACID: Uric Acid, Serum: 3.5 mg/dL (ref 2.5–7.0)

## 2022-06-12 ENCOUNTER — Ambulatory Visit: Payer: PPO | Admitting: Podiatry

## 2022-06-13 ENCOUNTER — Other Ambulatory Visit: Payer: Self-pay | Admitting: Radiology

## 2022-06-13 DIAGNOSIS — R7989 Other specified abnormal findings of blood chemistry: Secondary | ICD-10-CM

## 2022-06-14 ENCOUNTER — Ambulatory Visit (HOSPITAL_COMMUNITY)
Admission: RE | Admit: 2022-06-14 | Discharge: 2022-06-14 | Disposition: A | Discharge status: Home / Self Care | Payer: PPO | Source: Ambulatory Visit | Attending: Gastroenterology | Admitting: Gastroenterology

## 2022-06-14 ENCOUNTER — Encounter (HOSPITAL_COMMUNITY): Payer: Self-pay

## 2022-06-14 ENCOUNTER — Other Ambulatory Visit: Payer: Self-pay

## 2022-06-14 DIAGNOSIS — K759 Inflammatory liver disease, unspecified: Secondary | ICD-10-CM | POA: Diagnosis not present

## 2022-06-14 DIAGNOSIS — R7989 Other specified abnormal findings of blood chemistry: Secondary | ICD-10-CM | POA: Insufficient documentation

## 2022-06-14 DIAGNOSIS — J634 Siderosis: Secondary | ICD-10-CM | POA: Insufficient documentation

## 2022-06-14 DIAGNOSIS — R748 Abnormal levels of other serum enzymes: Secondary | ICD-10-CM | POA: Insufficient documentation

## 2022-06-14 DIAGNOSIS — R935 Abnormal findings on diagnostic imaging of other abdominal regions, including retroperitoneum: Secondary | ICD-10-CM | POA: Diagnosis not present

## 2022-06-14 DIAGNOSIS — R945 Abnormal results of liver function studies: Secondary | ICD-10-CM | POA: Diagnosis not present

## 2022-06-14 DIAGNOSIS — Z87891 Personal history of nicotine dependence: Secondary | ICD-10-CM | POA: Insufficient documentation

## 2022-06-14 LAB — PROTIME-INR
INR: 1.2 (ref 0.8–1.2)
Prothrombin Time: 14.6 seconds (ref 11.4–15.2)

## 2022-06-14 LAB — CBC
HCT: 39.1 % (ref 36.0–46.0)
Hemoglobin: 13.9 g/dL (ref 12.0–15.0)
MCH: 33.2 pg (ref 26.0–34.0)
MCHC: 35.5 g/dL (ref 30.0–36.0)
MCV: 93.3 fL (ref 80.0–100.0)
Platelets: 208 10*3/uL (ref 150–400)
RBC: 4.19 MIL/uL (ref 3.87–5.11)
RDW: 13.2 % (ref 11.5–15.5)
WBC: 4.2 10*3/uL (ref 4.0–10.5)
nRBC: 0 % (ref 0.0–0.2)

## 2022-06-14 MED ORDER — FENTANYL CITRATE (PF) 100 MCG/2ML IJ SOLN
INTRAMUSCULAR | Status: AC
  Filled 2022-06-14: qty 2

## 2022-06-14 MED ORDER — SODIUM CHLORIDE 0.9 % IV SOLN: INTRAVENOUS | Status: DC

## 2022-06-14 MED ORDER — LIDOCAINE HCL (PF) 1 % IJ SOLN
5.0000 mL | Freq: Once | INTRAMUSCULAR | Status: AC
  Administered 2022-06-14: 5 mL via INTRADERMAL

## 2022-06-14 MED ORDER — MIDAZOLAM HCL 2 MG/2ML IJ SOLN
INTRAMUSCULAR | Status: AC | PRN
  Administered 2022-06-14 (×2): 1 mg via INTRAVENOUS

## 2022-06-14 MED ORDER — GELATIN ABSORBABLE 12-7 MM EX MISC
1.0000 | Freq: Once | CUTANEOUS | Status: AC
  Administered 2022-06-14: 1 via TOPICAL

## 2022-06-14 MED ORDER — FENTANYL CITRATE (PF) 100 MCG/2ML IJ SOLN
INTRAMUSCULAR | Status: AC | PRN
  Administered 2022-06-14: 50 ug via INTRAVENOUS

## 2022-06-14 MED ORDER — MIDAZOLAM HCL 2 MG/2ML IJ SOLN
INTRAMUSCULAR | Status: AC
  Filled 2022-06-14: qty 2

## 2022-06-14 NOTE — H&P (Addendum)
Chief Complaint: Patient was seen in consultation today for liver core biopsy at the request of Beavers,Kimberly  Referring Physician(s): Tressia Danas  Supervising Physician: Simonne Come  Patient Status: [facility] - Out[facility]  History of Present Illness: Alexandra Taylor is a 72 y.o. female   Pt aware of elevated liver functions for ~1-2 months Denies symptoms Routine blood work showing abnormality Follows with Dr Orvan Falconer Scheduled today for liver core biopsy   Past Medical History:  Diagnosis Date   Hyperlipidemia    Hypertension    Osteopenia     Past Surgical History:  Procedure Laterality Date   hemithyroidectomy      Allergies: Patient has no known allergies.  Medications: Prior to Admission medications   Medication Sig Start Date End Date Taking? Authorizing Provider  b complex vitamins tablet Take 1 tablet by mouth daily.    [provider]  BIOTIN 5000 PO Take by mouth.    [provider]  buPROPion (WELLBUTRIN XL) 150 MG 24 hr tablet TAKE 1 TABLET BY MOUTH EVERY DAY 05/12/22   Margaree Mackintosh, MD  fish oil-omega-3 fatty acids 1000 MG capsule Take 2 g by mouth daily.    [provider]  FLUoxetine (PROZAC) 20 MG capsule TAKE 2 CAPSULES BY MOUTH EVERY DAY 05/11/22   Margaree Mackintosh, MD  fluticasone (FLONASE) 50 MCG/ACT nasal spray Place 1 spray into both nostrils as needed. 10/15/20   Margaree Mackintosh, MD  GLUCOSAMINE SULFATE PO Take by mouth daily.    [provider]  levothyroxine (SYNTHROID) 50 MCG tablet TAKE 1 TABLET BY MOUTH EVERY DAY BEFORE BREAKFAST 12/01/21   Margaree Mackintosh, MD  methylPREDNISolone (MEDROL) 4 MG tablet Take in tapering course as directed 6-5-4-3-2-1 06/06/22   Margaree Mackintosh, MD  ramipril (ALTACE) 10 MG capsule TAKE 1 CAPSULE BY MOUTH EVERY DAY 05/11/22   Margaree Mackintosh, MD     Family History  Problem Relation Age of Onset   Hypertension Mother    Colon cancer Brother     Social History    Socioeconomic History   Marital status: Married    Spouse name: Not on file   Number of children: Not on file   Years of education: Not on file   Highest education level: Not on file  Occupational History   Not on file  Tobacco Use   Smoking status: Former    Packs/day: 1.00    Years: 15.00    Additional pack years: 0.00    Total pack years: 15.00    Types: Cigarettes    Quit date: 02/28/1984    Years since quitting: 38.3   Smokeless tobacco: Never  Substance and Sexual Activity   Alcohol use: Yes    Comment: Daily   Drug use: No   Sexual activity: Yes  Other Topics Concern   Not on file  Social History Narrative   Not on file   Social Determinants of Health   Financial Resource Strain: Not on file  Food Insecurity: Not on file  Transportation Needs: Not on file  Physical Activity: Not on file  Stress: Not on file  Social Connections: Not on file    Review of Systems: A 12 point ROS discussed and pertinent positives are indicated in the HPI above.  All other systems are negative.  Review of Systems  Constitutional:  Negative for activity change, fatigue, fever and unexpected weight change.  Respiratory:  Negative for cough and shortness of breath.  Cardiovascular:  Negative for chest pain.  Gastrointestinal:  Negative for abdominal pain.  Neurological:  Negative for weakness.  Psychiatric/Behavioral:  Negative for behavioral problems and confusion.     Vital Signs: BP 123/79   Pulse 67   Temp 98.5 F (36.9 C) (Oral)   Resp 17   Ht  (1.6 m)   Wt 134 lb (60.8 kg)   SpO2 96%   BMI 23.74 kg/m    Physical Exam Vitals reviewed.  HENT:     Mouth/Throat:     Mouth: Mucous membranes are moist.  Cardiovascular:     Rate and Rhythm: Normal rate and regular rhythm.     Heart sounds: Normal heart sounds.  Pulmonary:     Effort: Pulmonary effort is normal.     Breath sounds: Normal breath sounds.  Abdominal:     Palpations: Abdomen is soft.   Musculoskeletal:        General: Normal range of motion.  Skin:    General: Skin is warm.  Neurological:     Mental Status: She is alert and oriented to person, place, and time.  Psychiatric:        Behavior: Behavior normal.     Imaging: DG Foot Complete Right  Result Date: 06/07/2022 CLINICAL DATA:  Pain right first metatarsophalangeal joint EXAM: RIGHT FOOT COMPLETE - 3+ VIEW COMPARISON:  03/16/2021 FINDINGS: No recent fracture or dislocation is seen. No focal lytic lesions are seen. Small bony spurs are seen in first metatarsophalangeal joint. There is mild hallux valgus deformity. Bony spurs are seen in multiple interphalangeal joints. Small plantar spur is seen in calcaneus. No significant interval changes are noted. IMPRESSION: No recent fracture or dislocation is seen. Degenerative changes with bony spurs are seen in first metatarsophalangeal joint and multiple interphalangeal joints. Small plantar spur is seen in calcaneus. Electronically Signed   By: Ernie Avena M.D.   On: 06/07/2022 14:55    Labs:  CBC: Recent Labs    10/11/21 0918 02/23/22 1400 03/15/22 1437 06/06/22 1611  WBC 3.3* 4.2 4.0 6.2  HGB 13.9 14.2 13.8 13.6  HCT 40.0 39.8 39.9 40.2  PLT 177 155 197 202    COAGS: No results for input(s): "INR", "APTT" in the last 8760 hours.  BMP: Recent Labs    10/11/21 0918 01/27/22 0850 02/23/22 1400 03/15/22 1437  NA 135  --  132* 136  K 5.2  --  3.9 4.5  CL 101  --  95* 105  CO2 24  --  27 25  GLUCOSE 84 81 101* 93  BUN 14  --  5* 12  CALCIUM 9.7  --  9.0 9.1  CREATININE 0.96  --  0.82 0.91  GFRNONAA  --   --  >60 >60    LIVER FUNCTION TESTS: Recent Labs    02/23/22 1400 03/03/22 1014 03/10/22 1332 03/15/22 1437 04/18/22 0933  BILITOT 0.6 0.6 0.8 0.6 0.7  AST 114* 47* 40* 40 37*  ALT 111* 57* 44* 40 36*  ALKPHOS 80 127* 106 109  --   PROT 6.5 6.0 6.3 6.7 6.5  ALBUMIN 4.1 3.8 4.0 3.9  --     TUMOR MARKERS: Recent Labs     01/27/22 0850  AFPTM 3.2    Assessment and Plan:  Elevated liver functions Asymptomatic Scheduled for liver core biopsy Risks and benefits of liver core biopsy was discussed with the patient and/or patient's family including, but not limited to bleeding, infection, damage to adjacent structures  or low yield requiring additional tests.  All of the questions were answered and there is agreement to proceed. Consent signed and in chart.  Thank you for this interesting consult.  I greatly enjoyed meeting ADYSON VANBUREN and look forward to participating in their care.  A copy of this report was sent to the requesting provider on this date.  Electronically Signed: Robet Leu, PA-C 06/14/2022, 12:25 PM   I spent a total of  30 Minutes   in face to face in clinical consultation, greater than 50% of which was counseling/coordinating care for liver core biopsy

## 2022-06-14 NOTE — Procedures (Signed)
Interventional Radiology Procedure Note  Procedure: US RANDOM RT LIVER CORE BX    Complications: None  Estimated Blood Loss:  MIN  Findings: 18 G CORE X 2    M. TREVOR Trang Bouse, MD    

## 2022-06-15 ENCOUNTER — Encounter: Payer: Self-pay | Admitting: Gastroenterology

## 2022-06-15 LAB — SURGICAL PATHOLOGY

## 2022-06-24 ENCOUNTER — Encounter: Payer: Self-pay | Admitting: Internal Medicine

## 2022-06-24 NOTE — Patient Instructions (Signed)
Take Medrol 4 mg tab as a tapering course as directed over 6 days.  Call with progress report in 24 to 48 hours or sooner if worse.  Please have x-ray of foot.  More likely to have gout and less likely to have stress fracture of the foot.  Uric acid is normal and white blood cell count is normal.  Bone density test ordered because patient is postmenopausal and would like to know if she has osteoporosis.

## 2022-06-26 NOTE — Progress Notes (Signed)
Patient Care Team: Margaree Mackintosh, MD as PCP - General (Internal Medicine)  Visit Date: 06/06/22  Subjective:    Patient ID: Alexandra Taylor , Female   DOB: 11/27/1950, 72 y.o.    MRN: [id number]   72 y.o. Female presents today for right foot pain since 05/06/22. Pain has worsened since 06/04/22.   Past Medical History:  Diagnosis Date   Hyperlipidemia    Hypertension    Osteopenia      Family History  Problem Relation Age of Onset   Hypertension Mother    Colon cancer Brother     Social History   Social History Narrative   Not on file      No known injury to foot. Having considerable pain. Hurts to touch foot.         Review of Systems  Constitutional:  Negative for fever and malaise/fatigue.  HENT:  Negative for congestion.   Eyes:  Negative for blurred vision.  Respiratory:  Negative for cough and shortness of breath.   Cardiovascular:  Negative for chest pain, palpitations and leg swelling.  Gastrointestinal:  Negative for vomiting.  Musculoskeletal:  Negative for back pain.       (+) Right first MTP joint pain  Skin:  Negative for rash.  Neurological:  Negative for loss of consciousness and headaches. \      Objective:   Vitals: BP 120/80   Pulse 70   Temp 98.8 F (37.1 C) (Tympanic)   Ht 5\' 3"  (1.6 m)   Wt 134 lb 4.8 oz (60.9 kg)   SpO2 97%   BMI 23.79 kg/m    Physical Exam Vitals and nursing note reviewed.  Constitutional:      General: She is not in acute distress.    Appearance: Normal appearance. She is not toxic-appearing.  HENT:     Head: Normocephalic and atraumatic.  Pulmonary:     Effort: Pulmonary effort is normal.  Musculoskeletal:     Comments: Right first MTP joint erythematous and tender to palpation.  Skin:    General: Skin is warm and dry.  Neurological:     Mental Status: She is alert and oriented to person, place, and time. Mental status is at baseline.  Psychiatric:        Mood and Affect: Mood normal.         Behavior: Behavior normal.        Thought Content: Thought content normal.        Judgment: Judgment normal.    Results:   Studies obtained and personally reviewed by me:    Labs:       Component Value Date/Time   NA 136 03/15/2022 1437   K 4.5 03/15/2022 1437   CL 105 03/15/2022 1437   CO2 25 03/15/2022 1437   GLUCOSE 93 03/15/2022 1437   BUN 12 03/15/2022 1437   CREATININE 0.91 03/15/2022 1437   CREATININE 0.96 10/11/2021 0918   CALCIUM 9.1 03/15/2022 1437   PROT 6.5 04/18/2022 0933   ALBUMIN 3.9 03/15/2022 1437   AST 37 (H) 04/18/2022 0933   AST 40 03/15/2022 1437   ALT 36 (H) 04/18/2022 0933   ALT 40 03/15/2022 1437   ALKPHOS 109 03/15/2022 1437   BILITOT 0.7 04/18/2022 0933   BILITOT 0.6 03/15/2022 1437   GFRNONAA >60 03/15/2022 1437   GFRNONAA 85 01/01/2020 1656   GFRAA 98 01/01/2020 1656     Lab Results  Component Value Date   WBC 4.2  06/14/2022   HGB 13.9 06/14/2022   HCT 39.1 06/14/2022   MCV 93.3 06/14/2022   PLT 208 06/14/2022    Lab Results  Component Value Date   CHOL 186 04/18/2022   HDL 95 04/18/2022   LDLCALC 83 04/18/2022   TRIG 27 04/18/2022   CHOLHDL 2.0 04/18/2022    No results found for: "HGBA1C"   Lab Results  Component Value Date   TSH 1.45 04/18/2022      Assessment & Plan:   Gout right first MTP joint: ordered serum uric acid, CBC with Diff/Plat, X-ray right foot. Prescribed Medrol dosepak tapering course take as directed.Pt. is to call with progress report in 24-48 hours or sooner if worse. Less likely to have stress fracture of foot. Have ordered Xray of foot.   Post menopausal- ordered bone density test   WBC is normal. Uric acid is normal.   I,Alexander Ruley,acting as a scribe for Margaree Mackintosh, MD.,have documented all relevant documentation on the behalf of Margaree Mackintosh, MD,as directed by  Margaree Mackintosh, MD while in the presence of Margaree Mackintosh, MD.   I, Margaree Mackintosh, MD, have reviewed all documentation  for this visit. The documentation on 06/24/22 for the exam, diagnosis, procedures, and orders are all accurate and complete

## 2022-08-06 ENCOUNTER — Other Ambulatory Visit: Payer: Self-pay | Admitting: Internal Medicine

## 2022-08-22 ENCOUNTER — Other Ambulatory Visit: Payer: Self-pay | Admitting: Internal Medicine

## 2022-09-06 NOTE — Progress Notes (Signed)
09/08/2022 Alexandra Taylor [id number] 1951/01/20  Referring provider: Margaree Mackintosh, MD Primary GI doctor: Dr. Chales Abrahams (Dr. Orvan Falconer)  ASSESSMENT AND PLAN:   72 year old female with history of alcohol use significant presents for follow-up of hemosiderosis/AB Liver US/MRI 10/24/2021 right upper quadrant ultrasound echogenic liver with nodular contour possible cirrhosis Ferritin is been less than 500, iron levels are normal, FHT testing negative 02/03/2022 MRI left signal liver compatible with iron deposition no findings suggestive of cirrhosis numerous nodular areas T2 signal T1 early arterial phase therapy iron deposition suggested repeat MRI 3 months using Eovist 06/14/2022 liver biopsy showed preserved lobular architecture, no significant bile duct injury no evidence of hepatocellular injury or fibrosis.  Iron stains hepatocellular 3+ iron.  Overall clinically indicative of resolved liver injury possible etiology infection medication toxin or supplement.  -We are waiting for hepatic iron index to help rule in favor of secondary hemochromatosis with negative HFE however I discussed with pathologist and they are uncertain where to send tissue.  I will discuss with the physician to see if they are aware of where we can refer for liver specialty pathology -Will repeat CBC, c-Met, INR, and iron/ferritin -Patient has continued to drink but has cut back significantly from daily to 2 days a week, continue to encourage patient to stop drinking.  Possibly related to alcoholic hepatitis/cirrhosis -Will repeat MRI using Eovist to evaluate numerous nodular areas -Liver biopsy showed no fibrosis, continue to calculate fib 4, consider elf -May want to consider referral to hepatology  Alcohol use Encourage patient to continue to cut back on alcohol as this could significantly affect her liver.  Recall colonoscopy 11/2022 with Dr. Chales Abrahams  Patient Care Team: Lenord Fellers Luanna Cole, MD as PCP - General  (Internal Medicine)  HISTORY OF PRESENT ILLNESS: 71 y.o. female with a past medical history of hypertension, hypothyroidism, attention deficit disorder, anxiety and others listed below presents for evaluation of liver biopsy.  Previous patient of Dr. Orvan Falconer with history of iron deposition in liver, spleen, no splenic or bone marrow reticuloendothelial iron deposition.  Ferritin less than 500, iron levels normal, HFE testing negative.  12/11/2017 colonoscopy Dr. Pati Gallo sigmoid diverticulosis otherwise normal TI, repeat 5 years due to family history (11/2022) 10/24/2021 RUQ US echogenic liver with nodular contour suggestive of chronic liver disease possible cirrhosis Labs 01/27/22 ferritin 353, iron 168 Labs 03/15/22 ferritin 290, iron 96 02/03/2022 MRI showed heterogeneous left signal liver NVC [place] compatible with iron deposition liver, no findings suggestive of cirrhosis, numerous nodular areas T2 signal and relative increased T1 early arterial phase favored to be iron deposition sparing repeat MRI liver 3 months using Eovist contrast material to reassess these areas. Mild fusiform dilatation of the CBD measures up to 8 mm. No signs of choledocholithiasis. Signs of iron deposition within the spleen are also identified. 03/15/2022 HFE DNA mutation negative 05/30/2022 office visit with Dr. Orvan Falconer for abnormal liver US. Was drinking more alcohol during COVID,  was drinking wine daily, 4 glasses.  Long as she has been able to be sober 6 months, uses alcohol to treat insomnia and for social interactions.   No family history of liver disease, autoimmune hepatitis.  06/14/2022 liver biopsy Pathology showed preserved lobular architecture, no significant bile duct injury, or evidence of hepatocellular injury, no evidence of fibrosis.  Iron stain shows hepatocellular 3+ iron deposit, PAS/D highlights ceroid laden macrophages without evidence of diagnostic cytoplasmic globules. Reticulin stain reveals intact  reticulin framework.  Overall clinical features indicative of  resolved liver injury, possible etiology infection, medication, toxin, supplement.  She states she has cut back significantly to wine on the weekends only, 3 glasses a night for 2 night a week.  She was taking fish oil for arthritis but stopped this, no other supplements other than biotin, B complex, and glucosamine.  She does not smoke, no drug use.   She  reports that she quit smoking about 38 years ago. Her smoking use included cigarettes. She started smoking about 53 years ago. She has a 15 pack-year smoking history. She has never used smokeless tobacco. She reports current alcohol use. She reports that she does not use drugs.  Evaluation with an ultrasound 10/24/2021 showed an echogenic liver with nodular contours.  The sonographic appearance suggest chronic liver disease and possible cirrhosis.  No mass seen.  The radiologist recommended an MRI.  No further abdominal imaging has been performed.   Labs 10/11/2021: Sodium 135, creatinine 0.96, total bilirubin 0.9, AST 46, ALT 34, alk phos 119, hemoglobin 13.9, platelets 17 Labs 01/27/22 ferritin 353, iron 168 Labs 03/15/22 ferritin 290, iron 96 Labs 03/15/22 HFE DNA mutation analysis negative   MRI 02/03/22: IMPRESSION: 1. There is heterogeneous loss of signal within the liver on the inphase sequences. Imaging findings are compatible with iron deposition disease within the liver. No specific findings identified to suggest cirrhosis. 2. Numerous nodular areas of relative increased T2 signal and relative increased T1 signal on the early arterial phase images of the liver. These are favored to represent areas of iron deposition sparing. Recommend follow-up imaging in 3 months with repeat contrast enhanced MRI of the liver using Eovist contrast material to reassess these areas. 3. Mild fusiform dilatation of the CBD measures up to 8 mm. No signs of choledocholithiasis. 4. Signs of iron  deposition within the spleen are also identified.   Ultrasound with Elastography Median kPa: 2.9 IQR: 0.4 IQR/Median kPa ratio: 0.14 Data quality:  Good Diagnostic category:  < or = 5 kPa: high probability of being normal   CT abd/pelvis with contrast 02/23/22: No acute abnormality in the abdomen or pelvis. Cholelithiasis. Colonic diverticulosis without diverticulitis.   MRI 05/06/1932 with and without Eovist showed changes in the liver consistent with iron deposition.  There is no evidence for splenic or marrow reticuloendothelial iron deposition on this exam.   Endoscopic history: - Colonoscopy with Dr. Noe Gens in 2009 may have revealed a polyp (results not available to me).  - Colonoscopy 12/11/2017 with Dr. Marca Ancona showed sigmoid diverticulosis and was otherwise normal to the terminal ileum.  Surveillance recommended in 5 years given the family history.  RELEVANT LABS AND IMAGING: CBC    Component Value Date/Time   WBC 4.2 06/14/2022 1238   RBC 4.19 06/14/2022 1238   HGB 13.9 06/14/2022 1238   HGB 13.8 03/15/2022 1437   HCT 39.1 06/14/2022 1238   PLT 208 06/14/2022 1238   PLT 197 03/15/2022 1437   MCV 93.3 06/14/2022 1238   MCH 33.2 06/14/2022 1238   MCHC 35.5 06/14/2022 1238   RDW 13.2 06/14/2022 1238   LYMPHSABS 1,581 06/06/2022 1611   MONOABS 0.4 03/15/2022 1437   EOSABS 93 06/06/2022 1611   BASOSABS 37 06/06/2022 1611   Recent Labs    10/11/21 0918 02/23/22 1400 03/15/22 1437 06/06/22 1611 06/14/22 1238  HGB 13.9 14.2 13.8 13.6 13.9    CMP     Component Value Date/Time   NA 136 03/15/2022 1437   K 4.5 03/15/2022 1437   CL 105  03/15/2022 1437   CO2 25 03/15/2022 1437   GLUCOSE 93 03/15/2022 1437   BUN 12 03/15/2022 1437   CREATININE 0.91 03/15/2022 1437   CREATININE 0.96 10/11/2021 0918   CALCIUM 9.1 03/15/2022 1437   PROT 6.5 04/18/2022 0933   ALBUMIN 3.9 03/15/2022 1437   AST 37 (H) 04/18/2022 0933   AST 40 03/15/2022 1437   ALT 36 (H) 04/18/2022  0933   ALT 40 03/15/2022 1437   ALKPHOS 109 03/15/2022 1437   BILITOT 0.7 04/18/2022 0933   BILITOT 0.6 03/15/2022 1437   GFRNONAA >60 03/15/2022 1437   GFRNONAA 85 01/01/2020 1656   GFRAA 98 01/01/2020 1656      Latest Ref Rng & Units 04/18/2022    9:33 AM 03/15/2022    2:37 PM 03/10/2022    1:32 PM  Hepatic Function  Total Protein 6.1 - 8.1 g/dL 6.5  6.7  6.3   Albumin 3.5 - 5.0 g/dL  3.9  4.0   AST 10 - 35 U/L 37  40  40   ALT 6 - 29 U/L 36  40  44   Alk Phosphatase 38 - 126 U/L  109  106   Total Bilirubin 0.2 - 1.2 mg/dL 0.7  0.6  0.8   Bilirubin, Direct 0.0 - 0.2 mg/dL 0.2   0.2       Latest Ref Rng & Units 01/27/2022    8:50 AM  Hepatitis C  AFP ng/mL 3.2     Current Medications:   Current Outpatient Medications (Endocrine & Metabolic):    levothyroxine (SYNTHROID) 50 MCG tablet, TAKE 1 TABLET BY MOUTH EVERY DAY BEFORE BREAKFAST  Current Outpatient Medications (Cardiovascular):    ramipril (ALTACE) 10 MG capsule, TAKE 1 CAPSULE BY MOUTH EVERY DAY  Current Outpatient Medications (Respiratory):    fluticasone (FLONASE) 50 MCG/ACT nasal spray, Place 1 spray into both nostrils as needed.    Current Outpatient Medications (Other):    b complex vitamins tablet, Take 1 tablet by mouth daily.   BIOTIN 5000 PO, Take by mouth.   buPROPion (WELLBUTRIN XL) 150 MG 24 hr tablet, TAKE 1 TABLET BY MOUTH EVERY DAY   FLUoxetine (PROZAC) 20 MG capsule, TAKE 2 CAPSULES BY MOUTH EVERY DAY   GLUCOSAMINE SULFATE PO, Take by mouth daily.  Medical History:  Past Medical History:  Diagnosis Date   Hyperlipidemia    Hypertension    Osteopenia    Allergies: No Known Allergies   Surgical History:  She  has a past surgical history that includes hemithyroidectomy. Family History:  Her family history includes Colon cancer in her brother; Hypertension in her mother.  REVIEW OF SYSTEMS  : All other systems reviewed and negative except where noted in the History of Present  Illness.  PHYSICAL EXAM: BP 118/70   Pulse 66   Ht 5\' 3"  (1.6 m)   Wt 135 lb 8 oz (61.5 kg)   SpO2 98%   BMI 24.00 kg/m  General Appearance: Well nourished, in no apparent distress. Head:   Normocephalic and atraumatic. Eyes:  sclerae anicteric,conjunctive pink  Respiratory: Respiratory effort normal, BS equal bilaterally without rales, rhonchi, wheezing. Cardio: RRR with no MRGs. Peripheral pulses intact.  Abdomen: Soft,  Obese ,active bowel sounds. No tenderness . Without guarding and Without rebound. No masses. Rectal: Not evaluated Musculoskeletal: Full ROM, Normal gait. Without edema. Skin:  Dry and intact without significant lesions or rashes Neuro: Alert and  oriented x4;  No focal deficits. Psych:  Cooperative. Normal  mood and affect.    Doree Albee, PA-C 11:44 AM

## 2022-09-06 NOTE — Telephone Encounter (Signed)
Do get repeat MRI liver No alcohol or any hepatotoxic meds Also get Fib-4 index RG

## 2022-09-08 ENCOUNTER — Other Ambulatory Visit (INDEPENDENT_AMBULATORY_CARE_PROVIDER_SITE_OTHER): Payer: PPO

## 2022-09-08 ENCOUNTER — Encounter: Payer: Self-pay | Admitting: Physician Assistant

## 2022-09-08 ENCOUNTER — Ambulatory Visit: Payer: PPO | Admitting: Physician Assistant

## 2022-09-08 VITALS — BP 118/70 | HR 66 | Ht 63.0 in | Wt 135.5 lb

## 2022-09-08 DIAGNOSIS — R748 Abnormal levels of other serum enzymes: Secondary | ICD-10-CM

## 2022-09-08 DIAGNOSIS — R935 Abnormal findings on diagnostic imaging of other abdominal regions, including retroperitoneum: Secondary | ICD-10-CM

## 2022-09-08 DIAGNOSIS — Z800 Family history of malignant neoplasm of digestive organs: Secondary | ICD-10-CM | POA: Diagnosis not present

## 2022-09-08 LAB — IBC + FERRITIN
Ferritin: 320.7 ng/mL — ABNORMAL HIGH (ref 10.0–291.0)
Iron: 178 ug/dL — ABNORMAL HIGH (ref 42–145)
Saturation Ratios: 65.5 % — ABNORMAL HIGH (ref 20.0–50.0)
TIBC: 271.6 ug/dL (ref 250.0–450.0)
Transferrin: 194 mg/dL — ABNORMAL LOW (ref 212.0–360.0)

## 2022-09-08 LAB — BASIC METABOLIC PANEL
BUN: 15 mg/dL (ref 6–23)
CO2: 30 mEq/L (ref 19–32)
Calcium: 9.8 mg/dL (ref 8.4–10.5)
Chloride: 100 mEq/L (ref 96–112)
Creatinine, Ser: 0.89 mg/dL (ref 0.40–1.20)
GFR: 65.36 mL/min (ref >60.00)
Glucose, Bld: 82 mg/dL (ref 70–99)
Potassium: 4.9 mEq/L (ref 3.5–5.1)
Sodium: 137 mEq/L (ref 135–145)

## 2022-09-08 LAB — HEPATIC FUNCTION PANEL
ALT: 24 U/L (ref 0–35)
AST: 32 U/L (ref 0–37)
Albumin: 4.3 g/dL (ref 3.5–5.2)
Alkaline Phosphatase: 144 U/L — ABNORMAL HIGH (ref 39–117)
Bilirubin, Direct: 0.2 mg/dL (ref 0.0–0.3)
Total Bilirubin: 0.8 mg/dL (ref 0.2–1.2)
Total Protein: 7 g/dL (ref 6.0–8.3)

## 2022-09-08 LAB — CBC WITH DIFFERENTIAL/PLATELET
Basophils Absolute: 0 10*3/uL (ref 0.0–0.1)
Basophils Relative: 1.2 % (ref 0.0–3.0)
Eosinophils Absolute: 0.1 10*3/uL (ref 0.0–0.7)
Eosinophils Relative: 3.9 % (ref 0.0–5.0)
HCT: 43.9 % (ref 36.0–46.0)
Hemoglobin: 14.4 g/dL (ref 12.0–15.0)
Lymphocytes Relative: 33.3 % (ref 12.0–46.0)
Lymphs Abs: 1.2 10*3/uL (ref 0.7–4.0)
MCHC: 32.9 g/dL (ref 30.0–36.0)
MCV: 98.2 fl (ref 78.0–100.0)
Monocytes Absolute: 0.3 10*3/uL (ref 0.1–1.0)
Monocytes Relative: 10 % (ref 3.0–12.0)
Neutro Abs: 1.8 10*3/uL (ref 1.4–7.7)
Neutrophils Relative %: 51.6 % (ref 43.0–77.0)
Platelets: 191 10*3/uL (ref 150.0–400.0)
RBC: 4.47 Mil/uL (ref 3.87–5.11)
RDW: 14.4 % (ref 11.5–15.5)
WBC: 3.5 10*3/uL — ABNORMAL LOW (ref 4.0–10.5)

## 2022-09-08 LAB — PROTIME-INR
INR: 1.1 ratio — ABNORMAL HIGH (ref 0.8–1.0)
Prothrombin Time: 11.5 s (ref 9.6–13.1)

## 2022-09-08 NOTE — Patient Instructions (Signed)
You have been scheduled for an MRI at [facility] long  on 09/18/2022. Your appointment time is 6:30pm. Please arrive to admitting (at main entrance of the hospital) 30 minutes prior to your appointment time for registration purposes. Please make certain not to have anything to eat or drink 6 hours prior to your test. In addition, if you have any metal in your body, have a pacemaker or defibrillator, please be sure to let your ordering physician know. This test typically takes 45 minutes to 1 hour to complete. Should you need to reschedule, please call [phone] to do so.  Your provider has requested that you go to the basement level for lab work before leaving today. Press "B" on the elevator. The lab is located at the first door on the left as you exit the elevator.  _______________________________________________________  If your blood pressure at your visit was 140/90 or greater, please contact your primary care physician to follow up on this.  _______________________________________________________  If you are age 58 or older, your body mass index should be between 23-30. Your Body mass index is 24 kg/m. If this is out of the aforementioned range listed, please consider follow up with your Primary Care Provider.  If you are age 99 or younger, your body mass index should be between 19-25. Your Body mass index is 24 kg/m. If this is out of the aformentioned range listed, please consider follow up with your Primary Care Provider.   ________________________________________________________  The [place] [facility] providers would like to encourage you to use [facility] to communicate with providers for non-urgent requests or questions.  Due to long hold times on the telephone, sending your provider a message by [facility] may be a faster and more efficient way to get a response.  Please allow 48 business hours for a response.  Please remember that this is for non-urgent requests.   _______________________________________________________ It was a pleasure to see you today!  Thank you for trusting me with your gastrointestinal care!

## 2022-09-18 ENCOUNTER — Encounter (HOSPITAL_COMMUNITY): Payer: Self-pay

## 2022-09-18 ENCOUNTER — Ambulatory Visit (HOSPITAL_COMMUNITY): Admission: RE | Admit: 2022-09-18 | Payer: PPO | Source: Ambulatory Visit

## 2022-10-04 ENCOUNTER — Ambulatory Visit (HOSPITAL_COMMUNITY)
Admission: RE | Admit: 2022-10-04 | Discharge: 2022-10-04 | Disposition: A | Discharge status: Home / Self Care | Payer: PPO | Source: Ambulatory Visit | Attending: Physician Assistant | Admitting: Physician Assistant

## 2022-10-04 DIAGNOSIS — R935 Abnormal findings on diagnostic imaging of other abdominal regions, including retroperitoneum: Secondary | ICD-10-CM | POA: Insufficient documentation

## 2022-10-04 DIAGNOSIS — R748 Abnormal levels of other serum enzymes: Secondary | ICD-10-CM | POA: Diagnosis not present

## 2022-10-04 DIAGNOSIS — K838 Other specified diseases of biliary tract: Secondary | ICD-10-CM | POA: Diagnosis not present

## 2022-10-04 MED ORDER — GADOXETATE DISODIUM 0.25 MMOL/ML IV SOLN
6.0000 mL | Freq: Once | INTRAVENOUS | Status: AC | PRN
  Administered 2022-10-04: 6 mL via INTRAVENOUS

## 2022-10-09 ENCOUNTER — Telehealth: Payer: Self-pay | Admitting: Internal Medicine

## 2022-10-09 ENCOUNTER — Encounter: Payer: Self-pay | Admitting: Internal Medicine

## 2022-10-09 ENCOUNTER — Telehealth (INDEPENDENT_AMBULATORY_CARE_PROVIDER_SITE_OTHER): Payer: PPO | Admitting: Internal Medicine

## 2022-10-09 VITALS — Ht 63.0 in | Wt 135.0 lb

## 2022-10-09 DIAGNOSIS — Z8639 Personal history of other endocrine, nutritional and metabolic disease: Secondary | ICD-10-CM

## 2022-10-09 DIAGNOSIS — U071 COVID-19: Secondary | ICD-10-CM

## 2022-10-09 DIAGNOSIS — I10 Essential (primary) hypertension: Secondary | ICD-10-CM

## 2022-10-09 DIAGNOSIS — H903 Sensorineural hearing loss, bilateral: Secondary | ICD-10-CM

## 2022-10-09 NOTE — Progress Notes (Signed)
Patient Care Team: Margaree Mackintosh, MD as PCP - General (Internal Medicine)  I connected with Alexandra Taylor on 10/09/22 at 5:08 PM by video enabled telemedicine visit and verified that I am speaking with the correct person using two identifiers.   I discussed the limitations, risks, security and privacy concerns of performing an evaluation and management service by telemedicine and the availability of in-person appointments. I also discussed with the patient that there may be a patient responsible charge related to this service. The patient expressed understanding and agreed to proceed.   Other persons participating in the visit and their role in the encounter: Medical scribe, Carlena Bjornstad  Patient's location: Home  Provider's location: Clinic   I provided 5 minutes of face-to-face video visit time during this encounter, and > 50% was spent counseling as documented under my assessment & plan.   Chief Complaint: Acute COVID-19 infection   Subjective:    Patient ID: Alexandra Taylor , Female    DOB: 01/26/1951, 72 y.o.    MRN: [id number]   72 y.o. Female presents today for video visit for acute COVID-19 infection.   Onset of her symptoms was last Thursday, including a dry severe cough, rhinorrhea, aching pains, and headaches. She felt very hot, but denies chills, vomiting, and diarrhea. She was febrile up to 99 degrees a couple days ago.   Today she is feeing better. Her temperature is 98.1 degrees; she is afebrile. Her cough has improved but is now associated with some sputum production.   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 Manifested  initially as elevated LFTs thought to be due to alcohol consumption but later found to have hemosiderosis and subtle evidence of hepatic iron deposition on MRI. Has mild common bile duct dilatation which is being followed by GI.She does have asymptomatic cholelithiasis.  She has HTN, Hyperlipidemia and osteopenia.   Past Medical  History:  Diagnosis Date   Hyperlipidemia    Hypertension    Osteopenia      Family History  Problem Relation Age of Onset   Hypertension Mother    Colon cancer Brother    Rectal cancer Neg Hx    Stomach cancer Neg Hx    Esophageal cancer Neg Hx     Social Hx: reviewed and unchanged. Previously worked in [employer]. Now retired. Does not smoke. Enjoys reading. Hx of benign neutropenia and peripheral smear reviews I the past have been negative.  Had RSV November 2021 and Covid-15 June 2020   Review of Systems  Constitutional:  Negative for chills, fever and malaise/fatigue.  HENT:  Negative for congestion.   Eyes:  Negative for blurred vision.  Respiratory:  Positive for cough and sputum production. Negative for shortness of breath.   Cardiovascular:  Negative for chest pain, palpitations and leg swelling.  Gastrointestinal:  Negative for vomiting.  Musculoskeletal:  Negative for back pain.  Skin:  Negative for rash.  Neurological:  Negative for loss of consciousness and headaches.        Objective:   Vitals: Ht 5\' 3"  (1.6 m)   Wt 135 lb (61.2 kg)   BMI 23.91 kg/m    Physical Exam    Results:   Studies obtained and personally reviewed by me:   Labs:       Component Value Date/Time   NA 137 09/08/2022 1146   K 4.9 09/08/2022 1146   CL 100 09/08/2022 1146   CO2 30 09/08/2022 1146   GLUCOSE 82 09/08/2022  1146   BUN 15 09/08/2022 1146   CREATININE 0.89 09/08/2022 1146   CREATININE 0.91 03/15/2022 1437   CREATININE 0.96 10/11/2021 0918   CALCIUM 9.8 09/08/2022 1146   PROT 7.0 09/08/2022 1146   ALBUMIN 4.3 09/08/2022 1146   AST 32 09/08/2022 1146   AST 40 03/15/2022 1437   ALT 24 09/08/2022 1146   ALT 40 03/15/2022 1437   ALKPHOS 144 (H) 09/08/2022 1146   BILITOT 0.8 09/08/2022 1146   BILITOT 0.6 03/15/2022 1437   GFRNONAA >60 03/15/2022 1437   GFRNONAA 85 01/01/2020 1656   GFRAA 98 01/01/2020 1656     Lab Results  Component Value Date   WBC 3.5  (L) 09/08/2022   HGB 14.4 09/08/2022   HCT 43.9 09/08/2022   MCV 98.2 09/08/2022   PLT 191.0 09/08/2022    Lab Results  Component Value Date   CHOL 186 04/18/2022   HDL 95 04/18/2022   LDLCALC 83 04/18/2022   TRIG 27 04/18/2022   CHOLHDL 2.0 04/18/2022    No results found for: "HGBA1C"   Lab Results  Component Value Date   TSH 1.45 04/18/2022       Assessment & Plan:   Acute COVID-19 viral infection - She has been feeling better today. She has Mucinex at home which works well for her. Offered antibiotics and Hycodan therapy which she declines. She is to stay well hydrated and walk some to prevent atelectasis. Quarantine for 5 days. She will let us know if her symptoms worsen or she begins feeling short of breath.  Vaccine Counseling - She will plan to receive a Covid booster this Fall.  Time spent reviewing chart with complex liver history review, interviewing patient and medical decision making is 20 minutes.   I,Mathew Stumpf,acting as a Neurosurgeon for Margaree Mackintosh, MD.,have documented all relevant documentation on the behalf of Margaree Mackintosh, MD,as directed by  Margaree Mackintosh, MD while in the presence of Margaree Mackintosh, MD.   I, Margaree Mackintosh, MD, have reviewed all documentation for this visit. The documentation on 10/28/22 for the exam, diagnosis, procedures, and orders are all accurate and complete.

## 2022-10-09 NOTE — Telephone Encounter (Signed)
scheduled

## 2022-10-09 NOTE — Telephone Encounter (Signed)
Alexandra Taylor [phone]  [name] called to say she tested positive for COVID on Thursday would like to talk to you and see if she should start taking Paxlovid now. Do you want me to schedule her for video visit at 4:15

## 2022-10-09 NOTE — Telephone Encounter (Signed)
LVM for patient that I was scheduling appointment

## 2022-10-12 DIAGNOSIS — M154 Erosive (osteo)arthritis: Secondary | ICD-10-CM | POA: Diagnosis not present

## 2022-10-12 DIAGNOSIS — L409 Psoriasis, unspecified: Secondary | ICD-10-CM | POA: Diagnosis not present

## 2022-10-12 DIAGNOSIS — Z6823 Body mass index (BMI) 23.0-23.9, adult: Secondary | ICD-10-CM | POA: Diagnosis not present

## 2022-10-12 DIAGNOSIS — M79645 Pain in left finger(s): Secondary | ICD-10-CM | POA: Diagnosis not present

## 2022-10-19 ENCOUNTER — Telehealth: Payer: Self-pay

## 2022-10-19 NOTE — Telephone Encounter (Signed)
Patient called she said she had a scan of her head and a calcium deposit was noted in her carotid artery. She would like an appointment to discuss what to do next.

## 2022-10-19 NOTE — Telephone Encounter (Signed)
scheduled

## 2022-10-26 NOTE — Progress Notes (Signed)
Patient Care Team: Margaree Mackintosh, MD as PCP - General (Internal Medicine)  Visit Date: 11/02/22  Subjective:    Patient ID: Alexandra Taylor , Female   DOB: 11-Jun-1950, 72 y.o.    MRN: [id number]   72 y.o. Female presents today to discuss recent TMJ imaging done through her dentist, Dr. Toni Arthurs. She had imaging of the TM joints that showed flat condyles bilaterally, extensive osteoarthritic changes in both TM joints, calcifications of left common carotid artery at C3-4. Lipid panel in 2/24 normal and she does not take lipid lowering medication. She is going to receive PRP injections in her TM joints. TMJ pain triggers headaches.  Past Medical History:  Diagnosis Date   Hyperlipidemia    Hypertension    Osteopenia      Family History  Problem Relation Age of Onset   Hypertension Mother    Colon cancer Brother    Rectal cancer Neg Hx    Stomach cancer Neg Hx    Esophageal cancer Neg Hx   Family history: Mother with history of hypertension, stroke, MI, fractured hip and anemia.  Father died at age 78 with history of dementia and had arthritis of his hands.  Brother with history of colon cancer in his 31s.  Social Hx: Married, non-smoker.  Dr. Crissie Reese evaluated her for neck pain April 2019.  An MRI was done and findings were consistent with osteoarthritis.  She is due for surveillance colonoscopy October 2024.  Last colonoscopy was done 2019 at [facility] but she is now under the care of [facility].  Liver ultrasound done by Dr. Orvan Falconer had suggested possible cirrhosis.  She had MR with with contrast of the liver in August due to history of abnormal liver enzymes showing subtle evidence of hepatic iron deposition.  No suspicious liver lesions.  Unchanged minimal common bile duct dilatation measuring up to 0.9 cm without calculus or obstruction identified to the ampulla.   Review of Systems  Constitutional:  Negative for fever and malaise/fatigue.  HENT:  Negative for  congestion.   Eyes:  Negative for blurred vision.  Respiratory:  Negative for cough and shortness of breath.   Cardiovascular:  Negative for chest pain, palpitations and leg swelling.  Gastrointestinal:  Negative for vomiting.  Musculoskeletal:  Positive for joint pain (Bilateral TMJ). Negative for back pain.  Skin:  Negative for rash.  Neurological:  Positive for headaches. Negative for loss of consciousness.        Objective:   Vitals: BP 118/78 (BP Location: Right Arm, Patient Position: Sitting, Cuff Size: Large)   Pulse 69   Temp 98.6 F (37 C) (Oral)   Ht 5\' 3"  (1.6 m)   Wt 135 lb (61.2 kg)   SpO2 99%   BMI 23.91 kg/m    Physical Exam Vitals and nursing note reviewed.  Constitutional:      General: She is not in acute distress.    Appearance: Normal appearance. She is not toxic-appearing.  HENT:     Head: Normocephalic and atraumatic.  Pulmonary:     Effort: Pulmonary effort is normal.  Skin:    General: Skin is warm and dry.  Neurological:     Mental Status: She is alert and oriented to person, place, and time. Mental status is at baseline.  Psychiatric:        Mood and Affect: Mood normal.        Behavior: Behavior normal.        Thought Content:  Thought content normal.        Judgment: Judgment normal.       Results:   Studies obtained and personally reviewed by me:  She had imaging of the TM joints that showed flat condyles bilaterally, extensive osteoarthritic changes in both TM joints, calcifications of left common carotid artery at C3-4.   Labs:       Component Value Date/Time   NA 137 09/08/2022 1146   K 4.9 09/08/2022 1146   CL 100 09/08/2022 1146   CO2 30 09/08/2022 1146   GLUCOSE 82 09/08/2022 1146   BUN 15 09/08/2022 1146   CREATININE 0.89 09/08/2022 1146   CREATININE 0.91 03/15/2022 1437   CREATININE 0.96 10/11/2021 0918   CALCIUM 9.8 09/08/2022 1146   PROT 7.0 09/08/2022 1146   ALBUMIN 4.3 09/08/2022 1146   AST 32 09/08/2022 1146    AST 40 03/15/2022 1437   ALT 24 09/08/2022 1146   ALT 40 03/15/2022 1437   ALKPHOS 144 (H) 09/08/2022 1146   BILITOT 0.8 09/08/2022 1146   BILITOT 0.6 03/15/2022 1437   GFRNONAA >60 03/15/2022 1437   GFRNONAA 85 01/01/2020 1656   GFRAA 98 01/01/2020 1656     Lab Results  Component Value Date   WBC 3.5 (L) 09/08/2022   HGB 14.4 09/08/2022   HCT 43.9 09/08/2022   MCV 98.2 09/08/2022   PLT 191.0 09/08/2022    Lab Results  Component Value Date   CHOL 186 04/18/2022   HDL 95 04/18/2022   LDLCALC 83 04/18/2022   TRIG 27 04/18/2022   CHOLHDL 2.0 04/18/2022    No results found for: "HGBA1C"   Lab Results  Component Value Date   TSH 1.45 04/18/2022      Assessment & Plan:   Osteoarthritis Bilateral TM Joints: Patient says she may well need to have surgery to correct this problem.  Coronary artery calcifications noted on recent evaluation for TM joints by dentist.  Today we ordered bilateral carotid duplex study and placed order for coronary calcium score.  Patient is going on vacation and will have the studies when convenient in the near future.  Then, we will contact her with these results.  She has no carotid bruits.  Minimal dilated common bile duct being followed by GI measuring up to 0.9 cm without obstruction or calculus  Subtle evidence of iron deposition in liver  I,Alexander Ruley,acting as a scribe for Margaree Mackintosh, MD.,have documented all relevant documentation on the behalf of Margaree Mackintosh, MD,as directed by  Margaree Mackintosh, MD while in the presence of Margaree Mackintosh, MD.   I, Margaree Mackintosh, MD, have reviewed all documentation for this visit. The documentation on 11/03/22 for the exam, diagnosis, procedures, and orders are all accurate and complete.

## 2022-10-28 ENCOUNTER — Encounter: Payer: Self-pay | Admitting: Internal Medicine

## 2022-10-28 NOTE — Patient Instructions (Signed)
We are sorry you are not feeling well.  Please rest at home and quarantine for 5 days.  Antibiotics and the Hycodan cough syrup and been declined by patient.  She says she does not feel all that badly but will need to seek advice.  She will stay well-hydrated and walk some to prevent atelectasis of the lungs.  Advised her to plan to get COVID booster with new variant later this coming Fall.

## 2022-10-31 ENCOUNTER — Other Ambulatory Visit: Payer: PPO

## 2022-11-02 ENCOUNTER — Encounter: Payer: Self-pay | Admitting: Internal Medicine

## 2022-11-02 ENCOUNTER — Ambulatory Visit (INDEPENDENT_AMBULATORY_CARE_PROVIDER_SITE_OTHER): Payer: PPO | Admitting: Internal Medicine

## 2022-11-02 VITALS — BP 118/78 | HR 69 | Temp 98.6°F | Ht 63.0 in | Wt 135.0 lb

## 2022-11-02 DIAGNOSIS — M79641 Pain in right hand: Secondary | ICD-10-CM | POA: Diagnosis not present

## 2022-11-02 DIAGNOSIS — K838 Other specified diseases of biliary tract: Secondary | ICD-10-CM

## 2022-11-02 DIAGNOSIS — I6522 Occlusion and stenosis of left carotid artery: Secondary | ICD-10-CM

## 2022-11-02 DIAGNOSIS — Z6823 Body mass index (BMI) 23.0-23.9, adult: Secondary | ICD-10-CM | POA: Diagnosis not present

## 2022-11-02 DIAGNOSIS — Z8639 Personal history of other endocrine, nutritional and metabolic disease: Secondary | ICD-10-CM

## 2022-11-02 DIAGNOSIS — L409 Psoriasis, unspecified: Secondary | ICD-10-CM | POA: Diagnosis not present

## 2022-11-02 DIAGNOSIS — M79645 Pain in left finger(s): Secondary | ICD-10-CM | POA: Diagnosis not present

## 2022-11-02 DIAGNOSIS — M79642 Pain in left hand: Secondary | ICD-10-CM | POA: Diagnosis not present

## 2022-11-02 DIAGNOSIS — F1011 Alcohol abuse, in remission: Secondary | ICD-10-CM

## 2022-11-02 DIAGNOSIS — I10 Essential (primary) hypertension: Secondary | ICD-10-CM

## 2022-11-02 DIAGNOSIS — M154 Erosive (osteo)arthritis: Secondary | ICD-10-CM | POA: Diagnosis not present

## 2022-11-02 NOTE — Patient Instructions (Signed)
She is gloing on vacation soon. She will have coronary calcium scoring and carotid duplex scans upon return from vacation. These have been ordered.

## 2022-11-03 ENCOUNTER — Ambulatory Visit: Payer: PPO | Admitting: Internal Medicine

## 2022-11-12 ENCOUNTER — Other Ambulatory Visit: Payer: Self-pay | Admitting: Internal Medicine

## 2022-11-17 ENCOUNTER — Other Ambulatory Visit (HOSPITAL_COMMUNITY): Payer: Self-pay | Admitting: Internal Medicine

## 2022-11-17 DIAGNOSIS — I6522 Occlusion and stenosis of left carotid artery: Secondary | ICD-10-CM

## 2022-11-21 ENCOUNTER — Other Ambulatory Visit (HOSPITAL_COMMUNITY): Payer: Self-pay | Admitting: Physician Assistant

## 2022-11-21 ENCOUNTER — Encounter (HOSPITAL_COMMUNITY): Payer: Self-pay

## 2022-11-21 DIAGNOSIS — R7989 Other specified abnormal findings of blood chemistry: Secondary | ICD-10-CM

## 2022-11-21 DIAGNOSIS — L814 Other melanin hyperpigmentation: Secondary | ICD-10-CM | POA: Diagnosis not present

## 2022-11-21 DIAGNOSIS — L570 Actinic keratosis: Secondary | ICD-10-CM | POA: Diagnosis not present

## 2022-11-21 DIAGNOSIS — L821 Other seborrheic keratosis: Secondary | ICD-10-CM | POA: Diagnosis not present

## 2022-11-22 ENCOUNTER — Other Ambulatory Visit: Payer: Self-pay | Admitting: Internal Medicine

## 2022-11-22 NOTE — Telephone Encounter (Signed)
Patient with elevated iron and ferritin and percent Negative hemochromatosis Biopsy with inflammation but no evidence of fibrosis.  3+ iron, hepatic iron index less than 1, not hemochromatosis. Patient does drink alcohol to go back from vacation, discussed not drinking any alcohol and following up in 1 month to recheck CBC, iron and ferritin indices, HFE associated hemochromatosis for further workup. After this we will plan office visit and will discuss what to proceed with but appears much more to be a secondary iron stores disease, possible fatty liver versus alcohol use.  Consider Peth test.

## 2022-11-24 ENCOUNTER — Ambulatory Visit (HOSPITAL_COMMUNITY): Payer: PPO

## 2022-11-24 ENCOUNTER — Ambulatory Visit (HOSPITAL_COMMUNITY)
Admission: RE | Admit: 2022-11-24 | Discharge: 2022-11-24 | Disposition: A | Discharge status: Home / Self Care | Payer: Self-pay | Source: Ambulatory Visit | Attending: Internal Medicine | Admitting: Internal Medicine

## 2022-11-24 ENCOUNTER — Ambulatory Visit (HOSPITAL_COMMUNITY)
Admission: RE | Admit: 2022-11-24 | Discharge: 2022-11-24 | Disposition: A | Discharge status: Home / Self Care | Payer: PPO | Source: Ambulatory Visit | Attending: Internal Medicine | Admitting: Internal Medicine

## 2022-11-24 DIAGNOSIS — I6522 Occlusion and stenosis of left carotid artery: Secondary | ICD-10-CM

## 2022-11-28 NOTE — Telephone Encounter (Signed)
Agree with above RG

## 2022-12-08 DIAGNOSIS — M5432 Sciatica, left side: Secondary | ICD-10-CM | POA: Diagnosis not present

## 2022-12-22 ENCOUNTER — Other Ambulatory Visit (INDEPENDENT_AMBULATORY_CARE_PROVIDER_SITE_OTHER): Payer: PPO

## 2022-12-22 DIAGNOSIS — R7989 Other specified abnormal findings of blood chemistry: Secondary | ICD-10-CM | POA: Diagnosis not present

## 2022-12-22 LAB — BASIC METABOLIC PANEL
BUN: 10 mg/dL (ref 6–23)
CO2: 24 meq/L (ref 19–32)
Calcium: 9.4 mg/dL (ref 8.4–10.5)
Chloride: 102 meq/L (ref 96–112)
Creatinine, Ser: 0.91 mg/dL (ref 0.40–1.20)
GFR: 63.51 mL/min (ref >60.00)
Glucose, Bld: 86 mg/dL (ref 70–99)
Potassium: 4.4 meq/L (ref 3.5–5.1)
Sodium: 136 meq/L (ref 135–145)

## 2022-12-22 LAB — IBC + FERRITIN
Ferritin: 219.3 ng/mL (ref 10.0–291.0)
Iron: 117 ug/dL (ref 42–145)
Saturation Ratios: 44 % (ref 20.0–50.0)
TIBC: 266 ug/dL (ref 250.0–450.0)
Transferrin: 190 mg/dL — ABNORMAL LOW (ref 212.0–360.0)

## 2022-12-22 LAB — CBC WITH DIFFERENTIAL/PLATELET
Basophils Absolute: 0 10*3/uL (ref 0.0–0.1)
Basophils Relative: 1.2 % (ref 0.0–3.0)
Eosinophils Absolute: 0.1 10*3/uL (ref 0.0–0.7)
Eosinophils Relative: 3.9 % (ref 0.0–5.0)
HCT: 40.5 % (ref 36.0–46.0)
Hemoglobin: 13.3 g/dL (ref 12.0–15.0)
Lymphocytes Relative: 41.7 % (ref 12.0–46.0)
Lymphs Abs: 1.4 10*3/uL (ref 0.7–4.0)
MCHC: 32.9 g/dL (ref 30.0–36.0)
MCV: 98.7 fL (ref 78.0–100.0)
Monocytes Absolute: 0.3 10*3/uL (ref 0.1–1.0)
Monocytes Relative: 9.4 % (ref 3.0–12.0)
Neutro Abs: 1.5 10*3/uL (ref 1.4–7.7)
Neutrophils Relative %: 43.8 % (ref 43.0–77.0)
Platelets: 184 10*3/uL (ref 150.0–400.0)
RBC: 4.1 Mil/uL (ref 3.87–5.11)
RDW: 13.3 % (ref 11.5–15.5)
WBC: 3.3 10*3/uL — ABNORMAL LOW (ref 4.0–10.5)

## 2022-12-22 LAB — HEPATIC FUNCTION PANEL
ALT: 37 U/L — ABNORMAL HIGH (ref 0–35)
AST: 34 U/L (ref 0–37)
Albumin: 4.1 g/dL (ref 3.5–5.2)
Alkaline Phosphatase: 154 U/L — ABNORMAL HIGH (ref 39–117)
Bilirubin, Direct: 0.2 mg/dL (ref 0.0–0.3)
Total Bilirubin: 0.7 mg/dL (ref 0.2–1.2)
Total Protein: 6.5 g/dL (ref 6.0–8.3)

## 2022-12-22 NOTE — Addendum Note (Signed)
Addended by: Ilda Foil on: 12/22/2022 08:54 AM   Modules accepted: Orders

## 2022-12-22 NOTE — Addendum Note (Signed)
Addended by: Ilda Foil on: 12/22/2022 08:53 AM   Modules accepted: Orders

## 2022-12-26 ENCOUNTER — Other Ambulatory Visit: Payer: Self-pay | Admitting: Medical Genetics

## 2022-12-26 DIAGNOSIS — Z006 Encounter for examination for normal comparison and control in clinical research program: Secondary | ICD-10-CM

## 2023-01-11 DIAGNOSIS — M65342 Trigger finger, left ring finger: Secondary | ICD-10-CM | POA: Diagnosis not present

## 2023-01-11 DIAGNOSIS — M79642 Pain in left hand: Secondary | ICD-10-CM | POA: Diagnosis not present

## 2023-01-23 ENCOUNTER — Other Ambulatory Visit (HOSPITAL_COMMUNITY)
Admission: RE | Admit: 2023-01-23 | Discharge: 2023-01-23 | Disposition: A | Discharge status: Home / Self Care | Payer: PPO | Source: Ambulatory Visit | Attending: Medical Genetics | Admitting: Medical Genetics

## 2023-01-23 DIAGNOSIS — Z006 Encounter for examination for normal comparison and control in clinical research program: Secondary | ICD-10-CM | POA: Insufficient documentation

## 2023-02-05 LAB — GENECONNECT MOLECULAR SCREEN: Genetic Analysis Overall Interpretation: NEGATIVE

## 2023-02-10 ENCOUNTER — Other Ambulatory Visit: Payer: Self-pay | Admitting: Internal Medicine

## 2023-02-12 ENCOUNTER — Ambulatory Visit
Admission: RE | Admit: 2023-02-12 | Discharge: 2023-02-12 | Disposition: A | Discharge status: Home / Self Care | Payer: PPO | Source: Ambulatory Visit | Attending: Internal Medicine | Admitting: Internal Medicine

## 2023-02-12 ENCOUNTER — Ambulatory Visit (INDEPENDENT_AMBULATORY_CARE_PROVIDER_SITE_OTHER): Payer: PPO

## 2023-02-12 VITALS — Ht 63.0 in | Wt 135.0 lb

## 2023-02-12 DIAGNOSIS — Z1231 Encounter for screening mammogram for malignant neoplasm of breast: Secondary | ICD-10-CM

## 2023-02-12 DIAGNOSIS — Z13820 Encounter for screening for osteoporosis: Secondary | ICD-10-CM

## 2023-02-12 DIAGNOSIS — Z0000 Encounter for general adult medical examination without abnormal findings: Secondary | ICD-10-CM | POA: Diagnosis not present

## 2023-02-12 NOTE — Patient Instructions (Signed)
Next appointment: Follow up in one year for your annual wellness visit.    Preventive Care 71 Years and Older, Female Preventive care refers to lifestyle choices and visits with your health care provider that can promote health and wellness. What does preventive care include? A yearly physical exam. This is also called an annual well check. Dental exams once or twice a year. Routine eye exams. Ask your health care provider how often you should have your eyes checked. Personal lifestyle choices, including: Daily care of your teeth and gums. Regular physical activity. Eating a healthy diet. Avoiding tobacco and drug use. Limiting alcohol use. Practicing safe sex. Taking low-dose aspirin every day. Taking vitamin and mineral supplements as recommended by your health care provider. What happens during an annual well check? The services and screenings done by your health care provider during your annual well check will depend on your age, overall health, lifestyle risk factors, and family history of disease. Counseling  Your health care provider may ask you questions about your: Alcohol use. Tobacco use. Drug use. Emotional well-being. Home and relationship well-being. Sexual activity. Eating habits. History of falls. Memory and ability to understand (cognition). Work and work Astronomer. Reproductive health. Screening  You may have the following tests or measurements: Height, weight, and BMI. Blood pressure. Lipid and cholesterol levels. These may be checked every 5 years, or more frequently if you are over 16 years old. Skin check. Lung cancer screening. You may have this screening every year starting at age 71 if you have a 30-pack-year history of smoking and currently smoke or have quit within the past 15 years. Fecal occult blood test (FOBT) of the stool. You may have this test every year starting at age 71. Flexible sigmoidoscopy or colonoscopy. You may have a  sigmoidoscopy every 5 years or a colonoscopy every 10 years starting at age 71. Hepatitis C blood test. Hepatitis B blood test. Sexually transmitted disease (STD) testing. Diabetes screening. This is done by checking your blood sugar (glucose) after you have not eaten for a while (fasting). You may have this done every 1-3 years. Bone density scan. This is done to screen for osteoporosis. You may have this done starting at age 71. Mammogram. This may be done every 1-2 years. Talk to your health care provider about how often you should have regular mammograms. Talk with your health care provider about your test results, treatment options, and if necessary, the need for more tests. Vaccines  Your health care provider may recommend certain vaccines, such as: Influenza vaccine. This is recommended every year. Tetanus, diphtheria, and acellular pertussis (Tdap, Td) vaccine. You may need a Td booster every 10 years. Zoster vaccine. You may need this after age 71. Pneumococcal 13-valent conjugate (PCV13) vaccine. One dose is recommended after age 71. Pneumococcal polysaccharide (PPSV23) vaccine. One dose is recommended after age 62. Talk to your health care provider about which screenings and vaccines you need and how often you need them. This information is not intended to replace advice given to you by your health care provider. Make sure you discuss any questions you have with your health care provider. Document Released: 03/12/2015 Document Revised: 11/03/2015 Document Reviewed: 12/15/2014 Elsevier Interactive Patient Education  2017 [employer].  Fall Prevention in the Home Falls can cause injuries. They can happen to people of all ages. There are many things you can do to make your home safe and to help prevent falls. What can I do on the outside of  my home? Regularly fix the edges of walkways and driveways and fix any cracks. Remove anything that might make you trip as you walk through a  door, such as a raised step or threshold. Trim any bushes or trees on the path to your home. Use bright outdoor lighting. Clear any walking paths of anything that might make someone trip, such as rocks or tools. Regularly check to see if handrails are loose or broken. Make sure that both sides of any steps have handrails. Any raised decks and porches should have guardrails on the edges. Have any leaves, snow, or ice cleared regularly. Use sand or salt on walking paths during winter. Clean up any spills in your garage right away. This includes oil or grease spills. What can I do in the bathroom? Use night lights. Install grab bars by the toilet and in the tub and shower. Do not use towel bars as grab bars. Use non-skid mats or decals in the tub or shower. If you need to sit down in the shower, use a plastic, non-slip stool. Keep the floor dry. Clean up any water that spills on the floor as soon as it happens. Remove soap buildup in the tub or shower regularly. Attach bath mats securely with double-sided non-slip rug tape. Do not have throw rugs and other things on the floor that can make you trip. What can I do in the bedroom? Use night lights. Make sure that you have a light by your bed that is easy to reach. Do not use any sheets or blankets that are too big for your bed. They should not hang down onto the floor. Have a firm chair that has side arms. You can use this for support while you get dressed. Do not have throw rugs and other things on the floor that can make you trip. What can I do in the kitchen? Clean up any spills right away. Avoid walking on wet floors. Keep items that you use a lot in easy-to-reach places. If you need to reach something above you, use a strong step stool that has a grab bar. Keep electrical cords out of the way. Do not use floor polish or wax that makes floors slippery. If you must use wax, use non-skid floor wax. Do not have throw rugs and other things  on the floor that can make you trip. What can I do with my stairs? Do not leave any items on the stairs. Make sure that there are handrails on both sides of the stairs and use them. Fix handrails that are broken or loose. Make sure that handrails are as long as the stairways. Check any carpeting to make sure that it is firmly attached to the stairs. Fix any carpet that is loose or worn. Avoid having throw rugs at the top or bottom of the stairs. If you do have throw rugs, attach them to the floor with carpet tape. Make sure that you have a light switch at the top of the stairs and the bottom of the stairs. If you do not have them, ask someone to add them for you. What else can I do to help prevent falls? Wear shoes that: Do not have high heels. Have rubber bottoms. Are comfortable and fit you well. Are closed at the toe. Do not wear sandals. If you use a stepladder: Make sure that it is fully opened. Do not climb a closed stepladder. Make sure that both sides of the stepladder are locked into place. Ask  someone to hold it for you, if possible. Clearly mark and make sure that you can see: Any grab bars or handrails. First and last steps. Where the edge of each step is. Use tools that help you move around (mobility aids) if they are needed. These include: Canes. Walkers. Scooters. Crutches. Turn on the lights when you go into a dark area. Replace any light bulbs as soon as they burn out. Set up your furniture so you have a clear path. Avoid moving your furniture around. If any of your floors are uneven, fix them. If there are any pets around you, be aware of where they are. Review your medicines with your doctor. Some medicines can make you feel dizzy. This can increase your chance of falling. Ask your doctor what other things that you can do to help prevent falls. This information is not intended to replace advice given to you by your health care provider. Make sure you discuss any  questions you have with your health care provider. Document Released: 12/10/2008 Document Revised: 07/22/2015 Document Reviewed: 03/20/2014 Elsevier Interactive Patient Education  2017 [employer].

## 2023-02-12 NOTE — Progress Notes (Signed)
Subjective:   Alexandra Taylor is a 72 y.o. female who presents for Medicare Annual (Subsequent) preventive examination.  Visit Complete: Virtual I connected with  Alexandra Taylor on 02/12/23 by a audio enabled telemedicine application and verified that I am speaking with the correct person using two identifiers.  Patient Location: Home  Provider Location: Office/Clinic  I discussed the limitations of evaluation and management by telemedicine. The patient expressed understanding and agreed to proceed.  Vital Signs: Because this visit was a virtual/telehealth visit, some criteria may be missing or patient reported. Any vitals not documented were not able to be obtained and vitals that have been documented are patient reported.  Patient Medicare AWV questionnaire was completed by the patient on 02/12/23; I have confirmed that all information answered by patient is correct and no changes since this date.  Cardiac Risk Factors include: advanced age (>90men, >57 women);hypertension     Objective:    Today's Vitals   02/12/23 1405 02/12/23 1413  Weight: 135 lb (61.2 kg)   Height: 5\' 3"  (1.6 m)   PainSc: 0-No pain 0-No pain   Body mass index is 23.91 kg/m.     02/12/2023    2:07 PM 06/14/2022   12:48 PM 03/15/2022    2:11 PM 02/23/2022    1:53 PM 10/17/2021   11:02 AM  Advanced Directives  Does Patient Have a Medical Advance Directive? Yes Yes No No Yes  Type of Advance Directive Living will;Healthcare Power of [employer] Power of [place];Living will   Living will  Does patient want to make changes to medical advance directive?     No - Patient declined  Copy of Healthcare Power of Attorney in Chart? No - copy requested      Would patient like information on creating a medical advance directive?   Yes (MAU/Ambulatory/Procedural Areas - Information given)      Current Medications (verified) Outpatient Encounter Medications as of 02/12/2023  Medication Sig    b complex vitamins tablet Take 1 tablet by mouth daily.   BIOTIN 5000 PO Take by mouth.   buPROPion (WELLBUTRIN XL) 150 MG 24 hr tablet TAKE 1 TABLET BY MOUTH EVERY DAY   FLUoxetine (PROZAC) 20 MG capsule TAKE 2 CAPSULES BY MOUTH EVERY DAY   fluticasone (FLONASE) 50 MCG/ACT nasal spray Place 1 spray into both nostrils as needed.   GLUCOSAMINE SULFATE PO Take by mouth daily.   levothyroxine (SYNTHROID) 50 MCG tablet TAKE 1 TABLET BY MOUTH EVERY DAY BEFORE BREAKFAST   ramipril (ALTACE) 10 MG capsule TAKE 1 CAPSULE BY MOUTH EVERY DAY   No facility-administered encounter medications on file as of 02/12/2023.    Allergies (verified) Patient has no known allergies.   History: Past Medical History:  Diagnosis Date   Hyperlipidemia    Hypertension    Osteopenia    Past Surgical History:  Procedure Laterality Date   hemithyroidectomy     Family History  Problem Relation Age of Onset   Hypertension Mother    Colon cancer Brother    Rectal cancer Neg Hx    Stomach cancer Neg Hx    Esophageal cancer Neg Hx    Social History   Socioeconomic History   Marital status: Married    Spouse name: Not on file   Number of children: Not on file   Years of education: Not on file   Highest education level: Some college, no degree  Occupational History   Not on file  Tobacco Use  Smoking status: Former    Current packs/day: 0.00    Average packs/day: 1 pack/day for 15.0 years (15.0 ttl pk-yrs)    Types: Cigarettes    Start date: 02/27/1969    Quit date: 02/28/1984    Years since quitting: 38.9   Smokeless tobacco: Never  Substance and Sexual Activity   Alcohol use: Yes    Comment: Daily   Drug use: No   Sexual activity: Yes  Other Topics Concern   Not on file  Social History Narrative   Not on file   Social Drivers of Health   Financial Resource Strain: Low Risk  (02/12/2023)   Overall Financial Resource Strain (CARDIA)    Difficulty of Paying Living Expenses: Not hard at all   Food Insecurity: No Food Insecurity (02/12/2023)   Hunger Vital Sign    Worried About Running Out of Food in the Last Year: Never true    Ran Out of Food in the Last Year: Never true  Transportation Needs: No Transportation Needs (02/12/2023)   PRAPARE - Administrator, Civil Service (Medical): No    Lack of Transportation (Non-Medical): No  Physical Activity: Sufficiently Active (02/12/2023)   Exercise Vital Sign    Days of Exercise per Week: 6 days    Minutes of Exercise per Session: 30 min  Stress: No Stress Concern Present (02/12/2023)   [employer] of Occupational Health - Occupational Stress Questionnaire    Feeling of Stress : Only a little  Social Connections: Moderately Integrated (02/12/2023)   Social Connection and Isolation Panel [NHANES]    Frequency of Communication with Friends and Family: Once a week    Frequency of Social Gatherings with Friends and Family: Twice a week    Attends Religious Services: Never    Database administrator or Organizations: Yes    Attends Engineer, structural: More than 4 times per year    Marital Status: Married    Tobacco Counseling Counseling given: Not Answered   Clinical Intake:  Pre-visit preparation completed: Yes  Pain : No/denies pain Pain Score: 0-No pain     BMI - recorded: 23.91 Nutritional Status: BMI of 19-24  Normal Nutritional Risks: None Diabetes: No  How often do you need to have someone help you when you read instructions, pamphlets, or other written materials from your doctor or pharmacy?: 1 - Never  Interpreter Needed?: No  Information entered by :: Alexandra Taylor, CMA   Activities of Daily Living    02/12/2023    2:12 PM 02/12/2023   11:45 AM  In your present state of health, do you have any difficulty performing the following activities:  Hearing? 1 1   Vision? 0 0   Difficulty concentrating or making decisions? 0 0   Walking or climbing stairs? 0 0   Dressing or  bathing? 0 0   Doing errands, shopping? 0 0   Preparing Food and eating ? N N   Using the Toilet? N N   In the past six months, have you accidently leaked urine? N N   Do you have problems with loss of bowel control? N N   Managing your Medications? N N   Managing your Finances? N N   Housekeeping or managing your Housekeeping? N N      Patient-reported     Patient Care Team: Margaree Mackintosh, MD as PCP - General (Internal Medicine)  Indicate any recent Medical Services you may have received from other than  [facility] providers in the past year (date may be approximate).     Assessment:   This is a routine wellness examination for Alexandra Taylor.  Hearing/Vision screen No results found.   Goals Addressed   None   Depression Screen    02/12/2023    2:12 PM 11/02/2022   11:30 AM 06/06/2022    3:31 PM 04/27/2022    9:32 AM 03/16/2022   11:48 AM 10/17/2021   11:03 AM 10/15/2020   11:15 AM  PHQ 2/9 Scores  PHQ - 2 Score 0 0 0 0 0 0 0    Fall Risk    02/12/2023   11:45 AM 11/02/2022   11:30 AM 06/06/2022    3:31 PM 04/27/2022    9:32 AM 03/16/2022   11:48 AM  Fall Risk   Falls in the past year? 0  0 0 0 0  Number falls in past yr:  0  0 0  Injury with Fall?  0 0 0 0  Risk for fall due to :  No Fall Risks No Fall Risks No Fall Risks No Fall Risks  Follow up  Falls evaluation completed  Falls prevention discussed Falls prevention discussed     Patient-reported     MEDICARE RISK AT HOME: Medicare Risk at Home Any stairs in or around the home?: Yes If so, are there any without handrails?: Yes Home free of loose throw rugs in walkways, pet beds, electrical cords, etc?: Yes Adequate lighting in your home to reduce risk of falls?: Yes Life alert?: No Use of a cane, walker or w/c?: No Grab bars in the bathroom?: No Shower chair or bench in shower?: No Elevated toilet seat or a handicapped toilet?: No  TIMED UP AND GO:  Was the test performed?  No    Cognitive Function:         02/12/2023    2:08 PM 10/17/2021   11:05 AM  6CIT Screen  What Year? 0 points 0 points  What month? 0 points 0 points  What time? 0 points 0 points  Count back from 20 0 points 0 points  Months in reverse 0 points 0 points  Repeat phrase 0 points 0 points  Total Score 0 points 0 points    Immunizations Immunization History  Administered Date(s) Administered   Influenza Split 01/29/2012   Influenza, High Dose Seasonal PF 04/30/2018   Influenza,inj,Quad PF,6+ Mos 12/24/2012, 10/25/2018, 01/03/2021, 03/16/2022   Moderna Covid-19 Vaccine Bivalent Booster 41yrs & up 12/05/2020   Moderna Sars-Covid-2 Vaccination 04/11/2019, 05/10/2019, 12/22/2019, 09/15/2020   Pneumococcal Conjugate-13 10/06/2016   Pneumococcal Polysaccharide-23 10/08/2017   Tdap 12/28/2008, 10/14/2019    TDAP status: Up to date 10/14/2019   Pneumococcal vaccine status: Up to date  Covid-19 vaccine status: Information provided on how to obtain vaccines.   Qualifies for Shingles Vaccine? Yes   Zostavax completed No   Shingrix Completed?: No.    Education has been provided regarding the importance of this vaccine. Patient has been advised to call insurance company to determine out of pocket expense if they have not yet received this vaccine. Advised may also receive vaccine at local pharmacy or Health Dept. Verbalized acceptance and understanding.  Screening Tests Health Maintenance  Topic Date Due   DEXA SCAN  09/11/2015   MAMMOGRAM  01/06/2022   INFLUENZA VACCINE  05/28/2023 (Originally 09/28/2022)   Medicare Annual Wellness (AWV)  02/12/2024   Colonoscopy  12/12/2027   DTaP/Tdap/Td (3 - Td or Tdap) 10/13/2029   Pneumonia  Vaccine 48+ Years old  Completed   Hepatitis C Screening  Completed   HPV VACCINES  Aged Out   COVID-19 Vaccine  Discontinued   Zoster Vaccines- Shingrix  Discontinued    Health Maintenance  Health Maintenance Due  Topic Date Due   DEXA SCAN  09/11/2015   MAMMOGRAM  01/06/2022     Colorectal cancer screening: Type of screening: Colonoscopy. Completed 12/11/2017. Repeat every 10 years  Mammogram status: Ordered 02/12/23. Pt provided with contact info and advised to call to schedule appt.   Bone Density status: Ordered 02/12/23. Pt provided with contact info and advised to call to schedule appt.  LuCancer Screening: (Low Dose CT Chest recommended if Age 79-80 years, 20 pack-year current]  Vision Screening: Recommended annual ophthalmology exams for early detection of glaucoma and other dist up to date with their annual eye exam?  Yes  Who is the provider or what is the name of the office in which the patient attends annual eye exams?  [facility]  If pt is not established with a provider, would they like to be referred to a provider to establish care? No .   Dental Screening: Recommended annual dental exams for proper oral hygiene   Community Resource Referral / Chronic Care Management: CRR required this visit?  No   CCM required this visit?  No     Plan:     I have personally reviewed and noted the following in the patient's chart:   Medical and social history Use of alcohol, tobacco or illicit drugs  Current medications and supplements including opioid prescriptions. Patient is not currently taking opioid prescriptions. Functional ability and status Nutritional status Physical activity Advanced directives List of other physicians Hospitalizations, surgeries, and ER visits in previous 12 months Vitals Screenings to include cognitive, depression, and falls Referrals and appointments  In addition, I have reviewed and discussed with patient certain preventive protocols, quality metrics, and best practice recommendations. A written personalized care plan for preventive services as well as general preventive health recommendations were provided to patient.     Glanda Spanbauer Sharlyne Cai, CMA   02/12/2023   After Visit Summary: (Mail) Due to this  being a telephonic visit, the after visit summary with patients personalized plan was offered to patient via mail

## 2023-02-15 ENCOUNTER — Other Ambulatory Visit: Payer: Self-pay | Admitting: Internal Medicine

## 2023-02-28 HISTORY — PX: COLONOSCOPY: SHX174

## 2023-03-07 DIAGNOSIS — D0439 Carcinoma in situ of skin of other parts of face: Secondary | ICD-10-CM | POA: Diagnosis not present

## 2023-03-07 DIAGNOSIS — D485 Neoplasm of uncertain behavior of skin: Secondary | ICD-10-CM | POA: Diagnosis not present

## 2023-03-08 ENCOUNTER — Ambulatory Visit (INDEPENDENT_AMBULATORY_CARE_PROVIDER_SITE_OTHER): Payer: PPO

## 2023-03-08 VITALS — BP 110/70 | HR 70 | Ht 63.0 in | Wt 135.0 lb

## 2023-03-08 DIAGNOSIS — Z23 Encounter for immunization: Secondary | ICD-10-CM | POA: Diagnosis not present

## 2023-03-08 DIAGNOSIS — L409 Psoriasis, unspecified: Secondary | ICD-10-CM | POA: Diagnosis not present

## 2023-03-08 DIAGNOSIS — Z6824 Body mass index (BMI) 24.0-24.9, adult: Secondary | ICD-10-CM | POA: Diagnosis not present

## 2023-03-08 DIAGNOSIS — M154 Erosive (osteo)arthritis: Secondary | ICD-10-CM | POA: Diagnosis not present

## 2023-03-08 DIAGNOSIS — M79645 Pain in left finger(s): Secondary | ICD-10-CM | POA: Diagnosis not present

## 2023-03-08 NOTE — Progress Notes (Signed)
 Patient is here for a flu vaccine, patient tolerated well L deltoid.

## 2023-03-23 ENCOUNTER — Other Ambulatory Visit: Payer: Self-pay | Admitting: Internal Medicine

## 2023-04-10 ENCOUNTER — Encounter: Payer: Self-pay | Admitting: Gastroenterology

## 2023-04-10 ENCOUNTER — Other Ambulatory Visit: Payer: PPO

## 2023-04-10 ENCOUNTER — Other Ambulatory Visit: Payer: Self-pay

## 2023-04-10 DIAGNOSIS — E039 Hypothyroidism, unspecified: Secondary | ICD-10-CM | POA: Diagnosis not present

## 2023-04-10 DIAGNOSIS — R748 Abnormal levels of other serum enzymes: Secondary | ICD-10-CM

## 2023-04-11 ENCOUNTER — Encounter: Payer: Self-pay | Admitting: Gastroenterology

## 2023-04-11 LAB — TSH: TSH: 0.95 m[IU]/L (ref 0.40–4.50)

## 2023-04-12 ENCOUNTER — Ambulatory Visit: Payer: PPO | Admitting: Internal Medicine

## 2023-04-12 ENCOUNTER — Encounter: Payer: Self-pay | Admitting: Internal Medicine

## 2023-04-12 VITALS — BP 120/80 | HR 66 | Ht 63.0 in | Wt 136.0 lb

## 2023-04-12 DIAGNOSIS — F1090 Alcohol use, unspecified, uncomplicated: Secondary | ICD-10-CM

## 2023-04-12 DIAGNOSIS — Z8659 Personal history of other mental and behavioral disorders: Secondary | ICD-10-CM

## 2023-04-12 DIAGNOSIS — Z789 Other specified health status: Secondary | ICD-10-CM | POA: Diagnosis not present

## 2023-04-12 DIAGNOSIS — I10 Essential (primary) hypertension: Secondary | ICD-10-CM

## 2023-04-12 DIAGNOSIS — H903 Sensorineural hearing loss, bilateral: Secondary | ICD-10-CM

## 2023-04-12 DIAGNOSIS — Z8639 Personal history of other endocrine, nutritional and metabolic disease: Secondary | ICD-10-CM | POA: Diagnosis not present

## 2023-04-12 NOTE — Progress Notes (Addendum)
 Patient Care Team: Margaree Mackintosh, MD as PCP - General (Internal Medicine)  Visit Date: 04/12/23  Subjective:   Chief Complaint  Patient presents with   Medical Management of Chronic Issues   Hypertension  Patient ZO:XWRUEAVW M Trapani,Female DOB:[birth year],73 y.o. UJW:[id number]   73 y.o. Female presents today for 6 months follow-up for HTN, HLD, & Hypothyroidism. Patient has a past medical history of osteopenia, SNHL bilaterally and hepatic hemosiderosis.    History of Hypertension treated with Ramipril 10 mg daily. Blood pressure normo-tensive today at 120/80.   History of Hypothyroidism treated with Levothyroxine 50 mcg daily before breakfast. In 2004 was diagnosed with Hyperthyroidism and underwent Hemithyroidectomy. TSH normal at 0.95 on 04/11/23.   History of Anxiety/Depression treated with 150 mg Wellbutrin-XL daily and 20 mg Prozac BID.   Followed by Dr. Orvan Falconer for history of Elevated Liver Functions and  Hemosiderosis. She has ceased all alcohol intake. MRI Liver 10/04/2022 showed  Subtle evidence of hepatic iron deposition; No suspicious liver lesion or contrast enhancement on very motion limited multiphasic contrast enhanced sequences; Unchanged minimal common bile duct dilatation, measuring up to 0.9 cm. No calculus or other obstruction identified to the ampulla. Hepatic Function Panel ordered for the future, she notes that she has an appointment with them next week.   Bone Density scheduled for 10/11/2023.   Vaccine Counseling: states that when she went to receive her RSV vaccination, they informed her that she isn't qualified yet as she is younger than 73 y.o.  Past Medical History:  Diagnosis Date   Hyperlipidemia    Hypertension    Osteopenia   No Known Allergies  Family History  Problem Relation Age of Onset   Hypertension Mother    Colon cancer Brother    Rectal cancer Neg Hx    Stomach cancer Neg Hx    Esophageal cancer Neg Hx    Social History   Social  History Narrative   Not on file  Recently got a dog (poodle) Review of Systems  Constitutional:  Negative for fever and malaise/fatigue.  HENT:  Negative for congestion.   Eyes:  Negative for blurred vision.  Respiratory:  Negative for cough and shortness of breath.   Cardiovascular:  Negative for chest pain, palpitations and leg swelling.  Gastrointestinal:  Negative for vomiting.  Musculoskeletal:  Negative for back pain.  Skin:  Negative for rash.  Neurological:  Negative for loss of consciousness and headaches.     Objective:  Vitals: BP 120/80   Pulse 66   Ht 5\' 3"  (1.6 m)   Wt 136 lb (61.7 kg)   SpO2 98%   BMI 24.09 kg/m   Physical Exam Vitals and nursing note reviewed.  Constitutional:      General: She is not in acute distress.    Appearance: Normal appearance. She is not toxic-appearing.  HENT:     Head: Normocephalic and atraumatic.     Right Ear: Tympanic membrane, ear canal and external ear normal. Decreased hearing (bilateral hearing aids) noted.     Left Ear: Tympanic membrane, ear canal and external ear normal. Decreased hearing (bilateral hearing aids) noted.  Neck:     Thyroid: No thyromegaly.  Cardiovascular:     Rate and Rhythm: Normal rate and regular rhythm. No extrasystoles are present.    Pulses: Normal pulses.     Heart sounds: Normal heart sounds. No murmur heard.    No friction rub. No gallop.  Pulmonary:     Effort: Pulmonary effort  is normal. No respiratory distress.     Breath sounds: Normal breath sounds. No wheezing or rales.  Musculoskeletal:     Cervical back: Neck supple.  Skin:    General: Skin is warm and dry.  Neurological:     Mental Status: She is alert and oriented to person, place, and time. Mental status is at baseline.  Psychiatric:        Mood and Affect: Mood normal.        Behavior: Behavior normal.        Thought Content: Thought content normal.        Judgment: Judgment normal.     Results:  Studies Obtained And  Personally Reviewed By Me:  MRI ABDOMEN WITHOUT AND WITH CONTRAST 10/04/2022   FINDINGS: Examination is significantly limited by breath motion artifact, particularly multiphasic contrast enhanced sequences, which are nearly nondiagnostic.   Lower chest: No acute abnormality.   Hepatobiliary: No solid liver abnormality is seen. No suspicious liver lesion or contrast enhancement on very motion limited multiphasic contrast enhanced sequences. T2 hypointensity of the liver parenchyma. Subtle opposed phase signal inversion on in and opposed phase imaging, which is however very limited by breath motion artifact. No gallstones. No gallbladder wall thickening. Unchanged minimal common bile duct dilatation, measuring up to 0.9 cm. No calculus or other obstruction identified to the ampulla.   Pancreas: Unremarkable. No pancreatic ductal dilatation or surrounding inflammatory changes.   Spleen: Normal in size without significant abnormality.   Adrenals/Urinary Tract: Adrenal glands are unremarkable. Kidneys are normal, without renal calculi, solid lesion, or hydronephrosis.   Stomach/Bowel: Stomach is within normal limits. No evidence of bowel wall thickening, distention, or inflammatory changes.   Vascular/Lymphatic: No significant vascular findings are present. No enlarged abdominal lymph nodes.   Other: No abdominal wall hernia or abnormality. No ascites.   Musculoskeletal: No acute or significant osseous findings.   IMPRESSION: 1. Examination is significantly limited by breath motion artifact, particularly multiphasic contrast enhanced sequences, which are nearly nondiagnostic. 2. Subtle evidence of hepatic iron deposition. 3. No suspicious liver lesion or contrast enhancement on very motion limited multiphasic contrast enhanced sequences. 4. Unchanged minimal common bile duct dilatation, measuring up to 0.9 cm. No calculus or other obstruction identified to the ampulla.   Labs:      Component Value Date/Time   NA 136 12/22/2022 0854   K 4.4 12/22/2022 0854   CL 102 12/22/2022 0854   CO2 24 12/22/2022 0854   GLUCOSE 86 12/22/2022 0854   BUN 10 12/22/2022 0854   CREATININE 0.91 12/22/2022 0854   CREATININE 0.91 03/15/2022 1437   CREATININE 0.96 10/11/2021 0918   CALCIUM 9.4 12/22/2022 0854   PROT 6.5 12/22/2022 0854   ALBUMIN 4.1 12/22/2022 0854   AST 34 12/22/2022 0854   AST 40 03/15/2022 1437   ALT 37 (H) 12/22/2022 0854   ALT 40 03/15/2022 1437   ALKPHOS 154 (H) 12/22/2022 0854   BILITOT 0.7 12/22/2022 0854   BILITOT 0.6 03/15/2022 1437   GFRNONAA >60 03/15/2022 1437   GFRNONAA 85 01/01/2020 1656   GFRAA 98 01/01/2020 1656    Lab Results  Component Value Date   WBC 3.3 (L) 12/22/2022   HGB 13.3 12/22/2022   HCT 40.5 12/22/2022   MCV 98.7 12/22/2022   PLT 184.0 12/22/2022   Lab Results  Component Value Date   CHOL 186 04/18/2022   HDL 95 04/18/2022   LDLCALC 83 04/18/2022   TRIG 27 04/18/2022   CHOLHDL  2.0 04/18/2022   Lab Results  Component Value Date   TSH 0.95 04/10/2023    Assessment & Plan:   Hypertension treated with Ramipril 10 mg daily. Blood pressure normo-tensive today at 120/80.   Hypothyroidism treated with Levothyroxine 50 mcg daily before breakfast. In 2004 was diagnosed with Hyperthyroidism and underwent Hemithyroidectomy. TSH normal at 0.95 on 04/11/23.   Anxiety/Depression treated with 150 mg Wellbutrin-XL daily and 20 mg Prozac BID.   Followed by Dr. Orvan Falconer for history of Elevated Liver Functions and  Hemosiderosis. Has ceased all alcohol intake. MRI Liver 10/04/2022 showed Subtle evidence of hepatic iron deposition; No suspicious liver lesion or contrast enhancement on very motion limited multiphasic contrast enhanced sequences; Unchanged minimal common bile duct dilatation, measuring up to 0.9 cm. No calculus or other obstruction identified to the ampulla. Hepatic Function Panel ordered for the future, she notes that  she has an appointment with them next week.   Bone Density scheduled for 10/11/2023.   Vaccine Counseling: states that when she went to receive her RSV vaccination, they informed her that she isn't qualified yet as she is younger than 73 y.o. I have looked into this.She had RSV illness confirmed in 2021. I would like for her to consider booster and mailed info to her. Hx of liver disease and ETOH use disorder.    I,Emily Lagle,acting as a Neurosurgeon for Margaree Mackintosh, MD.,have documented all relevant documentation on the behalf of Margaree Mackintosh, MD,as directed by  Margaree Mackintosh, MD while in the presence of Margaree Mackintosh, MD.   I, Margaree Mackintosh, MD, have reviewed all documentation for this visit. The documentation on 04/27/23 for the exam, diagnosis, procedures, and orders are all accurate and complete.

## 2023-04-13 ENCOUNTER — Other Ambulatory Visit (INDEPENDENT_AMBULATORY_CARE_PROVIDER_SITE_OTHER): Payer: PPO

## 2023-04-13 DIAGNOSIS — R748 Abnormal levels of other serum enzymes: Secondary | ICD-10-CM | POA: Diagnosis not present

## 2023-04-13 LAB — HEPATIC FUNCTION PANEL
ALT: 24 U/L (ref 0–35)
AST: 31 U/L (ref 0–37)
Albumin: 4.1 g/dL (ref 3.5–5.2)
Alkaline Phosphatase: 138 U/L — ABNORMAL HIGH (ref 39–117)
Bilirubin, Direct: 0.1 mg/dL (ref 0.0–0.3)
Total Bilirubin: 0.6 mg/dL (ref 0.2–1.2)
Total Protein: 6.7 g/dL (ref 6.0–8.3)

## 2023-04-17 ENCOUNTER — Encounter: Payer: Self-pay | Admitting: Internal Medicine

## 2023-04-17 NOTE — Telephone Encounter (Signed)
Called patient and let her know what Dr Lenord Fellers had said and she said she would go back to [facility] and tell them she is compromised and get the vaccine now.

## 2023-04-25 ENCOUNTER — Encounter: Payer: Self-pay | Admitting: Gastroenterology

## 2023-04-25 ENCOUNTER — Ambulatory Visit: Payer: PPO | Admitting: Gastroenterology

## 2023-04-25 VITALS — BP 110/70 | HR 70 | Ht 63.0 in | Wt 135.0 lb

## 2023-04-25 DIAGNOSIS — Z800 Family history of malignant neoplasm of digestive organs: Secondary | ICD-10-CM

## 2023-04-25 DIAGNOSIS — R7989 Other specified abnormal findings of blood chemistry: Secondary | ICD-10-CM | POA: Diagnosis not present

## 2023-04-25 DIAGNOSIS — K709 Alcoholic liver disease, unspecified: Secondary | ICD-10-CM

## 2023-04-25 DIAGNOSIS — F1090 Alcohol use, unspecified, uncomplicated: Secondary | ICD-10-CM

## 2023-04-25 NOTE — Progress Notes (Signed)
 Chief Complaint: FU  Referring Provider:  Margaree Mackintosh, MD      ASSESSMENT AND PLAN;   #1. Abn Lfts d/t ETOH liver disease. No fibrosis/cirrhosis on liver Bx 05/2022  #2. FH CRC  Plan: -US liver -No ETOH as she has been doing. -In 8 weeks, CBC, CMP, iron studies, AMA, GGT, INR -FU in 12 weeks. At FU, consider repeat colon d/t FH CRC.   HPI:    Alexandra Taylor is a 73 y.o. female  Patient of Dr. Orvan Falconer For follow-up See previous note (from [facility] dated 09/08/2022) for details Here for abnormal LFTs Off all alcohol Doing very well.  LFTs getting better.  She is pleased with the progress.  No itching.  No jaundice dark urine or pale stools.     Latest Ref Rng & Units 04/13/2023   11:03 AM 12/22/2022    8:54 AM 09/08/2022   11:46 AM  Hepatic Function  Total Protein 6.0 - 8.3 g/dL 6.7  6.5  7.0   Albumin 3.5 - 5.2 g/dL 4.1  4.1  4.3   AST 0 - 37 U/L 31  34  32   ALT 0 - 35 U/L 24  37  24   Alk Phosphatase 39 - 117 U/L 138  154  144   Total Bilirubin 0.2 - 1.2 mg/dL 0.6  0.7  0.8   Bilirubin, Direct 0.0 - 0.3 mg/dL 0.1  0.2  0.2    Liver/GI WU: -Neg HFE gene -Liver Bx: 06/14/2022 Histiocytic portal and lobular inflammation suggestive of resolved  liver injury. Hepatocellular siderosis. No evidence of fibrosis.  Iron index <1 -Negative ASMA, AFP, acute hepatitis panel -Patient is not immune to hepatitis B -Ferritin 290 (January 2024)  12/11/2017 colonoscopy Dr. Pati Gallo sigmoid diverticulosis otherwise normal TI, repeat 5 years due to family history (11/2022) 10/24/2021 RUQ US echogenic liver with nodular contour suggestive of chronic liver disease possible cirrhosis Labs 01/27/22 ferritin 353, iron 168 Labs 03/15/22 ferritin 290, iron 96 02/03/2022 MRI showed heterogeneous left signal liver NVC Quince compatible with iron deposition liver, no findings suggestive of cirrhosis, numerous nodular areas T2 signal and relative increased T1 early arterial phase favored to be  iron deposition sparing repeat MRI liver 3 months using Eovist contrast material to reassess these areas. Mild fusiform dilatation of the CBD measures up to 8 mm. No signs of choledocholithiasis. Signs of iron deposition within the spleen are also identified.  Past Medical History:  Diagnosis Date   Hyperlipidemia    Hypertension    Osteopenia     Past Surgical History:  Procedure Laterality Date   hemithyroidectomy      Family History  Problem Relation Age of Onset   Hypertension Mother    Colon cancer Brother    Rectal cancer Neg Hx    Stomach cancer Neg Hx    Esophageal cancer Neg Hx     Social History   Tobacco Use   Smoking status: Former    Current packs/day: 0.00    Average packs/day: 1 pack/day for 15.0 years (15.0 ttl pk-yrs)    Types: Cigarettes    Start date: 02/27/1969    Quit date: 02/28/1984    Years since quitting: 39.1   Smokeless tobacco: Never  Substance Use Topics   Alcohol use: Yes    Comment: Daily   Drug use: No    Current Outpatient Medications  Medication Sig Dispense Refill   b complex vitamins tablet Take 1 tablet by mouth  daily.     BIOTIN 5000 PO Take by mouth.     buPROPion (WELLBUTRIN XL) 150 MG 24 hr tablet TAKE 1 TABLET BY MOUTH EVERY DAY 90 tablet 1   FLUoxetine (PROZAC) 20 MG capsule TAKE 2 CAPSULES BY MOUTH EVERY DAY 180 capsule 1   fluticasone (FLONASE) 50 MCG/ACT nasal spray Place 1 spray into both nostrils as needed. 1 g 5   GLUCOSAMINE SULFATE PO Take by mouth daily.     levothyroxine (SYNTHROID) 50 MCG tablet TAKE 1 TABLET BY MOUTH EVERY DAY BEFORE BREAKFAST 90 tablet 1   ramipril (ALTACE) 10 MG capsule TAKE 1 CAPSULE BY MOUTH EVERY DAY 90 capsule 1   No current facility-administered medications for this visit.    No Known Allergies  Review of Systems:  neg     Physical Exam:    BP 110/70   Pulse 70   Ht 5\' 3"  (1.6 m)   Wt 135 lb (61.2 kg)   BMI 23.91 kg/m  Wt Readings from Last 3 Encounters:  04/25/23 135 lb  (61.2 kg)  04/12/23 136 lb (61.7 kg)  03/08/23 135 lb (61.2 kg)   Constitutional:  Well-developed, in no acute distress. Psychiatric: Normal mood and affect. Behavior is normal. HEENT: Pupils normal.  Conjunctivae are normal. No scleral icterus. Cardiovascular: Normal rate, regular rhythm. No edema Pulmonary/chest: Effort normal and breath sounds normal. No wheezing, rales or rhonchi. Abdominal: Soft, nondistended. Nontender. Bowel sounds active throughout. There are no masses palpable. No hepatomegaly. Rectal: Deferred Neurological: Alert and oriented to person place and time. Skin: Skin is warm and dry. No rashes noted.  Data Reviewed: I have personally reviewed following labs and imaging studies  CBC:    Latest Ref Rng & Units 12/22/2022    8:54 AM 09/08/2022   11:46 AM 06/14/2022   12:38 PM  CBC  WBC 4.0 - 10.5 K/uL 3.3  3.5  4.2   Hemoglobin 12.0 - 15.0 g/dL 16.1  09.6  04.5   Hematocrit 36.0 - 46.0 % 40.5  43.9  39.1   Platelets 150.0 - 400.0 K/uL 184.0  191.0  208     CMP:    Latest Ref Rng & Units 04/13/2023   11:03 AM 12/22/2022    8:54 AM 09/08/2022   11:46 AM  CMP  Glucose 70 - 99 mg/dL  86  82   BUN 6 - 23 mg/dL  10  15   Creatinine 4.09 - 1.20 mg/dL  8.11  9.14   Sodium 782 - 145 mEq/L  136  137   Potassium 3.5 - 5.1 mEq/L  4.4  4.9   Chloride 96 - 112 mEq/L  102  100   CO2 19 - 32 mEq/L  24  30   Calcium 8.4 - 10.5 mg/dL  9.4  9.8   Total Protein 6.0 - 8.3 g/dL 6.7  6.5  7.0   Total Bilirubin 0.2 - 1.2 mg/dL 0.6  0.7  0.8   Alkaline Phos 39 - 117 U/L 138  154  144   AST 0 - 37 U/L 31  34  32   ALT 0 - 35 U/L 24  37  24         Edman Circle, MD 04/25/2023, 11:05 AM  Cc: Margaree Mackintosh, MD

## 2023-04-25 NOTE — Patient Instructions (Signed)
 _______________________________________________________  If your blood pressure at your visit was 140/90 or greater, please contact your primary care physician to follow up on this.  _______________________________________________________  If you are age 72 or older, your body mass index should be between 23-30. Your Body mass index is 23.91 kg/m. If this is out of the aforementioned range listed, please consider follow up with your Primary Care Provider.  If you are age 38 or younger, your body mass index should be between 19-25. Your Body mass index is 23.91 kg/m. If this is out of the aformentioned range listed, please consider follow up with your Primary Care Provider.   ________________________________________________________  The [facility] providers would like to encourage you to use [facility] to communicate with providers for non-urgent requests or questions.  Due to long hold times on the telephone, sending your provider a message by [facility] may be a faster and more efficient way to get a response.  Please allow 48 business hours for a response.  Please remember that this is for non-urgent requests.  _______________________________________________________  No alcohol   Please come back the week of June 23, 2023 to have blood work done  [facility] on [address], [address]. The lab is located at the first door on the left as you exit the elevator.   You have been scheduled for an appointment with Dr. Chales Abrahams on 07-19-23 at 1040am. Please arrive 10 minutes early for your appointment.   You have been scheduled for an abdominal ultrasound at [facility]iology (1st floor of hospital) on 05-01-23 at 8am. Please arrive 15 minutes prior to your appointment for registration. Make certain not to have anything to eat or drink midnight prior to your appointment. Should you need to reschedule your appointment, please contact radiology at [phone]. This test typically takes about 30 minutes to  perform.  Thank you,  Dr. Lynann Bologna

## 2023-04-27 NOTE — Patient Instructions (Addendum)
 It was a pleasure to see you today. Schedule bone density for August 2025-please call them. With liver issues, please consider RSV vaccine. Continue cirrent meds. Follow up in August for annual exam.

## 2023-05-01 ENCOUNTER — Ambulatory Visit (HOSPITAL_COMMUNITY)
Admission: RE | Admit: 2023-05-01 | Discharge: 2023-05-01 | Disposition: A | Discharge status: Home / Self Care | Payer: PPO | Source: Ambulatory Visit | Attending: Gastroenterology | Admitting: Gastroenterology

## 2023-05-01 DIAGNOSIS — R7989 Other specified abnormal findings of blood chemistry: Secondary | ICD-10-CM | POA: Diagnosis not present

## 2023-05-07 ENCOUNTER — Encounter: Payer: Self-pay | Admitting: Gastroenterology

## 2023-05-09 ENCOUNTER — Encounter: Payer: Self-pay | Admitting: Gastroenterology

## 2023-05-09 NOTE — Telephone Encounter (Signed)
 Pt was contacted off of my chart that pt sent. Pt was educated on ways to improve the fatty liver. Pt verbalized understanding with all questions answered.

## 2023-05-10 DIAGNOSIS — S76012D Strain of muscle, fascia and tendon of left hip, subsequent encounter: Secondary | ICD-10-CM | POA: Diagnosis not present

## 2023-05-22 DIAGNOSIS — L578 Other skin changes due to chronic exposure to nonionizing radiation: Secondary | ICD-10-CM | POA: Diagnosis not present

## 2023-05-22 DIAGNOSIS — L814 Other melanin hyperpigmentation: Secondary | ICD-10-CM | POA: Diagnosis not present

## 2023-05-22 DIAGNOSIS — D225 Melanocytic nevi of trunk: Secondary | ICD-10-CM | POA: Diagnosis not present

## 2023-05-22 DIAGNOSIS — D0439 Carcinoma in situ of skin of other parts of face: Secondary | ICD-10-CM | POA: Diagnosis not present

## 2023-05-22 DIAGNOSIS — L821 Other seborrheic keratosis: Secondary | ICD-10-CM | POA: Diagnosis not present

## 2023-05-22 DIAGNOSIS — L570 Actinic keratosis: Secondary | ICD-10-CM | POA: Diagnosis not present

## 2023-05-22 DIAGNOSIS — Z09 Encounter for follow-up examination after completed treatment for conditions other than malignant neoplasm: Secondary | ICD-10-CM | POA: Diagnosis not present

## 2023-05-24 DIAGNOSIS — S76012D Strain of muscle, fascia and tendon of left hip, subsequent encounter: Secondary | ICD-10-CM | POA: Diagnosis not present

## 2023-05-31 DIAGNOSIS — S76012D Strain of muscle, fascia and tendon of left hip, subsequent encounter: Secondary | ICD-10-CM | POA: Diagnosis not present

## 2023-06-01 ENCOUNTER — Telehealth: Payer: Self-pay | Admitting: Internal Medicine

## 2023-06-01 DIAGNOSIS — G4730 Sleep apnea, unspecified: Secondary | ICD-10-CM

## 2023-06-01 NOTE — Telephone Encounter (Signed)
 Do you want me to put in a referral for Pulmonary for sleep study?

## 2023-06-01 NOTE — Telephone Encounter (Signed)
 Copied from CRM [id number]. Topic: Referral - Request for Referral >> Jun 01, 2023 11:52 AM Fuller Mandril wrote: Did the patient discuss referral with their provider in the last year? Yes (If No - schedule appointment) (If Yes - send message)  Appointment offered? No  Type of order/referral and detailed reason for visit: Sleep Study   Preference of office, provider, location: [facility]  If referral order, have you been seen by this specialty before? No (If Yes, this issue or another issue? When? Where?  Can we respond through MyChart? Yes

## 2023-06-04 NOTE — Telephone Encounter (Signed)
 Referral placed and patient notified.

## 2023-06-07 DIAGNOSIS — S76012D Strain of muscle, fascia and tendon of left hip, subsequent encounter: Secondary | ICD-10-CM | POA: Diagnosis not present

## 2023-06-22 DIAGNOSIS — M7062 Trochanteric bursitis, left hip: Secondary | ICD-10-CM | POA: Diagnosis not present

## 2023-07-07 ENCOUNTER — Telehealth: Admitting: Nurse Practitioner

## 2023-07-07 DIAGNOSIS — L089 Local infection of the skin and subcutaneous tissue, unspecified: Secondary | ICD-10-CM | POA: Diagnosis not present

## 2023-07-07 DIAGNOSIS — W5503XA Scratched by cat, initial encounter: Secondary | ICD-10-CM

## 2023-07-07 DIAGNOSIS — B9689 Other specified bacterial agents as the cause of diseases classified elsewhere: Secondary | ICD-10-CM

## 2023-07-07 MED ORDER — AZITHROMYCIN 250 MG PO TABS
ORAL_TABLET | ORAL | 0 refills | Status: AC
Start: 2023-07-07 — End: 2023-07-12

## 2023-07-07 MED ORDER — MUPIROCIN 2 % EX OINT: 1.0000 | TOPICAL_OINTMENT | Freq: Two times a day (BID) | CUTANEOUS | 0 refills | Status: DC

## 2023-07-07 NOTE — Patient Instructions (Signed)
  Alexandra Taylor, thank you for joining Alexandra Dean, NP for today's virtual visit.  While this provider is not your primary care provider (PCP), if your PCP is located in our provider database this encounter information will be shared with them immediately following your visit.   A [facility] MyChart account gives you access to today's visit and all your visits, tests, and labs performed at [facility] " click here if you don't have a [facility] MyChart account or go to [email or web address]  Consent: (Patient) Alexandra Taylor provided verbal consent for this virtual visit at the beginning of the encounter.  Current Medications:  Current Outpatient Medications:    azithromycin  (ZITHROMAX ) 250 MG tablet, Take 2 tablets on day 1, then 1 tablet daily on days 2 through 5, Disp: 6 tablet, Rfl: 0   mupirocin ointment (BACTROBAN) 2 %, Apply 1 Application topically 2 (two) times daily., Disp: 30 g, Rfl: 0   b complex vitamins tablet, Take 1 tablet by mouth daily., Disp: , Rfl:    BIOTIN 5000 PO, Take by mouth., Disp: , Rfl:    buPROPion  (WELLBUTRIN  XL) 150 MG 24 hr tablet, TAKE 1 TABLET BY MOUTH EVERY DAY, Disp: 90 tablet, Rfl: 1   FLUoxetine  (PROZAC ) 20 MG capsule, TAKE 2 CAPSULES BY MOUTH EVERY DAY, Disp: 180 capsule, Rfl: 1   fluticasone  (FLONASE ) 50 MCG/ACT nasal spray, Place 1 spray into both nostrils as needed., Disp: 1 g, Rfl: 5   GLUCOSAMINE SULFATE PO, Take by mouth daily., Disp: , Rfl:    levothyroxine  (SYNTHROID ) 50 MCG tablet, TAKE 1 TABLET BY MOUTH EVERY DAY BEFORE BREAKFAST, Disp: 90 tablet, Rfl: 1   ramipril  (ALTACE ) 10 MG capsule, TAKE 1 CAPSULE BY MOUTH EVERY DAY, Disp: 90 capsule, Rfl: 1   Medications ordered in this encounter:  Meds ordered this encounter  Medications   azithromycin  (ZITHROMAX ) 250 MG tablet    Sig: Take 2 tablets on day 1, then 1 tablet daily on days 2 through 5    Dispense:  6 tablet    Refill:  0    Supervising Provider:    Corine Dice [[id number]]   mupirocin ointment (BACTROBAN) 2 %    Sig: Apply 1 Application topically 2 (two) times daily.    Dispense:  30 g    Refill:  0    Supervising Provider:   Corine Dice [[id number]]     *If you need refills on other medications prior to your next appointment, please contact your pharmacy*  Follow-Up: Call back or seek an in-person evaluation if the symptoms worsen or if the condition fails to improve as anticipated.  [facility] [phone]   If you have been instructed to have an in-person evaluation today at a local Urgent Care facility, please use the link below. It will take you to a list of all of our available [facility], including address, phone number and hours of operation. Please do not delay care.  [facility]  If you or a family member do not have a primary care provider, use the link below to schedule a visit and establish care. When you choose a [facility] primary care physician or advanced practice provider, you gain a long-term partner in health. Find a Primary Care Provider  Learn more about 's in-office and virtual care options:  - Get Care Now

## 2023-07-07 NOTE — Progress Notes (Signed)
 Virtual Visit Consent   Alexandra Taylor, you are scheduled for a virtual visit with a [facility] provider today. Just as with appointments in the office, your consent must be obtained to participate. Your consent will be active for this visit and any virtual visit you may have with one of our providers in the next 365 days. If you have a MyChart account, a copy of this consent can be sent to you electronically.  As this is a virtual visit, video technology does not allow for your provider to perform a traditional examination. This may limit your provider's ability to fully assess your condition. If your provider identifies any concerns that need to be evaluated in person or the need to arrange testing (such as labs, EKG, etc.), we will make arrangements to do so. Although advances in technology are sophisticated, we cannot ensure that it will always work on either your end or our end. If the connection with a video visit is poor, the visit may have to be switched to a telephone visit. With either a video or telephone visit, we are not always able to ensure that we have a secure connection.  By engaging in this virtual visit, you consent to the provision of healthcare and authorize for your insurance to be billed (if applicable) for the services provided during this visit. Depending on your insurance coverage, you may receive a charge related to this service.  I need to obtain your verbal consent now. Are you willing to proceed with your visit today? Alexandra Taylor has provided verbal consent on 07/07/2023 for a virtual visit (video or telephone). [name], NP  Date: 07/07/2023 9:29 AM   Virtual Visit via Video Note   I, [name], connected with  Alexandra Taylor  ([id number], [birth year]) on 07/07/23 at  9:15 AM EDT by a video-enabled telemedicine application and verified that I am speaking with the correct person using two identifiers.  Location: Patient: Virtual Visit  Location Patient: Home Provider: Virtual Visit Location Provider: Home Office   I discussed the limitations of evaluation and management by telemedicine and the availability of in person appointments. The patient expressed understanding and agreed to proceed.    History of Present Illness: Alexandra Taylor is a 73 y.o. who identifies as a female who was assigned female at birth, and is being seen today for cat scratch.   [name] was scratched on the left side of her face by a feral cat. She currently has a large erythematous laceration on the left side of her cheek and near her left temple. There is no obvious cellulitis or purulent drainage. She denies fever, N/V  Problems:  Patient Active Problem List   Diagnosis Date Noted   Acquired trigger finger of left middle finger 01/12/2022   Benign paroxysmal positional vertigo, bilateral 08/11/2021   Sensorineural hearing loss (SNHL), bilateral 08/11/2021   Encounter for counseling 03/23/2020   Anxiety and depression 01/29/2012   Hypothyroidism 12/05/2010   Attention deficit disorder (ADD) 12/05/2010   Hypertension 12/05/2010    Allergies: No Known Allergies Medications:  Current Outpatient Medications:    azithromycin  (ZITHROMAX ) 250 MG tablet, Take 2 tablets on day 1, then 1 tablet daily on days 2 through 5, Disp: 6 tablet, Rfl: 0   b complex vitamins tablet, Take 1 tablet by mouth daily., Disp: , Rfl:    BIOTIN 5000 PO, Take by mouth., Disp: , Rfl:    buPROPion  (WELLBUTRIN  XL) 150 MG 24  hr tablet, TAKE 1 TABLET BY MOUTH EVERY DAY, Disp: 90 tablet, Rfl: 1   FLUoxetine  (PROZAC ) 20 MG capsule, TAKE 2 CAPSULES BY MOUTH EVERY DAY, Disp: 180 capsule, Rfl: 1   fluticasone  (FLONASE ) 50 MCG/ACT nasal spray, Place 1 spray into both nostrils as needed., Disp: 1 g, Rfl: 5   GLUCOSAMINE SULFATE PO, Take by mouth daily., Disp: , Rfl:    levothyroxine  (SYNTHROID ) 50 MCG tablet, TAKE 1 TABLET BY MOUTH EVERY DAY BEFORE BREAKFAST, Disp: 90  tablet, Rfl: 1   ramipril  (ALTACE ) 10 MG capsule, TAKE 1 CAPSULE BY MOUTH EVERY DAY, Disp: 90 capsule, Rfl: 1  Observations/Objective: Patient is well-developed, well-nourished in no acute distress.  Resting comfortably at home.  Head is normocephalic, atraumatic.  No labored breathing.  Speech is clear and coherent with logical content.  Patient is alert and oriented at baseline.    Assessment and Plan: 1. Skin infection, bacterial (Primary) - azithromycin  (ZITHROMAX ) 250 MG tablet; Take 2 tablets on day 1, then 1 tablet daily on days 2 through 5  Dispense: 6 tablet; Refill: 0  2. Cat scratch - azithromycin  (ZITHROMAX ) 250 MG tablet; Take 2 tablets on day 1, then 1 tablet daily on days 2 through 5  Dispense: 6 tablet; Refill: 0   Follow Up Instructions: I discussed the assessment and treatment plan with the patient. The patient was provided an opportunity to ask questions and all were answered. The patient agreed with the plan and demonstrated an understanding of the instructions.  A copy of instructions were sent to the patient via MyChart unless otherwise noted below.    The patient was advised to call back or seek an in-person evaluation if the symptoms worsen or if the condition fails to improve as anticipated.    Lama Narayanan W Paulo Keimig, NP

## 2023-07-16 ENCOUNTER — Other Ambulatory Visit (INDEPENDENT_AMBULATORY_CARE_PROVIDER_SITE_OTHER)

## 2023-07-16 DIAGNOSIS — R7989 Other specified abnormal findings of blood chemistry: Secondary | ICD-10-CM | POA: Diagnosis not present

## 2023-07-16 LAB — GAMMA GT: GGT: 25 U/L (ref 7–51)

## 2023-07-16 LAB — CBC WITH DIFFERENTIAL/PLATELET
Basophils Absolute: 0 10*3/uL (ref 0.0–0.1)
Basophils Relative: 1 % (ref 0.0–3.0)
Eosinophils Absolute: 0.1 10*3/uL (ref 0.0–0.7)
Eosinophils Relative: 1.8 % (ref 0.0–5.0)
HCT: 41.4 % (ref 36.0–46.0)
Hemoglobin: 13.7 g/dL (ref 12.0–15.0)
Lymphocytes Relative: 32.7 % (ref 12.0–46.0)
Lymphs Abs: 1.5 10*3/uL (ref 0.7–4.0)
MCHC: 33.1 g/dL (ref 30.0–36.0)
MCV: 95.9 fl (ref 78.0–100.0)
Monocytes Absolute: 0.4 10*3/uL (ref 0.1–1.0)
Monocytes Relative: 8 % (ref 3.0–12.0)
Neutro Abs: 2.5 10*3/uL (ref 1.4–7.7)
Neutrophils Relative %: 56.5 % (ref 43.0–77.0)
Platelets: 214 10*3/uL (ref 150.0–400.0)
RBC: 4.32 Mil/uL (ref 3.87–5.11)
RDW: 13.6 % (ref 11.5–15.5)
WBC: 4.5 10*3/uL (ref 4.0–10.5)

## 2023-07-16 LAB — COMPREHENSIVE METABOLIC PANEL WITH GFR
ALT: 33 U/L (ref 0–35)
AST: 38 U/L — ABNORMAL HIGH (ref 0–37)
Albumin: 4.2 g/dL (ref 3.5–5.2)
Alkaline Phosphatase: 150 U/L — ABNORMAL HIGH (ref 39–117)
BUN: 18 mg/dL (ref 6–23)
CO2: 28 meq/L (ref 19–32)
Calcium: 9.5 mg/dL (ref 8.4–10.5)
Chloride: 101 meq/L (ref 96–112)
Creatinine, Ser: 0.84 mg/dL (ref 0.40–1.20)
GFR: 69.63 mL/min (ref >60.00)
Glucose, Bld: 93 mg/dL (ref 70–99)
Potassium: 4.7 meq/L (ref 3.5–5.1)
Sodium: 135 meq/L (ref 135–145)
Total Bilirubin: 0.6 mg/dL (ref 0.2–1.2)
Total Protein: 6.7 g/dL (ref 6.0–8.3)

## 2023-07-16 LAB — PROTIME-INR
INR: 1 ratio (ref 0.8–1.0)
Prothrombin Time: 10.6 s (ref 9.6–13.1)

## 2023-07-17 LAB — IBC + FERRITIN
Ferritin: 226.5 ng/mL (ref 10.0–291.0)
Iron: 120 ug/dL (ref 42–145)
Saturation Ratios: 41 % (ref 20.0–50.0)
TIBC: 292.6 ug/dL (ref 250.0–450.0)
Transferrin: 209 mg/dL — ABNORMAL LOW (ref 212.0–360.0)

## 2023-07-19 ENCOUNTER — Ambulatory Visit: Payer: PPO | Admitting: Gastroenterology

## 2023-07-19 ENCOUNTER — Encounter: Payer: Self-pay | Admitting: Gastroenterology

## 2023-07-19 VITALS — BP 118/68 | HR 80 | Ht 63.0 in | Wt 137.0 lb

## 2023-07-19 DIAGNOSIS — Z800 Family history of malignant neoplasm of digestive organs: Secondary | ICD-10-CM

## 2023-07-19 DIAGNOSIS — R7989 Other specified abnormal findings of blood chemistry: Secondary | ICD-10-CM

## 2023-07-19 NOTE — Patient Instructions (Addendum)
 _______________________________________________________  If your blood pressure at your visit was 140/90 or greater, please contact your primary care physician to follow up on this.  _______________________________________________________  If you are age 72 or older, your body mass index should be between 23-30. Your Body mass index is 24.27 kg/m. If this is out of the aforementioned range listed, please consider follow up with your Primary Care Provider.  If you are age 106 or younger, your body mass index should be between 19-25. Your Body mass index is 24.27 kg/m. If this is out of the aformentioned range listed, please consider follow up with your Primary Care Provider.   ________________________________________________________  The [facility] providers would like to encourage you to use MYCHART to communicate with providers for non-urgent requests or questions.  Due to long hold times on the telephone, sending your provider a message by [facility] may be a faster and more efficient way to get a response.  Please allow 48 business hours for a response.  Please remember that this is for non-urgent requests.  _______________________________________________________  Continue with No alcohol  Your provider has requested that you go to the basement level for lab work after 10-19-23. Press "B" on the elevator. The lab is located at the first door on the left as you exit the elevator.  You have been scheduled for a colonoscopy. Please follow written instructions given to you at your visit today.   If you use inhalers (even only as needed), please bring them with you on the day of your procedure.  DO NOT TAKE 7 DAYS PRIOR TO TEST- Trulicity (dulaglutide) Ozempic, Wegovy (semaglutide) Mounjaro (tirzepatide) Bydureon Bcise (exanatide extended release)  DO NOT TAKE 1 DAY PRIOR TO YOUR TEST Rybelsus (semaglutide) Adlyxin (lixisenatide) Victoza (liraglutide) Byetta  (exanatide) ___________________________________________________________________________  Thank you,  Dr. Lajuan Pila

## 2023-07-19 NOTE — Progress Notes (Signed)
 Chief Complaint: FU  Referring Provider:  Sylvan Evener, MD      ASSESSMENT AND PLAN;   #1. Abn Lfts d/t ETOH liver disease. No fibrosis/cirrhosis on liver Bx 05/2022  #2. FH CRC (brother < 50)  Plan: -No ETOH as she has been doing. -In 12 weeks, CBC, CMP, AMA, alk phos iso enzymes, fib-4 -Colon with mirlax   HPI:    Alexandra Taylor is a 73 y.o. female  Patient of Dr. Savannah Curlin For follow-up See previous note (from [facility] dated 09/08/2022) for details Here for abnormal LFTs Off all alcohol Doing very well.  LFTs getting better.  She is pleased with the progress.  No itching.  No jaundice dark urine or pale stools.  [birth year] today.  Discussed the use of AI scribe software for clinical note transcription with the patient, who gave verbal consent to proceed.  History of Present Illness Alexandra Taylor "Alexandra Taylor" is a 73 year old female who presents for follow-up of liver function tests and family history of colon cancer.  Her liver function tests show improvement, with recent iron studies being negative and normal GGT levels. Alkaline phosphatase levels remain elevated, with recent values of 154 and 150, down from 138. ALT levels are currently at 33 and 38. She consumes a small amount of alcohol occasionally.  She had a colonoscopy on December 11, 2017, and is due for a repeat in five years due to family history. She is uncertain about the specifics of her brothers' medical conditions and will need to ask her family for clarification.  She mentions a slight weight gain, attributing it to increased consumption of sweets, particularly chocolate, which she enjoys. Her weight has increased by about two pounds, from 135 to 137 pounds.  Family history reveals that one of her brothers had some form of cancer, possibly colon cancer, at a younger age.    Wt Readings from Last 3 Encounters:  07/19/23 137 lb (62.1 kg)  04/25/23 135 lb (61.2 kg)  04/12/23 136 lb (61.7  kg)        Latest Ref Rng & Units 07/16/2023    9:44 AM 04/13/2023   11:03 AM 12/22/2022    8:54 AM  Hepatic Function  Total Protein 6.0 - 8.3 g/dL 6.7  6.7  6.5   Albumin 3.5 - 5.2 g/dL 4.2  4.1  4.1   AST 0 - 37 U/L 38  31  34   ALT 0 - 35 U/L 33  24  37   Alk Phosphatase 39 - 117 U/L 150  138  154   Total Bilirubin 0.2 - 1.2 mg/dL 0.6  0.6  0.7   Bilirubin, Direct 0.0 - 0.3 mg/dL  0.1  0.2    Liver/GI WU: -Neg HFE gene -Liver Bx: 06/14/2022 Histiocytic portal and lobular inflammation suggestive of resolved  liver injury. Hepatocellular siderosis. No evidence of fibrosis.  Iron index <1 -Negative ASMA, AFP, acute hepatitis panel -Patient is not immune to hepatitis B -Ferritin 290 (January 2024) -Neg iron studies 06/2023  US  04/2023 No new focal lesion identified. Heterogeneous and overall mildly increased hepatic parenchymal echogenicity. This is nonspecific but can be seen in the setting of underlying hepatocellular disease.  12/11/2017 colonoscopy Dr. Cristina Donath sigmoid diverticulosis otherwise normal TI, repeat 5 years due to family history (11/2022) 10/24/2021 RUQ US  echogenic liver with nodular contour suggestive of chronic liver disease possible cirrhosis Labs 01/27/22 ferritin 353, iron 168 Labs 03/15/22 ferritin 290, iron  96 02/03/2022 MRI showed heterogeneous left signal liver NVC Alexandra Taylor compatible with iron deposition liver, no findings suggestive of cirrhosis, numerous nodular areas T2 signal and relative increased T1 early arterial phase favored to be iron deposition sparing repeat MRI liver 3 months using Eovist  contrast material to reassess these areas. Mild fusiform dilatation of the CBD measures up to 8 mm. No signs of choledocholithiasis. Signs of iron deposition within the spleen are also identified.  Past Medical History:  Diagnosis Date   Hyperlipidemia    Hypertension    Osteopenia     Past Surgical History:  Procedure Laterality Date   hemithyroidectomy       Family History  Problem Relation Age of Onset   Hypertension Mother    Colon cancer Brother    Rectal cancer Neg Hx    Stomach cancer Neg Hx    Esophageal cancer Neg Hx     Social History   Tobacco Use   Smoking status: Former    Current packs/day: 0.00    Average packs/day: 1 pack/day for 15.0 years (15.0 ttl pk-yrs)    Types: Cigarettes    Start date: 02/27/1969    Quit date: 02/28/1984    Years since quitting: 39.4   Smokeless tobacco: Never  Substance Use Topics   Alcohol use: Yes    Comment: Daily   Drug use: No    Current Outpatient Medications  Medication Sig Dispense Refill   b complex vitamins tablet Take 1 tablet by mouth daily.     BIOTIN 5000 PO Take by mouth.     buPROPion  (WELLBUTRIN  XL) 150 MG 24 hr tablet TAKE 1 TABLET BY MOUTH EVERY DAY 90 tablet 1   FLUoxetine  (PROZAC ) 20 MG capsule TAKE 2 CAPSULES BY MOUTH EVERY DAY 180 capsule 1   fluticasone  (FLONASE ) 50 MCG/ACT nasal spray Place 1 spray into both nostrils as needed. 1 g 5   GLUCOSAMINE SULFATE PO Take by mouth daily.     levothyroxine  (SYNTHROID ) 50 MCG tablet TAKE 1 TABLET BY MOUTH EVERY DAY BEFORE BREAKFAST 90 tablet 1   Milk Thistle 1000 MG CAPS Take by mouth. Pt states mg are 1300 mg     mupirocin  ointment (BACTROBAN ) 2 % Apply 1 Application topically 2 (two) times daily. 30 g 0   ramipril  (ALTACE ) 10 MG capsule TAKE 1 CAPSULE BY MOUTH EVERY DAY 90 capsule 1   No current facility-administered medications for this visit.    No Known Allergies  Review of Systems:  neg     Physical Exam:    BP 118/68   Pulse 80   Ht 5\' 3"  (1.6 m)   Wt 137 lb (62.1 kg)   BMI 24.27 kg/m  Wt Readings from Last 3 Encounters:  07/19/23 137 lb (62.1 kg)  04/25/23 135 lb (61.2 kg)  04/12/23 136 lb (61.7 kg)   Constitutional:  Well-developed, in no acute distress. Psychiatric: Normal mood and affect. Behavior is normal. HEENT: Pupils normal.  Conjunctivae are normal. No scleral icterus. Cardiovascular:  Normal rate, regular rhythm. No edema Pulmonary/chest: Effort normal and breath sounds normal. No wheezing, rales or rhonchi. Abdominal: Soft, nondistended. Nontender. Bowel sounds active throughout. There are no masses palpable. No hepatomegaly. Rectal: Deferred Neurological: Alert and oriented to person place and time. Skin: Skin is warm and dry. No rashes noted.  Data Reviewed: I have personally reviewed following labs and imaging studies  CBC:    Latest Ref Rng & Units 07/16/2023    9:44 AM 12/22/2022  8:54 AM 09/08/2022   11:46 AM  CBC  WBC 4.0 - 10.5 K/uL 4.5  3.3  3.5   Hemoglobin 12.0 - 15.0 g/dL 16.1  09.6  04.5   Hematocrit 36.0 - 46.0 % 41.4  40.5  43.9   Platelets 150.0 - 400.0 K/uL 214.0  184.0  191.0     CMP:    Latest Ref Rng & Units 07/16/2023    9:44 AM 04/13/2023   11:03 AM 12/22/2022    8:54 AM  CMP  Glucose 70 - 99 mg/dL 93   86   BUN 6 - 23 mg/dL 18   10   Creatinine 4.09 - 1.20 mg/dL 8.11   9.14   Sodium 782 - 145 mEq/L 135   136   Potassium 3.5 - 5.1 mEq/L 4.7   4.4   Chloride 96 - 112 mEq/L 101   102   CO2 19 - 32 mEq/L 28   24   Calcium 8.4 - 10.5 mg/dL 9.5   9.4   Total Protein 6.0 - 8.3 g/dL 6.7  6.7  6.5   Total Bilirubin 0.2 - 1.2 mg/dL 0.6  0.6  0.7   Alkaline Phos 39 - 117 U/L 150  138  154   AST 0 - 37 U/L 38  31  34   ALT 0 - 35 U/L 33  24  37         Magnus Schuller, MD 07/19/2023, 10:59 AM  Cc: Sylvan Evener, MD

## 2023-07-20 DIAGNOSIS — M7062 Trochanteric bursitis, left hip: Secondary | ICD-10-CM | POA: Diagnosis not present

## 2023-07-21 ENCOUNTER — Ambulatory Visit: Payer: Self-pay | Admitting: Gastroenterology

## 2023-07-29 NOTE — Progress Notes (Signed)
 07/30/23- 50 yoF former smoker for sleep evaluation courtesy of Dr Ronal Norleen Hailstone with concern of Sleep apnea Medical problem list includes HTN, Hypothyroid, Vertigo, Anx/ Depression, ADD,  Epworth sleepiness score-12 Body weight today-134 lbs -----Hard to fall asleep, fatigue when waking in mornings. Restless sleep.  Daytime sleepiness.  Discussed the use of AI scribe software for clinical note transcription with the patient, who gave verbal consent to proceed.  History of Present Illness   Alexandra Taylor is a 73 year old female who presents with sleep disturbances and daytime sleepiness.  She experiences difficulty initiating sleep at night but maintains sleep once asleep. Daytime sleepiness is significant and challenging to manage. Over-the-counter remedies to help sleep such as Tylenol , ashwagandha, and melatonin have not provided sufficient relief. There is a family history of sleep apnea, with several siblings using CPAP machines. Her husband previously noted snoring and suspected sleep apnea, but she currently lacks feedback on her sleep behaviors as she sleeps separately. She denies any history of nasal or throat surgeries, lung or heart problems, and has not been prescribed sleep medications. Occasional sleep talking occurs, but no complex sleep behaviors are present.     Prior to Admission medications   Medication Sig Start Date End Date Taking? Authorizing Provider  b complex vitamins tablet Take 1 tablet by mouth daily.   Yes [provider]  BIOTIN 5000 PO Take by mouth.   Yes [provider]  buPROPion  (WELLBUTRIN  XL) 150 MG 24 hr tablet TAKE 1 TABLET BY MOUTH EVERY DAY 03/23/23  Yes Baxley, Ronal PARAS, MD  FLUoxetine  (PROZAC ) 20 MG capsule TAKE 2 CAPSULES BY MOUTH EVERY DAY 03/23/23  Yes Baxley, Ronal PARAS, MD  GLUCOSAMINE SULFATE PO Take by mouth daily.   Yes [provider]  Milk Thistle 1000 MG CAPS Take by mouth. Pt states mg are 1300 mg   Yes  [provider]  ramipril  (ALTACE ) 10 MG capsule TAKE 1 CAPSULE BY MOUTH EVERY DAY 02/15/23  Yes Baxley, Ronal PARAS, MD  levothyroxine  (SYNTHROID ) 50 MCG tablet TAKE 1 TABLET BY MOUTH EVERY DAY BEFORE BREAKFAST 08/20/23   Hailstone Ronal PARAS, MD   Past Medical History:  Diagnosis Date   Hyperlipidemia    Hypertension    Osteopenia    Past Surgical History:  Procedure Laterality Date   hemithyroidectomy     Family History  Problem Relation Age of Onset   Hypertension Mother    Colon cancer Brother    Rectal cancer Neg Hx    Stomach cancer Neg Hx    Esophageal cancer Neg Hx    Social History   Socioeconomic History   Marital status: Married    Spouse name: Not on file   Number of children: Not on file   Years of education: Not on file   Highest education level: Some college, no degree  Occupational History   Not on file  Tobacco Use   Smoking status: Former    Current packs/day: 0.00    Average packs/day: 1 pack/day for 15.0 years (15.0 ttl pk-yrs)    Types: Cigarettes    Start date: 02/27/1969    Quit date: 02/28/1984    Years since quitting: 39.5   Smokeless tobacco: Never  Substance and Sexual Activity   Alcohol use: Yes    Comment: Daily   Drug use: No   Sexual activity: Yes  Other Topics Concern   Not on file  Social History Narrative   Not on file  Social Drivers of Corporate investment banker Strain: Low Risk  (02/12/2023)   Overall Financial Resource Strain (CARDIA)    Difficulty of Paying Living Expenses: Not hard at all  Food Insecurity: No Food Insecurity (02/12/2023)   Hunger Vital Sign    Worried About Running Out of Food in the Last Year: Never true    Ran Out of Food in the Last Year: Never true  Transportation Needs: No Transportation Needs (02/12/2023)   PRAPARE - Administrator, Civil Service (Medical): No    Lack of Transportation (Non-Medical): No  Physical Activity: Sufficiently Active (02/12/2023)   Exercise Vital Sign     Days of Exercise per Week: 6 days    Minutes of Exercise per Session: 30 min  Stress: No Stress Concern Present (02/12/2023)   [employer] of Occupational Health - Occupational Stress Questionnaire    Feeling of Stress : Only a little  Social Connections: Moderately Integrated (02/12/2023)   Social Connection and Isolation Panel    Frequency of Communication with Friends and Family: Once a week    Frequency of Social Gatherings with Friends and Family: Twice a week    Attends Religious Services: Never    Database administrator or Organizations: Yes    Attends Engineer, structural: More than 4 times per year    Marital Status: Married  Catering manager Violence: Not At Risk (02/12/2023)   Humiliation, Afraid, Rape, and Kick questionnaire    Fear of Current or Ex-Partner: No    Emotionally Abused: No    Physically Abused: No    Sexually Abused: No   Assessment and Plan:   Snoring Suspected Sleep Apnea Family history and anatomical predisposition suggest sleep apnea. Discussed cardiovascular and cognitive risks of untreated sleep apnea. Reviewed treatment options including CPAP and oral appliances. - Order home sleep test to confirm presence and severity of sleep apnea. - Discuss potential treatment options including CPAP and oral appliances if sleep apnea is confirmed.  Insomnia Difficulty initiating sleep with daytime somnolence. Discussed potential overlap with sleep apnea and importance of addressing underlying disorders. - Order home sleep test to evaluate for sleep apnea and assess sleep quality.     ROS-see HPI   + = positive Constitutional:    weight loss, night sweats, fevers, chills, fatigue, lassitude. HEENT:    headaches, difficulty swallowing, tooth/dental problems, sore throat,       sneezing, itching, ear ache, nasal congestion, post nasal drip, snoring CV:    chest pain, orthopnea, PND, swelling in lower extremities, anasarca,                                    dizziness, palpitations Resp:   shortness of breath with exertion or at rest.                productive cough,   non-productive cough, coughing up of blood.              change in color of mucus.  wheezing.   Skin:    rash or lesions. GI:  No-   heartburn, indigestion, abdominal pain, nausea, vomiting, diarrhea,                 change in bowel habits, loss of appetite GU: dysuria, change in color of urine, no urgency or frequency.   flank pain. MS:   joint pain, stiffness, decreased  range of motion, back pain. Neuro-     nothing unusual Psych:  change in mood or affect.  depression or anxiety.   memory loss.  OBJ- Physical Exam General- Alert, Oriented, Affect-appropriate, Distress- none acute Skin- rash-none, lesions- none, excoriation- none Lymphadenopathy- none Head- atraumatic            Eyes- Gross vision intact, PERRLA, conjunctivae and secretions clear            Ears- Hearing, canals-normal            Nose- Clear, no-Septal dev, mucus, polyps, erosion, perforation             Throat- Mallampati II , mucosa clear , drainage- none, tonsils- atrophic Neck- flexible , trachea midline, no stridor , thyroid  nl, carotid no bruit Chest - symmetrical excursion , unlabored           Heart/CV- RRR , no murmur , no gallop  , no rub, nl s1 s2                           - JVD- none , edema- none, stasis changes- none, varices- none           Lung- clear to P&A, wheeze- none, cough- none , dullness-none, rub- none           Chest wall-  Abd-  Br/ Gen/ Rectal- Not done, not indicated Extrem- cyanosis- none, clubbing, none, atrophy- none, strength- nl Neuro- grossly intact to observation

## 2023-07-30 ENCOUNTER — Encounter: Payer: Self-pay | Admitting: Internal Medicine

## 2023-07-30 ENCOUNTER — Ambulatory Visit: Admitting: Internal Medicine

## 2023-07-30 VITALS — BP 114/64 | HR 69 | Temp 98.3°F | Ht 63.5 in | Wt 134.8 lb

## 2023-07-30 DIAGNOSIS — Z87891 Personal history of nicotine dependence: Secondary | ICD-10-CM

## 2023-07-30 DIAGNOSIS — R0683 Snoring: Secondary | ICD-10-CM

## 2023-07-30 NOTE — Patient Instructions (Signed)
 Order- schedule home sleep test dx snoring  Please contact us  about 2 weeks after your sleep test or results and recommendations

## 2023-08-03 DIAGNOSIS — M545 Low back pain, unspecified: Secondary | ICD-10-CM | POA: Diagnosis not present

## 2023-08-03 DIAGNOSIS — M25552 Pain in left hip: Secondary | ICD-10-CM | POA: Diagnosis not present

## 2023-08-12 ENCOUNTER — Encounter

## 2023-08-12 DIAGNOSIS — R0683 Snoring: Secondary | ICD-10-CM

## 2023-08-12 DIAGNOSIS — G4730 Sleep apnea, unspecified: Secondary | ICD-10-CM | POA: Diagnosis not present

## 2023-08-14 DIAGNOSIS — Z85828 Personal history of other malignant neoplasm of skin: Secondary | ICD-10-CM | POA: Diagnosis not present

## 2023-08-14 DIAGNOSIS — Z86007 Personal history of in-situ neoplasm of skin: Secondary | ICD-10-CM | POA: Diagnosis not present

## 2023-08-14 DIAGNOSIS — Z09 Encounter for follow-up examination after completed treatment for conditions other than malignant neoplasm: Secondary | ICD-10-CM | POA: Diagnosis not present

## 2023-08-14 DIAGNOSIS — Z08 Encounter for follow-up examination after completed treatment for malignant neoplasm: Secondary | ICD-10-CM | POA: Diagnosis not present

## 2023-08-14 DIAGNOSIS — L570 Actinic keratosis: Secondary | ICD-10-CM | POA: Diagnosis not present

## 2023-08-14 DIAGNOSIS — D692 Other nonthrombocytopenic purpura: Secondary | ICD-10-CM | POA: Diagnosis not present

## 2023-08-18 ENCOUNTER — Other Ambulatory Visit: Payer: Self-pay | Admitting: Internal Medicine

## 2023-08-18 DIAGNOSIS — Z8639 Personal history of other endocrine, nutritional and metabolic disease: Secondary | ICD-10-CM

## 2023-08-20 ENCOUNTER — Other Ambulatory Visit: Payer: Self-pay | Admitting: Nurse Practitioner

## 2023-08-20 ENCOUNTER — Telehealth: Payer: Self-pay

## 2023-08-20 ENCOUNTER — Encounter: Payer: Self-pay | Admitting: Internal Medicine

## 2023-08-20 DIAGNOSIS — W5503XA Scratched by cat, initial encounter: Secondary | ICD-10-CM

## 2023-08-20 DIAGNOSIS — B9689 Other specified bacterial agents as the cause of diseases classified elsewhere: Secondary | ICD-10-CM

## 2023-08-20 NOTE — Telephone Encounter (Signed)
 Copied from CRM [id number]. Topic: Clinical - Lab/Test Results >> Aug 20, 2023 12:07 PM Isabell A wrote: Reason for CRM: Patient would like to discuss her sleep study results.   Callback number: [phone]   I do not see this in the chart yet. PCC's, have you received this yet?

## 2023-08-20 NOTE — Telephone Encounter (Signed)
 Pt notified results not back yet once we receive them and they are reviewed by provider we will call with results

## 2023-08-20 NOTE — Telephone Encounter (Signed)
 Looked patient up in Snap system shows the study is still being processed so its not ready yet.

## 2023-08-21 DIAGNOSIS — G4733 Obstructive sleep apnea (adult) (pediatric): Secondary | ICD-10-CM | POA: Diagnosis not present

## 2023-08-22 DIAGNOSIS — M25552 Pain in left hip: Secondary | ICD-10-CM | POA: Diagnosis not present

## 2023-09-05 ENCOUNTER — Telehealth: Payer: Self-pay

## 2023-09-05 DIAGNOSIS — G4733 Obstructive sleep apnea (adult) (pediatric): Secondary | ICD-10-CM

## 2023-09-05 NOTE — Telephone Encounter (Signed)
 Copied from CRM [id number]. Topic: Clinical - Order For Equipment >> Sep 04, 2023  3:47 PM Celestine F wrote: Reason for CRM: Pt stated she recently completed a home sleep study and would like to know what the next steps are for treatment. Pt would like to know if she will be obtaining CPAP equipment. Please call the pt back at [phone] ok to leave a vm.    Left message on patients VM.  Patient HST with [employer] is now in EPIC.  Will send message to Dr. Neysa to get result note and then can notify patient.

## 2023-09-07 DIAGNOSIS — M545 Low back pain, unspecified: Secondary | ICD-10-CM | POA: Diagnosis not present

## 2023-09-07 DIAGNOSIS — M25552 Pain in left hip: Secondary | ICD-10-CM | POA: Diagnosis not present

## 2023-09-10 NOTE — Telephone Encounter (Signed)
 Home sleep test showed moderately severe obstructive sleep apnea, averaging 27 apneas/ hour. I recommend we start CPAP.  Order- new DME, new CPAP auto 5-15, mask of choice, heated humidifier, supplies, AirView/ card  She will need a return office visit for CPAP f/u om 31-90 days, per insurance regs.

## 2023-09-11 NOTE — Telephone Encounter (Signed)
 Left message on VM for patient to call clinic to review HST and give recommendations per Dr. Neysa.

## 2023-09-11 NOTE — Telephone Encounter (Signed)
 Copied from CRM [id number]. Topic: Clinical - Lab/Test Results >> Sep 11, 2023 12:52 PM Corean SAUNDERS wrote: Reason for CRM: Patient is requesting nurse Greig Ferries to call her back so that she may get her sleep study results.  ATC x2. Left vm to call back to review HST

## 2023-09-11 NOTE — Telephone Encounter (Signed)
 Copied from CRM [id number]. Topic: Clinical - Lab/Test Results >> Sep 11, 2023  1:53 PM Leila BROCKS wrote: Patient [phone] states missed the nurse call, patient did not check voice message. Please call back.   Called and spoke with [name] regarding HST results. Pt would like to start on cpap.  Order has been placed & pt is aware. Nfn

## 2023-09-13 ENCOUNTER — Encounter: Payer: Self-pay | Admitting: Gastroenterology

## 2023-09-13 ENCOUNTER — Ambulatory Visit: Admitting: Gastroenterology

## 2023-09-13 VITALS — BP 124/66 | HR 63 | Temp 97.9°F | Resp 15 | Ht 63.0 in | Wt 137.0 lb

## 2023-09-13 DIAGNOSIS — K635 Polyp of colon: Secondary | ICD-10-CM

## 2023-09-13 DIAGNOSIS — R7989 Other specified abnormal findings of blood chemistry: Secondary | ICD-10-CM

## 2023-09-13 DIAGNOSIS — Z1211 Encounter for screening for malignant neoplasm of colon: Secondary | ICD-10-CM | POA: Diagnosis not present

## 2023-09-13 DIAGNOSIS — K5730 Diverticulosis of large intestine without perforation or abscess without bleeding: Secondary | ICD-10-CM

## 2023-09-13 DIAGNOSIS — K640 First degree hemorrhoids: Secondary | ICD-10-CM | POA: Diagnosis not present

## 2023-09-13 DIAGNOSIS — D122 Benign neoplasm of ascending colon: Secondary | ICD-10-CM

## 2023-09-13 DIAGNOSIS — I10 Essential (primary) hypertension: Secondary | ICD-10-CM | POA: Diagnosis not present

## 2023-09-13 DIAGNOSIS — Z800 Family history of malignant neoplasm of digestive organs: Secondary | ICD-10-CM | POA: Diagnosis not present

## 2023-09-13 DIAGNOSIS — E785 Hyperlipidemia, unspecified: Secondary | ICD-10-CM | POA: Diagnosis not present

## 2023-09-13 MED ORDER — SODIUM CHLORIDE 0.9 % IV SOLN: 500.0000 mL | Freq: Once | INTRAVENOUS | Status: DC

## 2023-09-13 NOTE — Progress Notes (Signed)
 Sedate, gd SR, tolerated procedure well, VSS, report to RN

## 2023-09-13 NOTE — Progress Notes (Signed)
 Pt's states no medical or surgical changes since previsit or office visit.

## 2023-09-13 NOTE — Patient Instructions (Signed)

## 2023-09-13 NOTE — Op Note (Signed)
 [facility] Patient Name: Alexandra Taylor Procedure Date: 09/13/2023 10:36 AM MRN: [id number] Endoscopist: Lynnie Bring , MD, [id number] Age: 73 Referring MD:  Date of Birth: 08/12/1950 Gender: Female Account #: [id number] Procedure:                Colonoscopy Indications:              Screening in patient at increased risk: Colorectal                            cancer in brother around age 60 Medicines:                Monitored Anesthesia Care Procedure:                Pre-Anesthesia Assessment:                           - Prior to the procedure, a History and Physical                            was performed, and patient medications and                            allergies were reviewed. The patient's tolerance of                            previous anesthesia was also reviewed. The risks                            and benefits of the procedure and the sedation                            options and risks were discussed with the patient.                            All questions were answered, and informed consent                            was obtained. Prior Anticoagulants: The patient has                            taken no anticoagulant or antiplatelet agents. ASA                            Grade Assessment: II - A patient with mild systemic                            disease. After reviewing the risks and benefits,                            the patient was deemed in satisfactory condition to                            undergo the procedure.  After obtaining informed consent, the colonoscope                            was passed under direct vision. Throughout the                            procedure, the patient's blood pressure, pulse, and                            oxygen saturations were monitored continuously. The                            [id number]-HQ190L Colonoscope [id number] was introduced                            through the anus and  advanced to the 2 cm into the                            ileum. The colonoscopy was performed without                            difficulty. The patient tolerated the procedure                            well. The quality of the bowel preparation was                            good. The terminal ileum, ileocecal valve,                            appendiceal orifice, and rectum were photographed. Scope In: 10:46:11 AM Scope Out: 11:03:18 AM Scope Withdrawal Time: 0 hours 11 minutes 52 seconds  Total Procedure Duration: 0 hours 17 minutes 7 seconds  Findings:                 A 6 mm polyp was found in the distal ascending                            colon. The polyp was sessile. The polyp was removed                            with a cold snare. Resection and retrieval were                            complete.                           A few medium-mouthed and small-mouthed diverticula                            were found in the sigmoid colon.                           Internal hemorrhoids were found during  retroflexion. The hemorrhoids were small and Grade                            I (internal hemorrhoids that do not prolapse).                           The terminal ileum appeared normal.                           Retroflexion in the right colon was performed.                           The exam was otherwise without abnormality on                            direct and retroflexion views. Complications:            No immediate complications. Estimated Blood Loss:     Estimated blood loss: none. Impression:               - One 6 mm polyp in the distal ascending colon,                            removed with a cold snare. Resected and retrieved.                           - Mild sigmoid diverticulosis.                           - Internal hemorrhoids.                           - The examined portion of the ileum was normal.                           - The  examination was otherwise normal on direct                            and retroflexion views. Recommendation:           - Patient has a contact number available for                            emergencies. The signs and symptoms of potential                            delayed complications were discussed with the                            patient. Return to normal activities tomorrow.                            Written discharge instructions were provided to the                            patient.                           -  Resume previous diet.                           - Continue present medications.                           - Await pathology results.                           - Repeat colonoscopy for surveillance based on                            pathology results. May not be needed again d/t age.                           - The findings and recommendations were discussed                            with the patient's family. Lynnie Bring, MD 09/13/2023 11:07:54 AM This report has been signed electronically.

## 2023-09-13 NOTE — Progress Notes (Signed)
 [facility] History and Physical   Primary Care Physician:  Perri Ronal PARAS, MD   Reason for Procedure:   FH CRC- brother <60  Plan:    colon     HPI: Alexandra Taylor is a 73 y.o. female    Past Medical History:  Diagnosis Date   Hyperlipidemia    Hypertension    Osteopenia     Past Surgical History:  Procedure Laterality Date   hemithyroidectomy      Prior to Admission medications   Medication Sig Start Date End Date Taking? Authorizing Provider  b complex vitamins tablet Take 1 tablet by mouth daily.   Yes [provider]  BIOTIN 5000 PO Take by mouth.   Yes [provider]  buPROPion  (WELLBUTRIN  XL) 150 MG 24 hr tablet TAKE 1 TABLET BY MOUTH EVERY DAY 03/23/23  Yes Baxley, Ronal PARAS, MD  FLUoxetine  (PROZAC ) 20 MG capsule TAKE 2 CAPSULES BY MOUTH EVERY DAY 03/23/23  Yes Baxley, Ronal PARAS, MD  GLUCOSAMINE SULFATE PO Take by mouth daily.   Yes [provider]  levothyroxine  (SYNTHROID ) 50 MCG tablet TAKE 1 TABLET BY MOUTH EVERY DAY BEFORE BREAKFAST 08/20/23  Yes Baxley, Ronal PARAS, MD  ramipril  (ALTACE ) 10 MG capsule TAKE 1 CAPSULE BY MOUTH EVERY DAY 02/15/23  Yes Baxley, Ronal PARAS, MD  Milk Thistle 1000 MG CAPS Take by mouth. Pt states mg are 1300 mg    [provider]    Current Outpatient Medications  Medication Sig Dispense Refill   b complex vitamins tablet Take 1 tablet by mouth daily.     BIOTIN 5000 PO Take by mouth.     buPROPion  (WELLBUTRIN  XL) 150 MG 24 hr tablet TAKE 1 TABLET BY MOUTH EVERY DAY 90 tablet 1   FLUoxetine  (PROZAC ) 20 MG capsule TAKE 2 CAPSULES BY MOUTH EVERY DAY 180 capsule 1   GLUCOSAMINE SULFATE PO Take by mouth daily.     levothyroxine  (SYNTHROID ) 50 MCG tablet TAKE 1 TABLET BY MOUTH EVERY DAY BEFORE BREAKFAST 90 tablet 1   ramipril  (ALTACE ) 10 MG capsule TAKE 1 CAPSULE BY MOUTH EVERY DAY 90 capsule 1   Milk Thistle 1000 MG CAPS Take by mouth. Pt states mg are 1300 mg     Current Facility-Administered  Medications  Medication Dose Route Frequency Provider Last Rate Last Admin   0.9 %  sodium chloride  infusion  500 mL Intravenous Once Charlanne Groom, MD        Allergies as of 09/13/2023   (No Known Allergies)    Family History  Problem Relation Age of Onset   Hypertension Mother    Colon cancer Brother    Rectal cancer Neg Hx    Stomach cancer Neg Hx    Esophageal cancer Neg Hx     Social History   Socioeconomic History   Marital status: Married    Spouse name: Not on file   Number of children: Not on file   Years of education: Not on file   Highest education level: Some college, no degree  Occupational History   Not on file  Tobacco Use   Smoking status: Former    Current packs/day: 0.00    Average packs/day: 1 pack/day for 15.0 years (15.0 ttl pk-yrs)    Types: Cigarettes    Start date: 02/27/1969    Quit date: 02/28/1984    Years since quitting: 39.5   Smokeless tobacco: Never  Vaping Use   Vaping status: Never Used  Substance and  Sexual Activity   Alcohol use: Yes    Comment: Daily   Drug use: No   Sexual activity: Yes  Other Topics Concern   Not on file  Social History Narrative   Not on file   Social Drivers of Health   Financial Resource Strain: Low Risk  (02/12/2023)   Overall Financial Resource Strain (CARDIA)    Difficulty of Paying Living Expenses: Not hard at all  Food Insecurity: No Food Insecurity (02/12/2023)   Hunger Vital Sign    Worried About Running Out of Food in the Last Year: Never true    Ran Out of Food in the Last Year: Never true  Transportation Needs: No Transportation Needs (02/12/2023)   PRAPARE - Administrator, Civil Service (Medical): No    Lack of Transportation (Non-Medical): No  Physical Activity: Sufficiently Active (02/12/2023)   Exercise Vital Sign    Days of Exercise per Week: 6 days    Minutes of Exercise per Session: 30 min  Stress: No Stress Concern Present (02/12/2023)   [employer] of  Occupational Health - Occupational Stress Questionnaire    Feeling of Stress : Only a little  Social Connections: Moderately Integrated (02/12/2023)   Social Connection and Isolation Panel    Frequency of Communication with Friends and Family: Once a week    Frequency of Social Gatherings with Friends and Family: Twice a week    Attends Religious Services: Never    Database administrator or Organizations: Yes    Attends Engineer, structural: More than 4 times per year    Marital Status: Married  Catering manager Violence: Not At Risk (02/12/2023)   Humiliation, Afraid, Rape, and Kick questionnaire    Fear of Current or Ex-Partner: No    Emotionally Abused: No    Physically Abused: No    Sexually Abused: No    Review of Systems: Positive for none All other review of systems negative except as mentioned in the HPI.  Physical Exam: Vital signs in last 24 hours: @VSRANGES @   General:   Alert,  Well-developed, well-nourished, pleasant and cooperative in NAD Lungs:  Clear throughout to auscultation.   Heart:  Regular rate and rhythm; no murmurs, clicks, rubs,  or gallops. Abdomen:  Soft, nontender and nondistended. Normal bowel sounds.   Neuro/Psych:  Alert and cooperative. Normal mood and affect. A and O x 3    No significant changes were identified.  The patient continues to be an appropriate candidate for the planned procedure and anesthesia.   Anselm Bring, MD. [facility] 09/13/2023 10:28 AM@

## 2023-09-13 NOTE — Progress Notes (Signed)
 Called to room to assist during endoscopic procedure.  Patient ID and intended procedure confirmed with present staff. Received instructions for my participation in the procedure from the performing physician.

## 2023-09-14 ENCOUNTER — Telehealth: Payer: Self-pay

## 2023-09-14 NOTE — Telephone Encounter (Signed)
 Attempted to reach patient for follow up phone call. No answer, left voicemail to contact Dr. Hobert Lull office with any questions or concerns.

## 2023-09-19 LAB — SURGICAL PATHOLOGY

## 2023-09-26 DIAGNOSIS — G4733 Obstructive sleep apnea (adult) (pediatric): Secondary | ICD-10-CM | POA: Diagnosis not present

## 2023-09-27 ENCOUNTER — Ambulatory Visit: Payer: Self-pay | Admitting: Gastroenterology

## 2023-10-05 ENCOUNTER — Other Ambulatory Visit (INDEPENDENT_AMBULATORY_CARE_PROVIDER_SITE_OTHER)

## 2023-10-05 DIAGNOSIS — G4733 Obstructive sleep apnea (adult) (pediatric): Secondary | ICD-10-CM | POA: Diagnosis not present

## 2023-10-05 DIAGNOSIS — R7989 Other specified abnormal findings of blood chemistry: Secondary | ICD-10-CM

## 2023-10-05 LAB — CBC WITH DIFFERENTIAL/PLATELET
Basophils Absolute: 0 K/uL (ref 0.0–0.1)
Basophils Relative: 0.7 % (ref 0.0–3.0)
Eosinophils Absolute: 0.2 K/uL (ref 0.0–0.7)
Eosinophils Relative: 3.1 % (ref 0.0–5.0)
HCT: 39.2 % (ref 36.0–46.0)
Hemoglobin: 13.4 g/dL (ref 12.0–15.0)
Lymphocytes Relative: 20.8 % (ref 12.0–46.0)
Lymphs Abs: 1 K/uL (ref 0.7–4.0)
MCHC: 34.2 g/dL (ref 30.0–36.0)
MCV: 97.8 fl (ref 78.0–100.0)
Monocytes Absolute: 0.5 K/uL (ref 0.1–1.0)
Monocytes Relative: 11.2 % (ref 3.0–12.0)
Neutro Abs: 3.1 K/uL (ref 1.4–7.7)
Neutrophils Relative %: 64.2 % (ref 43.0–77.0)
Platelets: 207 K/uL (ref 150.0–400.0)
RBC: 4 Mil/uL (ref 3.87–5.11)
RDW: 14 % (ref 11.5–15.5)
WBC: 4.8 K/uL (ref 4.0–10.5)

## 2023-10-05 LAB — COMPREHENSIVE METABOLIC PANEL WITH GFR
ALT: 26 U/L (ref 0–35)
AST: 31 U/L (ref 0–37)
Albumin: 4 g/dL (ref 3.5–5.2)
Alkaline Phosphatase: 125 U/L — ABNORMAL HIGH (ref 39–117)
BUN: 11 mg/dL (ref 6–23)
CO2: 27 meq/L (ref 19–32)
Calcium: 8.8 mg/dL (ref 8.4–10.5)
Chloride: 93 meq/L — ABNORMAL LOW (ref 96–112)
Creatinine, Ser: 0.79 mg/dL (ref 0.40–1.20)
GFR: 74.84 mL/min (ref >60.00)
Glucose, Bld: 97 mg/dL (ref 70–99)
Potassium: 4.3 meq/L (ref 3.5–5.1)
Sodium: 127 meq/L — ABNORMAL LOW (ref 135–145)
Total Bilirubin: 0.5 mg/dL (ref 0.2–1.2)
Total Protein: 6.4 g/dL (ref 6.0–8.3)

## 2023-10-09 ENCOUNTER — Other Ambulatory Visit: Payer: PPO

## 2023-10-09 ENCOUNTER — Encounter: Payer: Self-pay | Admitting: Internal Medicine

## 2023-10-09 DIAGNOSIS — Z8639 Personal history of other endocrine, nutritional and metabolic disease: Secondary | ICD-10-CM | POA: Diagnosis not present

## 2023-10-09 LAB — HEPATIC FUNCTION PANEL
AG Ratio: 2 (calc) (ref 1.0–2.5)
ALT: 27 U/L (ref 6–29)
AST: 38 U/L — ABNORMAL HIGH (ref 10–35)
Albumin: 4.4 g/dL (ref 3.6–5.1)
Alkaline phosphatase (APISO): 103 U/L (ref 37–153)
Bilirubin, Direct: 0.2 mg/dL (ref 0.0–0.2)
Globulin: 2.2 g/dL (ref 1.9–3.7)
Indirect Bilirubin: 0.5 mg/dL (ref 0.2–1.2)
Total Bilirubin: 0.7 mg/dL (ref 0.2–1.2)
Total Protein: 6.6 g/dL (ref 6.1–8.1)

## 2023-10-09 LAB — LIPID PANEL
Cholesterol: 227 mg/dL — ABNORMAL HIGH (ref <200)
HDL: 125 mg/dL (ref >=50)
LDL Cholesterol (Calc): 92 mg/dL
Non-HDL Cholesterol (Calc): 102 mg/dL (ref <130)
Total CHOL/HDL Ratio: 1.8 (calc) (ref <5.0)
Triglycerides: 35 mg/dL (ref <150)

## 2023-10-09 LAB — MITOCHONDRIAL ANTIBODIES: Mitochondrial M2 Ab, IgG: <=20 U (ref <=20.0)

## 2023-10-09 LAB — TSH: TSH: 0.75 m[IU]/L (ref 0.40–4.50)

## 2023-10-10 LAB — NASH FIBROSURE(R) PLUS

## 2023-10-11 ENCOUNTER — Ambulatory Visit: Payer: Self-pay | Admitting: Gastroenterology

## 2023-10-11 ENCOUNTER — Ambulatory Visit: Payer: PPO | Admitting: Internal Medicine

## 2023-10-11 ENCOUNTER — Encounter: Payer: Self-pay | Admitting: Internal Medicine

## 2023-10-11 ENCOUNTER — Other Ambulatory Visit: Payer: Self-pay | Admitting: Internal Medicine

## 2023-10-11 ENCOUNTER — Other Ambulatory Visit: Payer: PPO

## 2023-10-11 ENCOUNTER — Other Ambulatory Visit (HOSPITAL_BASED_OUTPATIENT_CLINIC_OR_DEPARTMENT_OTHER)

## 2023-10-11 VITALS — BP 128/80 | Ht 63.5 in | Wt 136.0 lb

## 2023-10-11 DIAGNOSIS — R7989 Other specified abnormal findings of blood chemistry: Secondary | ICD-10-CM

## 2023-10-11 DIAGNOSIS — J22 Unspecified acute lower respiratory infection: Secondary | ICD-10-CM | POA: Diagnosis not present

## 2023-10-11 DIAGNOSIS — H903 Sensorineural hearing loss, bilateral: Secondary | ICD-10-CM

## 2023-10-11 DIAGNOSIS — F418 Other specified anxiety disorders: Secondary | ICD-10-CM | POA: Diagnosis not present

## 2023-10-11 DIAGNOSIS — Z8639 Personal history of other endocrine, nutritional and metabolic disease: Secondary | ICD-10-CM

## 2023-10-11 DIAGNOSIS — G4730 Sleep apnea, unspecified: Secondary | ICD-10-CM

## 2023-10-11 DIAGNOSIS — E039 Hypothyroidism, unspecified: Secondary | ICD-10-CM

## 2023-10-11 DIAGNOSIS — E890 Postprocedural hypothyroidism: Secondary | ICD-10-CM | POA: Diagnosis not present

## 2023-10-11 DIAGNOSIS — Z8659 Personal history of other mental and behavioral disorders: Secondary | ICD-10-CM

## 2023-10-11 DIAGNOSIS — G4733 Obstructive sleep apnea (adult) (pediatric): Secondary | ICD-10-CM

## 2023-10-11 DIAGNOSIS — I10 Essential (primary) hypertension: Secondary | ICD-10-CM

## 2023-10-11 DIAGNOSIS — F1090 Alcohol use, unspecified, uncomplicated: Secondary | ICD-10-CM

## 2023-10-11 DIAGNOSIS — R059 Cough, unspecified: Secondary | ICD-10-CM

## 2023-10-11 LAB — ALKALINE PHOSPHATASE, ISOENZYMES
Alkaline Phosphatase: 154 IU/L — ABNORMAL HIGH (ref 44–121)
BONE FRACTION: 20 % (ref 14–68)
INTESTINAL FRAC.: 55 % — ABNORMAL HIGH (ref 0–18)
LIVER FRACTION: 25 % (ref 18–85)

## 2023-10-11 MED ORDER — METHYLPREDNISOLONE ACETATE 80 MG/ML IJ SUSP
80.0000 mg | Freq: Once | INTRAMUSCULAR | Status: AC
  Administered 2023-10-11: 80 mg via INTRAMUSCULAR

## 2023-10-11 MED ORDER — METHYLPREDNISOLONE 4 MG PO TABS
ORAL_TABLET | ORAL | 0 refills | Status: DC
Start: 2023-10-11 — End: 2023-10-25

## 2023-10-11 MED ORDER — AZITHROMYCIN 250 MG PO TABS
ORAL_TABLET | ORAL | 0 refills | Status: DC
Start: 2023-10-11 — End: 2023-10-25

## 2023-10-11 NOTE — Progress Notes (Addendum)
 Patient Care Team: Perri Ronal PARAS, MD as PCP - General (Internal Medicine)  Visit Date: 10/11/23  Subjective:  Patient PI:Alexandra Taylor,Female DOB:April 01, 1950,73 y.o. FMW:[id number]   73 y.o.Female presents today for 6 month follow-up for Hypertension; Hypothyroidism. Patient has a past medical history of Anxiety/Depression; Snoring. Seen on 04/12/2023, in the interim has been seen at GI, PT and Orthopedics, Dermatology, Pulmonology.  History of Hypertension treated with Ramipril  10 mg daily; Blood Pressure: normotensive today at 128/80.  History of Hyperthyroidism 2004 s/p hemithyroidectomy;  Subsequently Developed Hypothyroidism treated with Levothyroxine  50 mcg daily. 10/09/2023 TSH: 0.75.     History of Anxiety/Depression treated with Prozac  40 mg daily and Wellbutrin -XL 150 mg daily.  History of Snoring with 08/12/2023 Sleep Study finding moderate OSA, AHI (4%) 27.3/ hr. Snoring w/ oxygen desaturation to 77% . She has been prescribed a CPAP for her OSA, which she says that she is adjusting to this device and  thus far is feeling more well rested upon awakening.  She does mention having a  cough described as a tickle in her throat . Currently not taking anti-reflux medications.    Reviewed 10/09/2023 Lipid Panel, compared to 03/2022: Cholesterol 227, elevated from 186; otherwise WNL w/ HDL at 125, LDL 92, and Triglycerides 35. Hepatic Function Panel, compared to 03/2022: AST 38, elevated from 37; otherwise WNL  Mammogram 02/12/2023 normal with repeat recommendation of 2025.  History of Elevated LFTs; Hemosiderosis followed by Dr. Lynnie Bring, GI, who she last saw in-office 5/22. Colonoscopy 09/13/2023 found/removed a 6 mm sessile polyp from the distal ascending colon (per pathology polypoid fragment of benign colonic mucosa with mild hyperplastic change - multiple levels examined); a few Medium and Small- Mouthed Diverticula found in the sigmoid colon; Small, Grade I (do not prolapse)  Internal hemorrhoids; exam was otherwise without abnormality with repeat recommendation of 2030.  Bone Density 08/2013 T-score at Left Femur Neck was -1.4, osteopenic with repeat scheduled 10/11/2023. Osteoarthritis; Polyarthralgia dx'ed 2019 by Rheumatologist w/ normal CCP.  Vaccine Counseling: Due for Influenza; UTD on PNA, Tdap, and Hepatitis C Screening. Past Medical History:  Diagnosis Date   Hyperlipidemia    Hypertension    Osteopenia     Medical/Surgical History Narrative:  Allergic/Intolerant to: No Known Allergies  2022 - Covid-19 in April  2021 - RSV in November  Immunization History  Administered Date(s) Administered    sv, Bivalent, Protein Subunit Rsvpref,pf [place]) 04/20/2023   Influenza Split 01/29/2012   Influenza, High Dose Seasonal PF 04/30/2018   Influenza, Seasonal, Injecte, Preservative Fre 03/08/2023   Influenza,inj,Quad PF,6+ Mos 12/24/2012, 10/25/2018, 01/03/2021, 03/16/2022   Moderna Covid-19 Vaccine Bivalent Booster 59yrs & up 12/05/2020   Moderna Sars-Covid-2 Vaccination 04/11/2019, 05/10/2019, 12/22/2019, 09/15/2020   PNEUMOCOCCAL CONJUGATE-20 05/31/2023   Pneumococcal Conjugate-13 10/06/2016   Pneumococcal Polysaccharide-23 10/08/2017   Tdap 12/28/2008, 10/14/2019   Past Surgical History:  Procedure Laterality Date   hemithyroidectomy      Family History  Problem Relation Age of Onset   Hypertension Mother    Colon cancer Brother    Rectal cancer Neg Hx    Stomach cancer Neg Hx    Esophageal cancer Neg Hx    Family History Narrative: No Family History of Esophageal/Stomach/Rectal Cancer Father, deceased age 32, w/ hx of Dementia and Arthritis, Hands Mother w/ hx of Hypertension, Stroke, Fractured Hip, MI, and Anemia Siblings, 1 Brother w/ hx of Colon Cancer Social History   Social History Narrative   Married. Non-smoker. Enjoys reading.  Review of Systems  Constitutional:  Negative for fever and malaise/fatigue.  HENT:  Negative  for congestion.   Eyes:  Negative for blurred vision.  Respiratory:  Negative for cough and shortness of breath.   Cardiovascular:  Negative for chest pain, palpitations and leg swelling.  Gastrointestinal:  Negative for vomiting.  Musculoskeletal:  Negative for back pain.  Skin:  Negative for rash.  Neurological:  Negative for loss of consciousness and headaches.     Objective:  Vitals: BP 128/80   Ht 5' 3.5 (1.613 m)   Wt 136 lb (61.7 kg)   BMI 23.71 kg/m   Physical Exam Vitals and nursing note reviewed.  Constitutional:      General: She is not in acute distress.    Appearance: Normal appearance. She is not toxic-appearing.  HENT:     Head: Normocephalic and atraumatic.  Pulmonary:     Effort: Pulmonary effort is normal.  Skin:    General: Skin is warm and dry.  Neurological:     Mental Status: She is alert and oriented to person, place, and time. Mental status is at baseline.  Psychiatric:        Mood and Affect: Mood normal.        Behavior: Behavior normal.        Thought Content: Thought content normal.        Judgment: Judgment normal.     Results:  Studies Obtained And Personally Reviewed By Me:  Mammogram 02/12/2023 normal.  Colonoscopy 09/13/2023 found/removed a 6 mm sessile polyp from the distal ascending colon (per pathology polypoid fragment of benign colonic mucosa with mild hyperplastic change - multiple levels examined); a few Medium and Small- Mouthed Diverticula found in the sigmoid colon; Small, Grade I (do not prolapse) Internal hemorrhoids; exam was otherwise without abnormality.  Bone Density 08/2013 T-score at Left Femur Neck was -1.4, osteopenic with repeat scheduled 10/11/2023.   Coronary Calcium Score 11/24/2022 FINDINGS: Coronary arteries: Normal origins.   Coronary Calcium Score:   Left main: 0   Left anterior descending artery: 0   Left circumflex artery: 0   Right coronary artery: 0   Total: 0   Percentile: <1   Pericardium:  Normal.   Ascending Aorta: Normal caliber. Mild descending aortic atherosclerosis.   IMPRESSION: 1. Coronary calcium score of 0. This was <1 percentile for age-, race-, and sex-matched controls.   2.  Mild descending aortic atherosclerosis.  Non-cardiac: OVER-READ INTERPRETATION CT CHEST  FINDINGS: Cardiovascular: Ascending thoracic aorta measures 4 cm. See findings discussed in the body of the report.  Mediastinum/Nodes: No suspicious adenopathy identified. Imaged mediastinal structures are unremarkable.  Lungs/Pleura: Imaged lungs are clear. No pleural effusion or pneumothorax.  Upper Abdomen: No acute abnormality.   Musculoskeletal: No chest wall abnormality. No acute osseous findings. There are thoracic degenerative changes. Beautiful pain pretty.   IMPRESSION: Ectatic ascending thoracic aorta. No acute extracardiac incidental findings identified.  Labs:  CBC w/ Differential Lab Results  Component Value Date   WBC 4.8 10/05/2023   RBC 4.00 10/05/2023   HGB 13.4 10/05/2023   HCT 39.2 10/05/2023   PLT 207 10/05/2023   PLT 207.0 10/05/2023   MCV 97.8 10/05/2023   MCH 33.2 06/14/2022   MCHC 34.2 10/05/2023   RDW 14.0 10/05/2023   MPV 10.7 06/06/2022   LYMPHSABS 1.0 10/05/2023   MONOABS 0.5 10/05/2023   BASOSABS 0.0 10/05/2023    Comprehensive Metabolic Panel Lab Results  Component Value Date   NA 127 (L)  10/05/2023   K 4.3 10/05/2023   CL 93 (L) 10/05/2023   CO2 27 10/05/2023   GLUCOSE 97 10/05/2023   BUN 11 10/05/2023   CREATININE 0.79 10/05/2023   CALCIUM 8.8 10/05/2023   PROT 6.6 10/09/2023   ALBUMIN 4.0 10/05/2023   AST 38 (H) 10/09/2023   ALT 27 10/09/2023   ALKPHOS 154 (H) 10/05/2023   ALKPHOS 125 (H) 10/05/2023   BILITOT 0.7 10/09/2023   GFR 74.84 10/05/2023   EGFR 64 10/11/2021   GFRNONAA >60 03/15/2022   Lipid Panel  Lab Results  Component Value Date   CHOL 227 (H) 10/09/2023   HDL 125 10/09/2023   LDLCALC 92 10/09/2023   TRIG 35  10/09/2023   TSH Lab Results  Component Value Date   TSH 0.75 10/09/2023   Assessment & Plan:   Meds ordered this encounter  Medications   methylPREDNISolone  acetate (DEPO-MEDROL ) injection 80 mg   azithromycin  (ZITHROMAX  Z-PAK) 250 MG tablet    Sig: Take 2 tablets (500 mg) on  Day 1,  followed by 1 tablet (250 mg) once daily on Days 2 through 5.    Dispense:  6 each    Refill:  0   methylPREDNISolone  (MEDROL ) 4 MG tablet    Sig: Take in tapering course as directed . Start with 6 tabs on day 1 and decrease by one tab daily 6-5-4-3-2-1    Dispense:  21 tablet    Refill:  0   Hypertension treated with Ramipril  10 mg daily; Blood Pressure: normotensive today at 128/80.  Hyperthyroidism 2004 s/p hemithyroidectomy; Hypothyroidism treated with Levothyroxine  50 mcg daily. 10/09/2023 TSH: 0.75.     Anxiety/Depression treated with Prozac  40 mg daily and Wellbutrin -XL 150 mg daily.  Obstructive Sleep Apnea with 08/12/2023 Sleep Study finding moderate OSA, AHI (4%) 27.3/ hr. Snoring w/ oxygen desaturation to a nadir of 77% and time w/ O2 saturation 88% or less was 7 minutes. She has been prescribed a CPAP for her OSA, which she says that she is acclimating to, but thus far is feeling more well rested upon awakening.  Cough; Acute Lower Respiratory Infection: reportedly has noticed a tickling cough occasionally creeps up on her; denies taking any anti-reflux medications.  Likely silent reflux and I recommended OTC PPI, however prophylactically given Depo-Medrol  80 mg IM today in-office. Sending in 250 mg Azithromycin  - take 2 tablets on Day 1 and 1 tablet on Days 2-5 and 4 mg Medrol  tapering course 6-5-4-3-2-1.      High Serum High Density Lipoprotein (HDL): 10/09/2023 Lipid Panel, compared to 03/2022: Cholesterol 227, elevated from 186; otherwise WNL w/ HDL at 125, LDL 92, and Triglycerides 35.   Mammogram 02/12/2023 normal with repeat recommendation of 2025.  History of Elevated LFTs w/  10/09/2023 Hepatic Function Panel, compared to 03/2022: AST 38, elevated from 37; otherwise WNL.  Hemosiderosis followed by Dr. Lynnie Bring, GI, whom she last saw in-office 5/22  . Colonoscopy 09/13/2023 found/removed a 6 mm sessile polyp from the distal ascending colon (per pathology polypoid fragment of benign colonic mucosa with mild hyperplastic change - multiple levels examined); a few Medium and Small- Mouthed Diverticula found in the sigmoid colon; Small, Grade I  Internal hemorrhoids; exam was otherwise without abnormality with repeat recommendation of 2030.   Bone Density 08/2013 T-score at Left Femur Neck was -1.4, osteopenic with repeat scheduled 10/11/2023. Osteoarthritis; Polyarthralgia dx'ed 2019 by Rheumatologist w/ normal CCP.  Vaccine Counseling: Due for Influenza; UTD on PNA, Tdap, and Hepatitis C  Screening.  Return in 4 months (on 02/12/2024) for labs and then on 02/15/2024 for OV, or as needed.   I,Emily Lagle,acting as a Neurosurgeon for Ronal JINNY Hailstone, MD.,have documented all relevant documentation on the behalf of Ronal JINNY Hailstone, MD,as directed by  Ronal JINNY Hailstone, MD while in the presence of Ronal JINNY Hailstone, MD.  I, Ronal JINNY Hailstone, MD, have reviewed all documentation for this visit. The documentation on 10/11/2023 for the exam, diagnosis, procedures, and orders are all accurate and complete.

## 2023-10-16 LAB — NASH FIBROSURE(R) PLUS
ALPHA 2-MACROGLOBULINS, QN: 310 mg/dL — AB (ref 110–276)
ALT (SGPT) P5P: 31 IU/L (ref 0–40)
AST (SGOT) P5P: 46 IU/L — AB (ref 0–40)
Apolipoprotein A-1: 235 mg/dL — AB (ref 116–209)
Bilirubin, Total: <0.2 mg/dL (ref 0.0–1.2)
Cholesterol, Total: 265 mg/dL — AB (ref 100–199)
Fibrosis Score: 0.12 (ref 0.00–0.21)
GGT: 19 IU/L (ref 0–60)
Glucose: 113 mg/dL — AB (ref 70–99)
Haptoglobin: 126 mg/dL (ref 42–346)
NASH Score: 0 (ref 0.00–0.25)
Steatosis Score: 0.19 (ref 0.00–0.40)
Triglycerides: 50 mg/dL (ref 0–149)

## 2023-10-16 LAB — FIB-4 W/RX NASH FIBROSURE PLUS
ALT: 28 IU/L (ref 0–32)
AST: 35 IU/L (ref 0–40)
FIB-4 Index: 2.3 (ref 0.00–2.67)
Platelets: 207 x10E3/uL (ref 150–450)

## 2023-10-19 NOTE — Progress Notes (Signed)
 Fibrosis score-0.12 (means no fibrosis), steatosis score 0.19 (means no significant steatosis) Alkaline phosphatase-154 with 55% intestinal fraction-can happen if not fasting.  Also can happen with celiac or any intestinal problems  Plan: - Lets check celiac serology to be on the safer side (in 2 weeks) - Repeat LFTs, alkaline phosphatase isoenzymes fasting in 2 weeks  - For elevated cholesterol, please get in touch with Dr. Perri  All else seems to be normal. Send report to family physician

## 2023-10-21 NOTE — Patient Instructions (Addendum)
 Lipid panel is stable with high HDL(good) cholesterol. You have been diagnosed with an acute lower respiratory infection today. Please take Zithromax  Z pak and Medrol  dosepak as directed. Hx of hemosiderosis seen by Dr. Charlanne at [facility]. Medicare annual wellness visit  and labs are scheduled for December.

## 2023-10-23 ENCOUNTER — Encounter: Payer: Self-pay | Admitting: Internal Medicine

## 2023-10-23 ENCOUNTER — Telehealth: Payer: Self-pay | Admitting: Internal Medicine

## 2023-10-23 ENCOUNTER — Encounter: Payer: Self-pay | Admitting: Gastroenterology

## 2023-10-24 ENCOUNTER — Other Ambulatory Visit: Payer: Self-pay

## 2023-10-24 DIAGNOSIS — R748 Abnormal levels of other serum enzymes: Secondary | ICD-10-CM

## 2023-10-24 DIAGNOSIS — R7989 Other specified abnormal findings of blood chemistry: Secondary | ICD-10-CM

## 2023-10-24 NOTE — Telephone Encounter (Signed)
 Pt made aware of recent results and Dr. Charlanne recommendations.  Orders for labs placed in Epic. Pt made aware to return in two weeks fasting. Location provide. Report was sent to pt PCP.  Pt verbalized understanding with all questions answered.

## 2023-10-25 ENCOUNTER — Ambulatory Visit
Admission: RE | Admit: 2023-10-25 | Discharge: 2023-10-25 | Disposition: A | Discharge status: Home / Self Care | Source: Ambulatory Visit | Attending: Internal Medicine | Admitting: Internal Medicine

## 2023-10-25 ENCOUNTER — Encounter: Payer: Self-pay | Admitting: Internal Medicine

## 2023-10-25 ENCOUNTER — Other Ambulatory Visit: Payer: Self-pay | Admitting: Internal Medicine

## 2023-10-25 ENCOUNTER — Ambulatory Visit (INDEPENDENT_AMBULATORY_CARE_PROVIDER_SITE_OTHER): Admitting: Internal Medicine

## 2023-10-25 ENCOUNTER — Ambulatory Visit: Payer: Self-pay | Admitting: Internal Medicine

## 2023-10-25 VITALS — BP 110/70 | HR 77 | Temp 99.5°F | Ht 63.5 in | Wt 136.0 lb

## 2023-10-25 DIAGNOSIS — G4733 Obstructive sleep apnea (adult) (pediatric): Secondary | ICD-10-CM | POA: Diagnosis not present

## 2023-10-25 DIAGNOSIS — R059 Cough, unspecified: Secondary | ICD-10-CM

## 2023-10-25 DIAGNOSIS — R0602 Shortness of breath: Secondary | ICD-10-CM | POA: Diagnosis not present

## 2023-10-25 DIAGNOSIS — R053 Chronic cough: Secondary | ICD-10-CM | POA: Diagnosis not present

## 2023-10-25 MED ORDER — BUDESONIDE-FORMOTEROL FUMARATE 160-4.5 MCG/ACT IN AERO: 2.0000 | INHALATION_SPRAY | Freq: Two times a day (BID) | RESPIRATORY_TRACT | 3 refills | Status: DC

## 2023-10-25 MED ORDER — PANTOPRAZOLE SODIUM 40 MG PO TBEC: 40.0000 mg | DELAYED_RELEASE_TABLET | Freq: Every day | ORAL | 0 refills | Status: AC

## 2023-10-25 MED ORDER — BENZONATATE 100 MG PO CAPS: 100.0000 mg | ORAL_CAPSULE | Freq: Three times a day (TID) | ORAL | 0 refills | Status: DC | PRN

## 2023-10-25 MED ORDER — AZITHROMYCIN 250 MG PO TABS: ORAL_TABLET | ORAL | 0 refills | Status: AC

## 2023-10-25 MED ORDER — FLUTICASONE-SALMETEROL 100-50 MCG/ACT IN AEPB: 1.0000 | INHALATION_SPRAY | Freq: Two times a day (BID) | RESPIRATORY_TRACT | 3 refills | Status: DC

## 2023-10-25 MED ORDER — FLUTICASONE-SALMETEROL 230-21 MCG/ACT IN AERO: 2.0000 | INHALATION_SPRAY | Freq: Two times a day (BID) | RESPIRATORY_TRACT | 12 refills | Status: DC

## 2023-10-25 NOTE — Telephone Encounter (Signed)
 Symbicort  not covered by insurance pharmacy is requesting something else.

## 2023-10-25 NOTE — Progress Notes (Addendum)
 Patient Care Team: Perri Ronal PARAS, MD as PCP - General (Internal Medicine)  Visit Date: 10/25/23  Subjective:   Chief Complaint  Patient presents with   Cough   Sinusitis   persistent symptoms   Vitals:   10/25/23 1456  BP: 110/70   Patient PI:Alexandra Taylor,Female DOB:[birth year],73 y.o. FMW:[id number]   73 y.o.Female presents today for acute  visit with Cough. Patient has a past medical history of HLD, HTN, Hypothyroidism; Anxiety/Depression. On 8/14, when she was seen for her annual exam, she mentioned having a cough described as a tickle in her throat and at the time was not taking anti-reflux medications.     On 10/11/2022 she was seen for an acute lower respiratory infection and prescribed Medrol  4 mg in a tapering. course  and azithromycin  250 mg to take 2 tabs on day one and 1 tab on days two through five.   She has had a non-productive cough for the past couple of weeks and this morning she woke up sneezing and had a runny nose. She recently received  CPAP machine prescribed by Dr. Neysa.   Have ordered a chest X-ray at [facility]. Today,She was prescribed Advair 100-50 mc 2 puffs daily.  Symbicort  is not covered by her insurance. She was also prescribed Protonix  40 mg daily for acid reflux. Because she is traveling to [place] in the next two weeks, she was also given azithromycin  250mg  with directions to take 2 pills on the first day then 1 pill on days 2 through five and Tessalon   perles 100 mg  to take 1 capsule by mouth three times daily as needed for cough. May need Allergy evaluation if cough persists.  Past Medical History:  Diagnosis Date   Hyperlipidemia    Hypertension    Osteopenia   No Known Allergies Immunization History  Administered Date(s) Administered    sv, Bivalent, Protein Subunit Rsvpref,pf (Abrysvo) 04/20/2023   INFLUENZA, HIGH DOSE SEASONAL PF 04/30/2018   Influenza Split 01/29/2012   Influenza, Seasonal, Injecte, Preservative Fre  03/08/2023   Influenza,inj,Quad PF,6+ Mos 12/24/2012, 10/25/2018, 01/03/2021, 03/16/2022   Moderna Covid-19 Vaccine Bivalent Booster 80yrs & up 12/05/2020   Moderna Sars-Covid-2 Vaccination 04/11/2019, 05/10/2019, 12/22/2019, 09/15/2020   PNEUMOCOCCAL CONJUGATE-20 05/31/2023   Pneumococcal Conjugate-13 10/06/2016   Pneumococcal Polysaccharide-23 10/08/2017   Tdap 12/28/2008, 10/14/2019   Past Surgical History:  Procedure Laterality Date   hemithyroidectomy      Family History  Problem Relation Age of Onset   Hypertension Mother    Colon cancer Brother    Rectal cancer Neg Hx    Stomach cancer Neg Hx    Esophageal cancer Neg Hx    Social History   Social History Narrative   Married. Non-smoker. Enjoys reading.    Review of Systems  Constitutional:  Negative for chills, fever and malaise/fatigue.  HENT:  Negative for congestion.   Eyes:  Negative for blurred vision.  Respiratory:  Positive for cough (non-productive). Negative for shortness of breath.   Cardiovascular:  Negative for chest pain, palpitations and leg swelling.  Gastrointestinal:  Negative for vomiting.  Musculoskeletal:  Negative for back pain.  Skin:  Negative for rash.  Neurological:  Negative for loss of consciousness and headaches.     Objective:  Vitals: BP 110/70   Pulse 77   Temp 99.5 F (37.5 C)   Ht 5' 3.5 (1.613 m)   Wt 136 lb (61.7 kg)   SpO2 98%   BMI 23.71 kg/m  Physical Exam Vitals and nursing note reviewed.  Constitutional:      General: She is not in acute distress.    Appearance: Normal appearance. She is not toxic-appearing.  HENT:     Head: Normocephalic and atraumatic.  Pulmonary:     Effort: Pulmonary effort is normal.     Breath sounds: Normal breath sounds.  Skin:    General: Skin is warm and dry.  Neurological:     Mental Status: She is alert and oriented to person, place, and time. Mental status is at baseline.  Psychiatric:        Mood and Affect: Mood normal.         Behavior: Behavior normal.        Thought Content: Thought content normal.        Judgment: Judgment normal.     Results:  Studies Obtained And Personally Reviewed By Me:  Addendum 5:23 PM 8/28: CXR negative for acute cardiopulmonary process.  Labs:  CBC w/ Differential Lab Results  Component Value Date   WBC 4.8 10/05/2023   RBC 4.00 10/05/2023   HGB 13.4 10/05/2023   HCT 39.2 10/05/2023   PLT 207 10/05/2023   PLT 207.0 10/05/2023   MCV 97.8 10/05/2023   MCH 33.2 06/14/2022   MCHC 34.2 10/05/2023   RDW 14.0 10/05/2023   MPV 10.7 06/06/2022   LYMPHSABS 1.0 10/05/2023   MONOABS 0.5 10/05/2023   BASOSABS 0.0 10/05/2023    Comprehensive Metabolic Panel Lab Results  Component Value Date   NA 127 (L) 10/05/2023   K 4.3 10/05/2023   CL 93 (L) 10/05/2023   CO2 27 10/05/2023   GLUCOSE 97 10/05/2023   BUN 11 10/05/2023   CREATININE 0.79 10/05/2023   CALCIUM 8.8 10/05/2023   PROT 6.6 10/09/2023   ALBUMIN 4.0 10/05/2023   AST 38 (H) 10/09/2023   ALT 27 10/09/2023   ALKPHOS 154 (H) 10/05/2023   ALKPHOS 125 (H) 10/05/2023   BILITOT 0.7 10/09/2023   GFR 74.84 10/05/2023   EGFR 64 10/11/2021   GFRNONAA >60 03/15/2022   Lipid Panel  Lab Results  Component Value Date   CHOL 227 (H) 10/09/2023   HDL 125 10/09/2023   LDLCALC 92 10/09/2023   TRIG 35 10/09/2023   TSH Lab Results  Component Value Date   TSH 0.75 10/09/2023   Assessment & Plan:   Orders Placed This Encounter  Procedures   DG Chest 2 View    Reason for Exam (SYMPTOM  OR DIAGNOSIS REQUIRED):   cough for several weeks, no fever or chills    Preferred imaging location?:   [facility]    Meds ordered this encounter  Medications   DISCONTD: budesonide -formoterol  (SYMBICORT ) 160-4.5 MCG/ACT inhaler    Sig: Inhale 2 puffs into the lungs 2 (two) times daily.    Dispense:  1 each    Refill:  3   pantoprazole  (PROTONIX ) 40 MG tablet    Sig: Take 1 tablet (40 mg total) by mouth daily.     Dispense:  90 tablet    Refill:  0   azithromycin  (ZITHROMAX ) 250 MG tablet    Sig: Take 2 tablets on day 1, then 1 tablet daily on days 2 through 5    Dispense:  6 tablet    Refill:  0   benzonatate  (TESSALON ) 100 MG capsule    Sig: Take 1 capsule (100 mg total) by mouth 3 (three) times daily as needed.    Dispense:  30 capsule  Refill:  0   DISCONTD: fluticasone -salmeterol (ADVAIR) 100-50 MCG/ACT AEPB    Sig: Inhale 1 puff into the lungs 2 (two) times daily.    Dispense:  14 each    Refill:  3   fluticasone -salmeterol (ADVAIR HFA) 230-21 MCG/ACT inhaler    Sig: Inhale 2 puffs into the lungs 2 (two) times daily.    Dispense:  8 each    Refill:  12    Cough: On 10/11/2022 she was seen for an acute lower respiratory infection and prescribed Medrol  4 mg in a tapering course and azithromycin  250 mg 2 tab on day one and 1 tab on days two through five. She said that she has had a non-productive cough for the past couple of weeks and says this morning she woke up sneezing and had a runny nose. She recently got received CPAP device prescribed  by Dr. Neysa.  Today have ordered  chest X-ray at [facility], which showed no active disease - will contact patient with results.   She was prescribed Advair inhaler 230-21 to use twice daily She was also prescribed Protonix  40 mg daily for  possible silent  reflux.   She was also given Azithromycin  250  to take 2 tabs on Day 1 then 1  tablet on days two through five to treat  for bacterial lower respiratory infection. She was also prescribed Tessalon  100 mg 1 capsule by mouth three times daily as needed for cough Consider referral to Allergist if symptoms persist  Addendum: CXR is normal     I,Alexandra Taylor,acting as a scribe for Ronal JINNY Hailstone, MD.,have documented all relevant documentation on the behalf of Ronal JINNY Hailstone, MD,as directed by  Ronal JINNY Hailstone, MD while in the presence of Ronal JINNY Hailstone, MD.  I, Ronal JINNY Hailstone, MD, have reviewed all  documentation for this visit. The documentation on 10/25/2023 for the exam, diagnosis, procedures, and orders are all accurate and complete.

## 2023-10-26 ENCOUNTER — Telehealth: Payer: Self-pay | Admitting: Internal Medicine

## 2023-10-26 NOTE — Telephone Encounter (Signed)
 Copied from CRM [id number]. Topic: Clinical - Prescription Issue >> Oct 26, 2023 10:41 AM Deleta RAMAN wrote: Reason for CRM: patient is calling about medication being two different medicines. She states the symbicort  and medication she receives does not provide the same value. She is requesting advair if possible

## 2023-10-27 DIAGNOSIS — G4733 Obstructive sleep apnea (adult) (pediatric): Secondary | ICD-10-CM | POA: Diagnosis not present

## 2023-10-28 NOTE — Patient Instructions (Addendum)
 Have ordered CXR at [facility].  Insurance does not cover Symbicort  which you have had previously.  Have prescribed Advair inhaler to use twice daily.  Please take Protonix  40 mg daily

## 2023-11-01 ENCOUNTER — Ambulatory Visit: Admitting: Internal Medicine

## 2023-11-05 DIAGNOSIS — G4733 Obstructive sleep apnea (adult) (pediatric): Secondary | ICD-10-CM | POA: Diagnosis not present

## 2023-11-05 NOTE — Progress Notes (Unsigned)
 HPI F followed for OSA, complicated by HTN, Hypothyroid, Vertigo, Anx/ Depression, ADD,  HST(SNAP) 08/12/23- AHI 27.3/hr, desaturation to 77%, body weight 135 lbs  ===================================================================================================================   07/30/23- 73 yoF former smoker for sleep evaluation courtesy of Dr Ronal Rush Baxley with concern of Sleep apnea Medical problem list includes HTN, Hypothyroid, Vertigo, Anx/ Depression, ADD,  Epworth sleepiness score-12 Body weight today-134 lbs -----Hard to fall asleep, fatigue when waking in mornings. Restless sleep.  Daytime sleepiness.  Discussed the use of AI scribe software for clinical note transcription with the patient, who gave verbal consent to proceed.  History of Present Illness   Alexandra Taylor is a 73 year old female who presents with sleep disturbances and daytime sleepiness.  She experiences difficulty initiating sleep at night but maintains sleep once asleep. Daytime sleepiness is significant and challenging to manage. Over-the-counter remedies to help sleep such as Tylenol , ashwagandha, and melatonin have not provided sufficient relief. There is a family history of sleep apnea, with several siblings using CPAP machines. Her husband previously noted snoring and suspected sleep apnea, but she currently lacks feedback on her sleep behaviors as she sleeps separately. She denies any history of nasal or throat surgeries, lung or heart problems, and has not been prescribed sleep medications. Occasional sleep talking occurs, but no complex sleep behaviors are present.      Assessment and Plan:   Snoring Suspected Sleep Apnea Family history and anatomical predisposition suggest sleep apnea. Discussed cardiovascular and cognitive risks of untreated sleep apnea. Reviewed treatment options including CPAP and oral appliances. - Order home sleep test to confirm presence and severity of sleep  apnea. - Discuss potential treatment options including CPAP and oral appliances if sleep apnea is confirmed.  Insomnia Difficulty initiating sleep with daytime somnolence. Discussed potential overlap with sleep apnea and importance of addressing underlying disorders. - Order home sleep test to evaluate for sleep apnea and assess sleep quality.     11/06/23- 73 yoF followed for OSA, complicated by HTN, Hypothyroid, Vertigo, Anx/ Depression, ADD,  HST(SNAP) 08/12/23- AHI 27.3/hr, desaturation to 77%, body weight 135 lbs CPAP auto 5-15/ [facility]    new order 09/11/23 Download compliance-93%, AHI 9.5/hr Body weight today-135 lbs Discussed the use of AI scribe software for clinical note transcription with the patient, who gave verbal consent to proceed.  History of Present Illness   Alexandra Taylor is a 73 year old female who presents for follow-up regarding her CPAP therapy for sleep apnea.  She consistently uses CPAP therapy with nasal pillows and finds them comfortable. Her daytime function and sleep quality have improved since starting CPAP therapy. She is meeting her usage goals. There is an issue with supply delivery due to pending confirmation of CPAP usage for insurance purposes. We reviewed download and will increase the autopap range for better control.     Assessment and Plan:    Obstructive sleep apnea Managed with CPAP therapy. Reports improved alertness and positive experience. We can change settings allowing up to 20 cm H2O. Comfortable with nasal pillows. Supply issues with [facility] contingent on usage confirmation. - Adjust CPAP settings to 20 cm H2O. - Advise contacting [facility] for supply issues and consider in-person appointment. - Confirm CPAP usage and benefit for supply delivery.      ROS-see HPI   + = positive Constitutional:    weight loss, night sweats, fevers, chills, fatigue, lassitude. HEENT:    headaches, difficulty swallowing, tooth/dental problems,  sore throat,  sneezing, itching, ear ache, nasal congestion, post nasal drip, snoring CV:    chest pain, orthopnea, PND, swelling in lower extremities, anasarca,                                   dizziness, palpitations Resp:   shortness of breath with exertion or at rest.                productive cough,   non-productive cough, coughing up of blood.              change in color of mucus.  wheezing.   Skin:    rash or lesions. GI:  No-   heartburn, indigestion, abdominal pain, nausea, vomiting, diarrhea,                 change in bowel habits, loss of appetite GU: dysuria, change in color of urine, no urgency or frequency.   flank pain. MS:   joint pain, stiffness, decreased range of motion, back pain. Neuro-     nothing unusual Psych:  change in mood or affect.  depression or anxiety.   memory loss.  OBJ- Physical Exam General- Alert, Oriented, Affect-appropriate, Distress- none acute Skin- rash-none, lesions- none, excoriation- none Lymphadenopathy- none Head- atraumatic            Eyes- Gross vision intact, PERRLA, conjunctivae and secretions clear            Ears- Hearing, canals-normal            Nose- Clear, no-Septal dev, mucus, polyps, erosion, perforation             Throat- Mallampati II , mucosa clear , drainage- none, tonsils- atrophic Neck- flexible , trachea midline, no stridor , thyroid  nl, carotid no bruit Chest - symmetrical excursion , unlabored           Heart/CV- RRR , no murmur , no gallop  , no rub, nl s1 s2                           - JVD- none , edema- none, stasis changes- none, varices- none           Lung- clear to P&A, wheeze- none, cough- none , dullness-none, rub- none           Chest wall-  Abd-  Br/ Gen/ Rectal- Not done, not indicated Extrem- cyanosis- none, clubbing, none, atrophy- none, strength- nl Neuro- grossly intact to observation

## 2023-11-06 ENCOUNTER — Encounter: Payer: Self-pay | Admitting: Internal Medicine

## 2023-11-06 ENCOUNTER — Ambulatory Visit: Admitting: Internal Medicine

## 2023-11-06 VITALS — BP 130/70 | HR 68 | Temp 98.2°F | Ht 63.0 in | Wt 135.0 lb

## 2023-11-06 DIAGNOSIS — G4700 Insomnia, unspecified: Secondary | ICD-10-CM

## 2023-11-06 DIAGNOSIS — G4733 Obstructive sleep apnea (adult) (pediatric): Secondary | ICD-10-CM

## 2023-11-06 NOTE — Patient Instructions (Signed)
 Order- DME [facility] - please change autopap range to 5-20, continue mask of choice, humidifier, supplies, AirView/ card

## 2023-11-07 ENCOUNTER — Other Ambulatory Visit: Payer: Self-pay | Admitting: Internal Medicine

## 2023-11-15 ENCOUNTER — Other Ambulatory Visit: Payer: Self-pay | Admitting: Internal Medicine

## 2023-11-27 DIAGNOSIS — G4733 Obstructive sleep apnea (adult) (pediatric): Secondary | ICD-10-CM | POA: Diagnosis not present

## 2023-12-04 ENCOUNTER — Encounter: Payer: Self-pay | Admitting: Internal Medicine

## 2023-12-05 DIAGNOSIS — M5431 Sciatica, right side: Secondary | ICD-10-CM | POA: Diagnosis not present

## 2023-12-06 ENCOUNTER — Encounter: Payer: Self-pay | Admitting: Internal Medicine

## 2023-12-06 ENCOUNTER — Ambulatory Visit: Admitting: Internal Medicine

## 2023-12-06 VITALS — BP 120/80 | HR 70 | Ht 63.0 in | Wt 140.0 lb

## 2023-12-06 DIAGNOSIS — M5431 Sciatica, right side: Secondary | ICD-10-CM | POA: Diagnosis not present

## 2023-12-06 MED ORDER — HYDROCODONE-ACETAMINOPHEN 5-325 MG PO TABS: 1.0000 | ORAL_TABLET | Freq: Four times a day (QID) | ORAL | 0 refills | Status: DC | PRN

## 2023-12-06 NOTE — Progress Notes (Signed)
 Patient Care Team: Perri Ronal PARAS, MD as PCP - General (Internal Medicine)  Visit Date: 12/06/23  Subjective:    Patient ID: Alexandra Taylor , Female   DOB: 01/17/1951, 73 y.o.    MRN: [id number]   73 y.o. Female presents today for sciatic nerve pain. Patient has a past medical history of Hypertension, Hypothyroidism, Anxiety/depression.  She said that she is experiencing sciatic nerve pain. She has experienced it before but it wasn't as bad as it is now. It started two weeks ago. She recently went to [place] where she was lifting heavy luggage and walking up stairs. She said that the pain runs down the back of her  right leg to her foot and that it hurts to walk and drive. She has been taking tylenol  for pain relief. Yesterday she went to [facility] orthopedic specialist where they gave her an injection in the hip and prescribed her prednisone .  She has previously done physical therapy before and is open to doing it again.   Past Medical History:  Diagnosis Date   Hyperlipidemia    Hypertension    Osteopenia      Family History  Problem Relation Age of Onset   Hypertension Mother    Colon cancer Brother    Rectal cancer Neg Hx    Stomach cancer Neg Hx    Esophageal cancer Neg Hx     Social History   Social History Narrative   Married. Non-smoker. Enjoys reading.       Review of Systems  Musculoskeletal:  Positive for back pain.        Objective:   Vitals: BP 120/80   Pulse 70   Ht 5' 3 (1.6 m)   Wt 140 lb (63.5 kg)   SpO2 97%   BMI 24.80 kg/m    Physical Exam Neurological:     Deep Tendon Reflexes:     Reflex Scores:      Patellar reflexes are 2+ on the right side and 2+ on the left side.      Achilles reflexes are 1+ on the right side and 1+ on the left side.    Straight leg rasing is positive on the right. Muscle strength is normal in LEs. DTRs in knees are normal.Trunk ROM is fair  Results:    Labs:       Component Value Date/Time    NA 127 (L) 10/05/2023 1454   K 4.3 10/05/2023 1454   CL 93 (L) 10/05/2023 1454   CO2 27 10/05/2023 1454   GLUCOSE 97 10/05/2023 1454   BUN 11 10/05/2023 1454   CREATININE 0.79 10/05/2023 1454   CREATININE 0.91 03/15/2022 1437   CREATININE 0.96 10/11/2021 0918   CALCIUM 8.8 10/05/2023 1454   PROT 6.6 10/09/2023 0937   ALBUMIN 4.0 10/05/2023 1454   AST 38 (H) 10/09/2023 0937   AST 40 03/15/2022 1437   ALT 27 10/09/2023 0937   ALT 40 03/15/2022 1437   ALKPHOS 154 (H) 10/05/2023 1454   ALKPHOS 125 (H) 10/05/2023 1454   BILITOT 0.7 10/09/2023 0937   BILITOT 0.6 03/15/2022 1437   GFRNONAA >60 03/15/2022 1437   GFRNONAA 85 01/01/2020 1656   GFRAA 98 01/01/2020 1656     Lab Results  Component Value Date   WBC 4.8 10/05/2023   HGB 13.4 10/05/2023   HCT 39.2 10/05/2023   MCV 97.8 10/05/2023   PLT 207 10/05/2023   PLT 207.0 10/05/2023    Lab Results  Component  Value Date   CHOL 227 (H) 10/09/2023   HDL 125 10/09/2023   LDLCALC 92 10/09/2023   TRIG 35 10/09/2023   CHOLHDL 1.8 10/09/2023      Lab Results  Component Value Date   TSH 0.75 10/09/2023         Assessment & Plan:   Right sided sciatica: She said that she is experiencing sciatic nerve pain. She has experienced it before but it wasn't as bad as it is now. It started two weeks ago. She recently went to [place] where she was lifting heavy luggage and walking up stairs. She said that the pain runs down the back of her right leg to her foot and that it hurts to walk and drive. She has been taking Tylenol  for pain relief. Yesterday, she went to [facility]  where they gave her an injection in the hip and prescribed her prednisone . She has previously done physical therapy before and is open to doing it again.    Norco to take sparingly for pain 5-325 mg every 6 hours as needed for pain prescribed. (#15 tabs-) 5 day supply.    I,Makayla C Reid,acting as a scribe for Ronal JINNY Hailstone, MD.,have documented  all relevant documentation on the behalf of Ronal JINNY Hailstone, MD,as directed by  Ronal JINNY Hailstone, MD while in the presence of Ronal JINNY Hailstone, MD.

## 2023-12-09 ENCOUNTER — Encounter: Payer: Self-pay | Admitting: Internal Medicine

## 2023-12-10 ENCOUNTER — Telehealth

## 2023-12-10 ENCOUNTER — Telehealth: Payer: Self-pay | Admitting: Internal Medicine

## 2023-12-10 ENCOUNTER — Ambulatory Visit (INDEPENDENT_AMBULATORY_CARE_PROVIDER_SITE_OTHER): Admitting: Internal Medicine

## 2023-12-10 ENCOUNTER — Ambulatory Visit: Admitting: Internal Medicine

## 2023-12-10 DIAGNOSIS — M5416 Radiculopathy, lumbar region: Secondary | ICD-10-CM | POA: Diagnosis not present

## 2023-12-10 NOTE — Progress Notes (Deleted)
   Subjective:    Patient ID: Alexandra Taylor, female    DOB: 1951-01-23, 73 y.o.   MRN: [id number]  HPI  I connected  with Alexandra Taylor via telephone today regarding right sided sciatica.Says she has not heard from [facility] about PT appointment. She did not contact PT office used by [facility]. 5 day supply of Norco 5/325 was prescribed on November 9th.Patient had long wait in ED. Had colonoscopy June 2025    Review of Systems denies hot or swollen joints.Suspected sleep apnea- saw Dr. Quita Salt June 2025.Had home sleep test. Test suggested moderate sleep apnea.     Objective:   Physical Exam   VS reviewed. M/S is 5/5 in all groups tested.M/s was %/5 is all roups testsed.      Assessment & Plan:

## 2023-12-10 NOTE — Telephone Encounter (Signed)
 Patient called here  today requesting more pain medication as she is  still having severe lumbar radiculopathy symptoms. We have not received any records from [facility] regarding her evaluation.She was supposed to go there and request that they be sent to us .   She has not contacted their PT staff either. She has taken  the small quantity of narcotic pain medication (Norco 5/325 (#15 tabs) that I  prescribed for her last week.  Today, I am referring her to ED at [facility] for urgent evaluation. We have not been able to get anyone to answer the phone at [facility] in order to get records faxed to us .   MJB, MD

## 2023-12-11 ENCOUNTER — Emergency Department (HOSPITAL_BASED_OUTPATIENT_CLINIC_OR_DEPARTMENT_OTHER): Admitting: Radiology

## 2023-12-11 ENCOUNTER — Encounter: Payer: Self-pay | Admitting: Internal Medicine

## 2023-12-11 ENCOUNTER — Ambulatory Visit: Admitting: Internal Medicine

## 2023-12-11 ENCOUNTER — Emergency Department (HOSPITAL_BASED_OUTPATIENT_CLINIC_OR_DEPARTMENT_OTHER)
Admission: EM | Admit: 2023-12-11 | Discharge: 2023-12-11 | Disposition: A | Discharge status: Home / Self Care | Attending: Emergency Medicine | Admitting: Emergency Medicine

## 2023-12-11 ENCOUNTER — Other Ambulatory Visit: Payer: Self-pay

## 2023-12-11 ENCOUNTER — Telehealth

## 2023-12-11 DIAGNOSIS — M5126 Other intervertebral disc displacement, lumbar region: Secondary | ICD-10-CM | POA: Diagnosis not present

## 2023-12-11 DIAGNOSIS — M5431 Sciatica, right side: Secondary | ICD-10-CM

## 2023-12-11 DIAGNOSIS — M419 Scoliosis, unspecified: Secondary | ICD-10-CM | POA: Diagnosis not present

## 2023-12-11 DIAGNOSIS — G8929 Other chronic pain: Secondary | ICD-10-CM | POA: Diagnosis not present

## 2023-12-11 DIAGNOSIS — M48061 Spinal stenosis, lumbar region without neurogenic claudication: Secondary | ICD-10-CM | POA: Diagnosis not present

## 2023-12-11 DIAGNOSIS — I10 Essential (primary) hypertension: Secondary | ICD-10-CM | POA: Diagnosis not present

## 2023-12-11 DIAGNOSIS — M51369 Other intervertebral disc degeneration, lumbar region without mention of lumbar back pain or lower extremity pain: Secondary | ICD-10-CM | POA: Diagnosis not present

## 2023-12-11 DIAGNOSIS — M47816 Spondylosis without myelopathy or radiculopathy, lumbar region: Secondary | ICD-10-CM | POA: Diagnosis not present

## 2023-12-11 DIAGNOSIS — M545 Low back pain, unspecified: Secondary | ICD-10-CM | POA: Diagnosis present

## 2023-12-11 DIAGNOSIS — M5441 Lumbago with sciatica, right side: Secondary | ICD-10-CM | POA: Diagnosis not present

## 2023-12-11 MED ORDER — LIDOCAINE 5 % EX PTCH
3.0000 | MEDICATED_PATCH | CUTANEOUS | Status: DC
  Administered 2023-12-11: 3 via TRANSDERMAL
  Filled 2023-12-11: qty 3

## 2023-12-11 MED ORDER — KETOROLAC TROMETHAMINE 60 MG/2ML IM SOLN
30.0000 mg | Freq: Once | INTRAMUSCULAR | Status: AC
  Administered 2023-12-11: 30 mg via INTRAMUSCULAR
  Filled 2023-12-11: qty 2

## 2023-12-11 MED ORDER — PREDNISONE 20 MG PO TABS: ORAL_TABLET | ORAL | 0 refills | Status: DC

## 2023-12-11 MED ORDER — DEXAMETHASONE SOD PHOSPHATE PF 10 MG/ML IJ SOLN
10.0000 mg | Freq: Once | INTRAMUSCULAR | Status: AC
  Administered 2023-12-11: 10 mg via INTRAMUSCULAR

## 2023-12-11 MED ORDER — LIDOCAINE 5 % EX PTCH: 1.0000 | MEDICATED_PATCH | CUTANEOUS | 0 refills | Status: AC

## 2023-12-11 MED ORDER — LIDOCAINE 5 % EX PTCH: 1.0000 | MEDICATED_PATCH | CUTANEOUS | 0 refills | Status: DC

## 2023-12-11 MED ORDER — DICLOFENAC SODIUM ER 100 MG PO TB24: 100.0000 mg | ORAL_TABLET | Freq: Every day | ORAL | 0 refills | Status: AC

## 2023-12-11 NOTE — ED Provider Notes (Signed)
 [place] [facility] AT [facility] Provider Note   CSN: [id number] Arrival date & time: 12/11/23  9952     Patient presents with: Leg Pain   Alexandra Taylor is a 73 y.o. female.   The history is provided by the patient.  Leg Pain Location:  Buttock Time since incident: weeks, is supposed to be going to PT and having injections with orthopedics worsened in 1 day. Injury: no   Buttock location:  R buttock Pain details:    Quality:  Shooting   Radiates to:  R leg   Severity:  Severe   Timing:  Constant   Progression:  Unchanged Foreign body present:  No foreign bodies Prior injury to area:  No Relieved by:  Nothing Worsened by:  Nothing Ineffective treatments:  None tried Associated symptoms: no decreased ROM, no itching, no muscle weakness, no numbness, no stiffness, no swelling and no tingling   Risk factors: no concern for non-accidental trauma   Patient with sciatica who has seen orthopedics without relief but has not yet set up PT presents with ongoing R sided sciatica.  No low of control of bowels or bladder.  No gait dysfunction.  No weakness, no numbness.  No fevers.  Is completing steroids currently without relief.     Past Medical History:  Diagnosis Date   Hyperlipidemia    Hypertension    Osteopenia      Prior to Admission medications   Medication Sig Start Date End Date Taking? Authorizing Provider  Diclofenac Sodium CR 100 MG 24 hr tablet Take 1 tablet (100 mg total) by mouth daily. 12/11/23  Yes Felix Pratt, MD  lidocaine  (LIDODERM ) 5 % Place 1 patch onto the skin daily. Remove & Discard patch within 12 hours or as directed by MD 12/11/23  Yes Oceane Fosse, MD  lidocaine  (LIDODERM ) 5 % Place 1 patch onto the skin daily. Remove & Discard patch within 12 hours or as directed by MD 12/11/23  Yes Blonnie Maske, MD  predniSONE  (DELTASONE ) 20 MG tablet 3 tabs po day one, then 2 po daily x 4 days 12/11/23  Yes Riddick Nuon, MD  b  complex vitamins tablet Take 1 tablet by mouth daily.    [provider]  benzonatate  (TESSALON ) 100 MG capsule Take 1 capsule (100 mg total) by mouth 3 (three) times daily as needed. 10/25/23   Perri Ronal PARAS, MD  BIOTIN 5000 PO Take by mouth.    [provider]  buPROPion  (WELLBUTRIN  XL) 150 MG 24 hr tablet TAKE 1 TABLET BY MOUTH EVERY DAY 10/11/23   Perri Ronal PARAS, MD  FLUoxetine  (PROZAC ) 20 MG capsule TAKE 2 CAPSULES BY MOUTH EVERY DAY 11/07/23   Perri Ronal PARAS, MD  fluticasone -salmeterol (ADVAIR HFA) 230-21 MCG/ACT inhaler INHALE 2 PUFFS INTO THE LUNGS TWICE A DAY 10/30/23   Perri Ronal PARAS, MD  GLUCOSAMINE SULFATE PO Take by mouth daily.    [provider]  HYDROcodone -acetaminophen  (NORCO/VICODIN) 5-325 MG tablet Take 1 tablet by mouth every 6 (six) hours as needed for moderate pain (pain score 4-6). 12/06/23   Perri Ronal PARAS, MD  levothyroxine  (SYNTHROID ) 50 MCG tablet TAKE 1 TABLET BY MOUTH EVERY DAY BEFORE BREAKFAST 08/20/23   Perri Ronal PARAS, MD  methylPREDNISolone  (MEDROL ) 4 MG tablet Take 4 mg by mouth as directed. 12/05/23   [provider]  Milk Thistle 1000 MG CAPS Take by mouth. Pt states mg are 1300 mg    [provider]  pantoprazole  (  PROTONIX ) 40 MG tablet Take 1 tablet (40 mg total) by mouth daily. 10/25/23   Perri Ronal PARAS, MD  ramipril  (ALTACE ) 10 MG capsule TAKE 1 CAPSULE BY MOUTH EVERY DAY 11/15/23   Perri Ronal PARAS, MD    Allergies: Bee venom    Review of Systems  Cardiovascular:  Negative for chest pain.  Gastrointestinal:  Negative for abdominal pain.  Genitourinary:  Negative for difficulty urinating.  Musculoskeletal:  Negative for gait problem and stiffness.  Skin:  Negative for itching.  Neurological:  Negative for weakness and numbness.  All other systems reviewed and are negative.   Updated Vital Signs BP 139/81 (BP Location: Right Arm)   Pulse 63   Temp 98 F (36.7 C) (Temporal)   Resp 17   Ht 5' 3 (1.6 m)   Wt 63.5  kg   SpO2 96%   BMI 24.80 kg/m   Physical Exam Vitals and nursing note reviewed.  Constitutional:      General: She is not in acute distress.    Appearance: She is well-developed.  HENT:     Head: Normocephalic and atraumatic.     Nose: Nose normal.  Eyes:     Pupils: Pupils are equal, round, and reactive to light.  Cardiovascular:     Rate and Rhythm: Normal rate and regular rhythm.     Pulses: Normal pulses.     Heart sounds: Normal heart sounds.  Pulmonary:     Effort: Pulmonary effort is normal. No respiratory distress.     Breath sounds: Normal breath sounds.  Abdominal:     General: Bowel sounds are normal. There is no distension.     Palpations: Abdomen is soft.     Tenderness: There is no abdominal tenderness. There is no guarding or rebound.  Musculoskeletal:        General: Normal range of motion.     Cervical back: Normal and neck supple.     Thoracic back: Normal.     Lumbar back: Normal.  Skin:    General: Skin is warm and dry.     Capillary Refill: Capillary refill takes less than 2 seconds.     Findings: No erythema or rash.  Neurological:     General: No focal deficit present.     Mental Status: She is oriented to person, place, and time.     Deep Tendon Reflexes: Reflexes normal.  Psychiatric:        Mood and Affect: Mood normal.     (all labs ordered are listed, but only abnormal results are displayed) Labs Reviewed - No data to display  EKG: None  Radiology: DG Lumbar Spine Complete Result Date: 12/11/2023 EXAM: 4 VIEW(S) XRAY OF THE LUMBAR SPINE 12/11/2023 03:29:03 AM COMPARISON: MRI lumbar spine 08/03/2023. CLINICAL HISTORY: painetc. Table formatting from the original note was not included.; Pt BIB [facility] reporting R leg pain x1 day r/t sciatica. Took gabapentin, tylenol , and ibuprofen with no improvement. FINDINGS: LUMBAR SPINE: BONES: Osteopenia is present. No acute fracture. No aggressive appearing osseous lesion. All vertebrae are normal in  height. There are 5 lumbar type segments. Alignment demonstrates mild dextroscoliosis apex at L3. Grade 1 retrolisthesis is noted at L1-L2 and L2-L3, likely due to degenerative disc disease. Grade 1 anterolisthesis is noted at L3-L4 and L4-L5, likely due to moderate hypertrophic facet arthropathy at those 2 levels. No worsening or new alignment abnormality is seen. There is spurring of the SI joints. DISCS AND DEGENERATIVE CHANGES: There is mild lumbar  spondylosis. Partial disc space loss is noted at all lumbar levels with vacuum phenomenon at L1-L2, L4-L5, and L5-S1. Mild facet spurs are present at L2-L3 and L3-L4. Moderate facet hypertrophy is present at L3-L4 and L4-L5. The foramina are not well seen on the oblique views. SOFT TISSUES: No nephrolithiasis is evident. No other significant soft tissue findings. IMPRESSION: 1. No acute osseous abnormality of the lumbar spine. 2. Grade 1 retrolisthesis at L1-L2 and L2-L3 and grade 1 anterolisthesis at L3-L4 and L4-L5, all unchanged, all likely degenerative. Mild dextroscoliosis. 3. Multilevel disc space narrowing and spondylosis with vacuum phenomenon at L1-L2, L4-L5, and L5-S1. Electronically signed by: Francis Quam MD 12/11/2023 03:57 AM EDT RP Workstation: [id number]     Procedures   Medications Ordered in the ED  lidocaine  (LIDODERM ) 5 % 3 patch (3 patches Transdermal Patch Applied 12/11/23 0405)  dexamethasone  (DECADRON ) injection 10 mg (10 mg Intramuscular Given 12/11/23 0332)  ketorolac  (TORADOL ) injection 30 mg (30 mg Intramuscular Given 12/11/23 0332)                                    Medical Decision Making Patient with known sciatric who has seen orthopedics without relief but not yet in PT presents with ongoing symptoms   Amount and/or Complexity of Data Reviewed Independent Historian: EMS    Details: See above  External Data Reviewed: notes.    Details: Previous notes reviewed  Radiology: ordered and independent interpretation  performed.    Details: No fractures by me   Risk Prescription drug management. Risk Details: Well appearing.  Normal exam and vitals. No signs of cord issues.   Patient would benefit from PT as an outpatient.  Treated in the ED.  Will refer to spine for follow up and have advised follow up with PMD for PT referral.  Stable for discharge with close follow up.  Strict returns.       Final diagnoses:  Sciatica of right side  Degeneration of intervertebral disc of lumbar region, unspecified whether pain present     No signs of systemic illness or infection. The patient is nontoxic-appearing on exam and vital signs are within normal limits.  I have reviewed the triage vital signs and the nursing notes. Pertinent labs & imaging results that were available during my care of the patient were reviewed by me and considered in my medical decision making (see chart for details). After history, exam, and medical workup I feel the patient has been appropriately medically screened and is safe for discharge home. Pertinent diagnoses were discussed with the patient. Patient was given return precautions.       Roanna Reaves, MD 12/11/23 [phone]

## 2023-12-11 NOTE — ED Triage Notes (Addendum)
 Pt BIB [facility] reporting R leg pain x1 day r/t sciatica. Took gabapentin, tylenol , and ibuprofen with no improvement.

## 2023-12-12 ENCOUNTER — Ambulatory Visit

## 2023-12-12 ENCOUNTER — Other Ambulatory Visit (HOSPITAL_BASED_OUTPATIENT_CLINIC_OR_DEPARTMENT_OTHER)

## 2023-12-12 ENCOUNTER — Ambulatory Visit: Payer: Self-pay | Admitting: Internal Medicine

## 2023-12-12 DIAGNOSIS — Z13820 Encounter for screening for osteoporosis: Secondary | ICD-10-CM

## 2023-12-12 DIAGNOSIS — Z780 Asymptomatic menopausal state: Secondary | ICD-10-CM | POA: Diagnosis not present

## 2023-12-12 DIAGNOSIS — M8589 Other specified disorders of bone density and structure, multiple sites: Secondary | ICD-10-CM | POA: Diagnosis not present

## 2023-12-13 ENCOUNTER — Encounter: Payer: Self-pay | Admitting: Internal Medicine

## 2023-12-13 ENCOUNTER — Telehealth: Payer: Self-pay | Admitting: Internal Medicine

## 2023-12-13 ENCOUNTER — Ambulatory Visit: Admitting: Internal Medicine

## 2023-12-13 VITALS — BP 120/70 | HR 70 | Ht 63.0 in | Wt 140.0 lb

## 2023-12-13 DIAGNOSIS — M5416 Radiculopathy, lumbar region: Secondary | ICD-10-CM | POA: Diagnosis not present

## 2023-12-13 MED ORDER — HYDROCODONE-ACETAMINOPHEN 10-325 MG PO TABS: ORAL_TABLET | ORAL | 0 refills | Status: DC

## 2023-12-13 MED ORDER — METHYLPREDNISOLONE 4 MG PO TABS: ORAL_TABLET | ORAL | 0 refills | Status: DC

## 2023-12-13 NOTE — Progress Notes (Signed)
 Patient Care Team: Perri Ronal PARAS, MD as PCP - General (Internal Medicine)  Visit Date: 12/13/23  Subjective:    Patient ID: Alexandra Taylor , Female   DOB: August 31, 1950, 73 y.o.    MRN: [id number]   73 y.o. Female presents today for ED follow up of right sided sciatica.  Patient has a past medical history of Hypertension, Hypothyroidism, Anxiety/depression.  On 12/11/2023 she presented to [facility] ED with right radiculopathy. She said she was having severe shooting pain that was radiating to her right leg. Lumbar Xray showed no acute osseous abnormality of the lumbar spine.  Grade 1 retrolisthesis at L1-L2 and L2-L3 and grade 1 anterolisthesis at L3-L4 and L4-L5,  were all unchanged, all likely degenerative. Mild dextroscoliosis.  Multilevel disc space narrowing and spondylosis with vacuum phenomenon at L1-L2, L4-L5, and L5-S1. She was prescribed oral taper of prednisone  20 mg 3,3,2,2,1, and discharged.   Has been having left buttock pain since around June. No known injury to back.She has been evaluated at [facility] orthopedics. PT was advised but for sone reason patient has not pursued that with physical therapy office recommended by [facility] which is located close to [employer] office on same floor in same building.   She was seen here 12/06/2023 for sciatic nerve pain where she was prescribed Norco 5-325 every 6 hours as needed.(#15 tabs) She says she was given a steroid injection at [facility] on October 8. Today she said that she is still  in pain and that pain is worse  at night. She has been trying to do physical therapy exercises but it causes her pain. She has an appointment with [facility] on Monday. She walks and is physically very active. Stays in good physical condition.    Past Medical History:  Diagnosis Date   Hyperlipidemia    Hypertension    Osteopenia      Family History  Problem Relation Age of Onset   Hypertension Mother    Colon  cancer Brother    Rectal cancer Neg Hx    Stomach cancer Neg Hx    Esophageal cancer Neg Hx     Social History   Social History Narrative   Married. Non-smoker. Enjoys reading.       Review of Systems  Musculoskeletal:  Positive for back pain.        Objective:   Vitals: BP 120/70   Pulse 70   Ht 5' 3 (1.6 m)   Wt 140 lb (63.5 kg)   SpO2 96%   BMI 24.80 kg/m    Physical Exam Neurological:     Deep Tendon Reflexes: Reflexes are normal and symmetric.     Comments: Straight leg raises cause pain in right leg.     Muscle strength of RLE is normal.  DTR 1+ right ankle and 2+ in right knee.   Results:    Labs:       Component Value Date/Time   NA 127 (L) 10/05/2023 1454   K 4.3 10/05/2023 1454   CL 93 (L) 10/05/2023 1454   CO2 27 10/05/2023 1454   GLUCOSE 97 10/05/2023 1454   BUN 11 10/05/2023 1454   CREATININE 0.79 10/05/2023 1454   CREATININE 0.91 03/15/2022 1437   CREATININE 0.96 10/11/2021 0918   CALCIUM 8.8 10/05/2023 1454   PROT 6.6 10/09/2023 0937   ALBUMIN 4.0 10/05/2023 1454   AST 38 (H) 10/09/2023 0937   AST 40 03/15/2022 1437   ALT 27  10/09/2023 0937   ALT 40 03/15/2022 1437   ALKPHOS 154 (H) 10/05/2023 1454   ALKPHOS 125 (H) 10/05/2023 1454   BILITOT 0.7 10/09/2023 0937   BILITOT 0.6 03/15/2022 1437   GFRNONAA >60 03/15/2022 1437   GFRNONAA 85 01/01/2020 1656   GFRAA 98 01/01/2020 1656     Lab Results  Component Value Date   WBC 4.8 10/05/2023   HGB 13.4 10/05/2023   HCT 39.2 10/05/2023   MCV 97.8 10/05/2023   PLT 207 10/05/2023   PLT 207.0 10/05/2023    Lab Results  Component Value Date   CHOL 227 (H) 10/09/2023   HDL 125 10/09/2023   LDLCALC 92 10/09/2023   TRIG 35 10/09/2023   CHOLHDL 1.8 10/09/2023    No results found for: HGBA1C   Lab Results  Component Value Date   TSH 0.75 10/09/2023         Assessment & Plan:   Right lumbar radiculoathy Sciatic nerve pain: On 12/11/2023 She was seen at [facility] ED  with right leg pain. She said she was having severe shooting pain in right buttock that radiates to her right leg. Lumbar Xray showed No acute osseous abnormality of the lumbar spine. 2. Grade 1 retrolisthesis at L1-L2 and L2-L3 and grade 1 anterolisthesis at L3-L4 and L4-L5, all unchanged, all likely degenerative. Mild dextroscoliosis. 3. Multilevel disc space narrowing and spondylosis with vacuum phenomenon at L1-L2, L4-L5, and L5-S1. She was prescribed prednisone  20 mg to take in tapering course 3,3,2,2,1,1 course and discharged. This has helped symptoms some.   Her last visit was 12/06/2023 for sciatic nerve pain where she was prescribed Norco 5-325 every 6 hours as needed(#15 tabs). She was initially seen for this problem at [facility]  where she was prescribed oral prednisone  taper. Today, she said that she is still pain and that it hurts more at night. She has been trying to physical therapy exercises but it causes her pain. She has an appointment with [facility] on Monday.     Norco/Vicodin 10-325 mg take on half  tab every 6 hours as needed-- prescribed.  #15 tabs  Medrol  4 mg 6-5-4-3-2-1 take in tapering course prescribed. prescribed #21 tabs Keep appt with [facility] this coming Monday.    I,Makayla C Reid,acting as a scribe for Ronal JINNY Hailstone, MD.,have documented all relevant documentation on the behalf of Ronal JINNY Hailstone, MD,as directed by  Ronal JINNY Hailstone, MD while in the presence of Ronal JINNY Hailstone, MD.   I, Ronal JINNY Hailstone, MD, have reviewed all documentation for this visit. The documentation on 12/13/2023 for the exam, diagnosis, procedures, and orders are all accurate and complete.

## 2023-12-13 NOTE — Patient Instructions (Addendum)
 I am suggesting PT. She should contact PT office that [facility] referred her to. Given 5 day supply of Norco To take sparingly for pain.

## 2023-12-14 ENCOUNTER — Ambulatory Visit: Payer: Self-pay | Admitting: Gastroenterology

## 2023-12-14 ENCOUNTER — Other Ambulatory Visit (INDEPENDENT_AMBULATORY_CARE_PROVIDER_SITE_OTHER)

## 2023-12-14 ENCOUNTER — Encounter: Payer: Self-pay | Admitting: Gastroenterology

## 2023-12-14 DIAGNOSIS — R748 Abnormal levels of other serum enzymes: Secondary | ICD-10-CM

## 2023-12-14 DIAGNOSIS — R7989 Other specified abnormal findings of blood chemistry: Secondary | ICD-10-CM | POA: Diagnosis not present

## 2023-12-14 LAB — HEPATIC FUNCTION PANEL
ALT: 22 U/L (ref 0–35)
AST: 23 U/L (ref 0–37)
Albumin: 4.1 g/dL (ref 3.5–5.2)
Alkaline Phosphatase: 128 U/L — ABNORMAL HIGH (ref 39–117)
Bilirubin, Direct: 0.2 mg/dL (ref 0.0–0.3)
Total Bilirubin: 0.7 mg/dL (ref 0.2–1.2)
Total Protein: 6.5 g/dL (ref 6.0–8.3)

## 2023-12-14 NOTE — Progress Notes (Signed)
   Subjective:    Patient ID: Alexandra Taylor, female    DOB: [birth year], 73 y.o.   MRN: [id number]  HPI 73 year old Female with history of hypertension, hypothyroidism status post hemithyroidectomy for hyperthyroidism in 2004, anxiety and depression as well as history of elevated liver functions followed by gastroenterologist.  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  She used to consume alcohol but has cut back considerably since receiving that diagnosis.  She began to have issues with left hip pain in June.  Saw Dr. Josefina, orthopedist and was also having some issues with low back pain at that time.  In early October she messaged me about seeing Dr. Eldonna regarding her back pain.  I saw her here in the office on October 9.  She tells me she was taking Tylenol  for pain relief.  It seemed to start when she went on a trip to [place], was lifting heavy luggage and walking up stairs.  She had been having the pain at that time for 2 weeks.  She went to [facility] and she says they gave her an injection in her hip and prescribed prednisone .  During the visit on October 9, I gave her #15 tablets of Norco 5/325 to take sparingly every 6 hours as needed for pain.  She was advised to follow-up with her for [facility] since she had just received an injection in her hip and prednisone  on October 8.  Subsequently she called back on October 13 requesting additional pain medication.  She was complaining of severe lumbar radiculopathy.  We had requested that she contact the PT staff that [facility] recommended at her visit on October 9 but she for some reason she did not get in touch with them.      Review of Systems     Objective:   Physical Exam        Assessment & Plan:

## 2023-12-15 ENCOUNTER — Encounter: Payer: Self-pay | Admitting: Internal Medicine

## 2023-12-15 NOTE — Patient Instructions (Signed)
 Patient has appointment at [facility] on Monday, October 20.  She will take pain medication sparingly and finish tapering course of methylprednisolone .  She is comfortable with this plan.

## 2023-12-17 DIAGNOSIS — M4726 Other spondylosis with radiculopathy, lumbar region: Secondary | ICD-10-CM | POA: Diagnosis not present

## 2023-12-17 DIAGNOSIS — Z6825 Body mass index (BMI) 25.0-25.9, adult: Secondary | ICD-10-CM | POA: Diagnosis not present

## 2023-12-17 DIAGNOSIS — M4316 Spondylolisthesis, lumbar region: Secondary | ICD-10-CM | POA: Diagnosis not present

## 2023-12-18 LAB — ALKALINE PHOSPHATASE ISOENZYMES
Alkaline phosphatase (APISO): 130 U/L (ref 37–153)
Bone Isoenzymes: 27 % — ABNORMAL LOW (ref 28–66)
Intestinal Isoenzymes: 55 % — ABNORMAL HIGH (ref 1–24)
Liver Isoenzymes: 18 % — ABNORMAL LOW (ref 25–69)
Macrohepatic isoenzymes: 0 % (ref <=0)
Placental isoenzymes: 0 % (ref <=0)

## 2023-12-19 DIAGNOSIS — M9904 Segmental and somatic dysfunction of sacral region: Secondary | ICD-10-CM | POA: Diagnosis not present

## 2023-12-19 DIAGNOSIS — M51361 Other intervertebral disc degeneration, lumbar region with lower extremity pain only: Secondary | ICD-10-CM | POA: Diagnosis not present

## 2023-12-19 DIAGNOSIS — M9905 Segmental and somatic dysfunction of pelvic region: Secondary | ICD-10-CM | POA: Diagnosis not present

## 2023-12-19 DIAGNOSIS — M9903 Segmental and somatic dysfunction of lumbar region: Secondary | ICD-10-CM | POA: Diagnosis not present

## 2023-12-19 NOTE — Patient Instructions (Addendum)
 You have been prescribed today Medrol  4 mg tab to take in tapering course 6-5-4-3-2-1 and Norco 10/325 to take one half tab every 6 hours as needed. Keep appt with [facility].

## 2023-12-20 DIAGNOSIS — M9903 Segmental and somatic dysfunction of lumbar region: Secondary | ICD-10-CM | POA: Diagnosis not present

## 2023-12-20 DIAGNOSIS — M9904 Segmental and somatic dysfunction of sacral region: Secondary | ICD-10-CM | POA: Diagnosis not present

## 2023-12-20 DIAGNOSIS — M9905 Segmental and somatic dysfunction of pelvic region: Secondary | ICD-10-CM | POA: Diagnosis not present

## 2023-12-20 DIAGNOSIS — M51361 Other intervertebral disc degeneration, lumbar region with lower extremity pain only: Secondary | ICD-10-CM | POA: Diagnosis not present

## 2023-12-21 LAB — CELIAC PANEL 10
Antigliadin Abs, IgA: 3 U (ref 0–19)
Gliadin IgG: 2 U (ref 0–19)
IgA/Immunoglobulin A, Serum: 219 mg/dL (ref 64–422)
Tissue Transglut Ab: <2 U/mL (ref 0–5)
Transglutaminase IgA: <2 U/mL (ref 0–3)

## 2023-12-24 DIAGNOSIS — M4726 Other spondylosis with radiculopathy, lumbar region: Secondary | ICD-10-CM | POA: Diagnosis not present

## 2023-12-24 DIAGNOSIS — M9904 Segmental and somatic dysfunction of sacral region: Secondary | ICD-10-CM | POA: Diagnosis not present

## 2023-12-24 DIAGNOSIS — M9903 Segmental and somatic dysfunction of lumbar region: Secondary | ICD-10-CM | POA: Diagnosis not present

## 2023-12-24 DIAGNOSIS — M9905 Segmental and somatic dysfunction of pelvic region: Secondary | ICD-10-CM | POA: Diagnosis not present

## 2023-12-24 DIAGNOSIS — M51361 Other intervertebral disc degeneration, lumbar region with lower extremity pain only: Secondary | ICD-10-CM | POA: Diagnosis not present

## 2023-12-25 DIAGNOSIS — M4726 Other spondylosis with radiculopathy, lumbar region: Secondary | ICD-10-CM | POA: Diagnosis not present

## 2023-12-25 DIAGNOSIS — M79604 Pain in right leg: Secondary | ICD-10-CM | POA: Diagnosis not present

## 2023-12-26 ENCOUNTER — Encounter: Payer: Self-pay | Admitting: Internal Medicine

## 2023-12-27 ENCOUNTER — Telehealth: Payer: Self-pay | Admitting: Internal Medicine

## 2023-12-27 DIAGNOSIS — M79604 Pain in right leg: Secondary | ICD-10-CM | POA: Diagnosis not present

## 2023-12-27 DIAGNOSIS — M4726 Other spondylosis with radiculopathy, lumbar region: Secondary | ICD-10-CM | POA: Diagnosis not present

## 2023-12-27 NOTE — Telephone Encounter (Signed)
 Patient calling about worsening back pain after epidural steroid injection at [facility].  Have advised pt to call them back and relay message to physiatrist that did the injection that she is worse. MJB, MD

## 2023-12-28 ENCOUNTER — Ambulatory Visit (INDEPENDENT_AMBULATORY_CARE_PROVIDER_SITE_OTHER): Admitting: Internal Medicine

## 2023-12-28 ENCOUNTER — Encounter: Payer: Self-pay | Admitting: Internal Medicine

## 2023-12-28 VITALS — BP 120/70 | HR 72 | Ht 63.0 in | Wt 137.0 lb

## 2023-12-28 DIAGNOSIS — M5416 Radiculopathy, lumbar region: Secondary | ICD-10-CM | POA: Diagnosis not present

## 2023-12-28 DIAGNOSIS — M48062 Spinal stenosis, lumbar region with neurogenic claudication: Secondary | ICD-10-CM | POA: Diagnosis not present

## 2023-12-28 MED ORDER — HYDROCODONE-ACETAMINOPHEN 10-325 MG PO TABS: ORAL_TABLET | ORAL | 0 refills | Status: DC

## 2023-12-28 NOTE — Progress Notes (Addendum)
 Patient Care Team: Perri Ronal PARAS, MD as PCP - General (Internal Medicine)  Visit Date: 12/28/23  Subjective:    Patient ID: Alexandra Taylor , Female   DOB: [birth year], 73 y.o.    MRN: [id number]   73 y.o. Female presents today for persistent back pain. Patient has a past medical history of Hypertension, Hypothyroidism, Anxiety/depression.  Recent hx with back/buttock pain is complicated. Review of records in [facility] show patient was seen by Dr. Josefina for left hip pain June 25 and seen again July 11th. She messaged me in [facility] on October 7th saying she had been going to [facility] for back pain. We tried but were unable to get records from [facility]. We left several messages. We saw her on October 9th.     Patient was seen October 9 with sciatica. She told us  she had recently been to [place] where she was lifting heavy luggage and walking up stairs.  She complained of pain running down the back of her right leg to her foot.  She complained of pain with walking and driving.  Tylenol  was not helping the pain.  Patient said she had received an injection in her hip at [facility] on October 8 and had been prescribed prednisone .  At that visit on October 8, we prescribed Norco 5/325 every 6 hours as needed for pain #15 tablets (5-day supply).  I spoke with her on October 13.  She was subsequently referred to [facility] on October 14 for evaluation.  She had LS-spine films done there showing grade 1 retrolisthesis at L2-L1, L2-L3 and mild dextroscoliosis at L3.  Had anterolisthesis at L3-L4 and L4-L5 likely due to moderate hypertrophic facet arthropathy at those levels.  Had mild spurring of SI joints and mild lumbar spondylosis.  Had mild dextroscoliosis.  She was in moderate distress and straight leg raising was positive on the right.  Deep tendon reflexes were 2+ and symmetrical in the knees but absent in the ankles.  Range of motion in the trunk was poor due to spasm.  She was  prescribed diclofenac and lidocaine  patch.    She was last seen here on 12/13/2023 for right lumbar radiculopathy where she was prescribed Norco 10-325 mg and Medrol  4 mg tapering  6 day course.  She recently received an epidural steroid injection from [facility] but says that her back pain has gotten worse. She has had right sciatica symptoms since early summer with no relief. Was to have gone to PT through [facility] who saw her initially but she never went to PT.  Most recently she has been seen  at [facility]. Has had some PT there but says PT has worsened the pain in right buttock area.  Past Medical History:  Diagnosis Date   Hyperlipidemia    Hypertension    Osteopenia      Family History  Problem Relation Age of Onset   Hypertension Mother    Colon cancer Brother    Rectal cancer Neg Hx    Stomach cancer Neg Hx    Esophageal cancer Neg Hx     Social History   Social History Narrative   Married. Non-smoker. Enjoys reading.       Review of Systems  Musculoskeletal:  Positive for back pain.        Objective:   Vitals: BP 120/70   Pulse 72   Ht 5' 3 (1.6 m)   Wt 137 lb (62.1 kg)   SpO2  98%   BMI 24.27 kg/m    Physical Exam Neurological:     Deep Tendon Reflexes:     Reflex Scores:      Patellar reflexes are 2+ on the right side and 2+ on the left side.    Has considerable spasm right leg with straight leg rasing and has pain. Muscle strength RLE is normal. Sensation intact.  Results:    Labs:       Component Value Date/Time   NA 127 (L) 10/05/2023 1454   K 4.3 10/05/2023 1454   CL 93 (L) 10/05/2023 1454   CO2 27 10/05/2023 1454   GLUCOSE 97 10/05/2023 1454   BUN 11 10/05/2023 1454   CREATININE 0.79 10/05/2023 1454   CREATININE 0.91 03/15/2022 1437   CREATININE 0.96 10/11/2021 0918   CALCIUM 8.8 10/05/2023 1454   PROT 6.5 12/14/2023 1204   ALBUMIN 4.1 12/14/2023 1204   AST 23 12/14/2023 1204   AST  40 03/15/2022 1437   ALT 22 12/14/2023 1204   ALT 40 03/15/2022 1437   ALKPHOS 128 (H) 12/14/2023 1204   BILITOT 0.7 12/14/2023 1204   BILITOT 0.6 03/15/2022 1437   GFRNONAA >60 03/15/2022 1437   GFRNONAA 85 01/01/2020 1656   GFRAA 98 01/01/2020 1656     Lab Results  Component Value Date   WBC 4.8 10/05/2023   HGB 13.4 10/05/2023   HCT 39.2 10/05/2023   MCV 97.8 10/05/2023   PLT 207 10/05/2023   PLT 207.0 10/05/2023    Lab Results  Component Value Date   CHOL 227 (H) 10/09/2023   HDL 125 10/09/2023   LDLCALC 92 10/09/2023   TRIG 35 10/09/2023   CHOLHDL 1.8 10/09/2023     Lab Results  Component Value Date   TSH 0.75 10/09/2023        Assessment & Plan:   Meds ordered this encounter  Medications   HYDROcodone -acetaminophen  (NORCO) 10-325 MG tablet    Sig: One half to one tab with food every 8 hours as needed for radiculopathy    Dispense:  15 tablet    Refill:  0    Right Lumbar Radiculopathy: On 12/11/2023 she was seen at [facility] ED. She said she was having severe shooting pain in right buttock that radiates to her right leg. they took a lumbar X-ray that showed showed No acute osseous abnormality of the lumbar spine. Grade 1 retrolisthesis at L1-L2 and L2-L3 and grade 1 anterolisthesis at L3-L4 and L4-L5, all unchanged, all likely degenerative. Mild dextroscoliosis. Multilevel disc space narrowing and spondylosis with vacuum phenomenon at L1-L2, L4-L5, and L5-S1.There she was prescribed prednisone  and discharged.  She was last seen here on 12/13/2023 for right lumbar radiculopathy where she was prescribed Norco/vicodin 10-325 mg and Medrol  4 mg. She recently received an epidural steroid injection from [facility] but said that her back pain has gotten worse. She has had this pain since June and has gone through three courses of prednisone . She has been doing physical therapy recently but says that it is very painful.    Norco 10-325 mg every 8 hours as  needed # 15 tablets prescribed today.  Needs to follow-up with Dr. Darlis next week at [facility].  I,Makayla C Reid,acting as a scribe for Ronal JINNY Hailstone, MD.,have documented all relevant documentation on the behalf of Ronal JINNY Hailstone, MD,as directed by  Ronal JINNY Hailstone, MD while in the presence of Ronal JINNY Hailstone, MD.  Addendum: On October 31, she brought us   a copy of the MRI of lumbar spine without contrast performed on 08/03/2023 and read by Dr. Rosabel showing a broad-based disc osteophyte slightly effacing the ventral thecal sac at L1-L2.  No significant spinal or foraminal stenosis appreciated.  At L2-L3 there is a mild broad-based disc osteophyte and facet arthrosis.  Mild caudal foraminal narrowing is present bilaterally.  At L3-L4 there is a broad-based disc osteophyte and moderate facet arthrosis.  There is caudal foraminal narrowing without definite impingement of the exiting L3 nerves.  Correlation for mild L3 radicular symptoms.  At L4-L5 there is grade 1 anterior spondylolisthesis secondary to moderate facet arthrosis, left greater than right.  There is a broad-based bulge effacing the ventral thecal sac crowding the disc sending nerve roots in the lateral recess.  There is mild spinal stenosis with the AP diameter of the canal measuring 9 mm.  At L5-S1 a broad-based bulge with small central component.  No significant spinal stenosis.  Moderate foraminal stenosis on the right.  Correlation for right L5 radiculopathy.    IRonal JINNY Hailstone, MD, have reviewed all documentation for this visit. The documentation on 12/28/2023 for the exam, diagnosis, procedures, and orders are all accurate and complete.

## 2023-12-28 NOTE — Patient Instructions (Signed)
 Patient was given a 5-day supply of Norco 10/325 to take 1 tablet every 8 hours as needed for severe lumbar radiculopathy.  Needs to follow-up with Dr. Darlis next week at [facility].

## 2024-01-01 DIAGNOSIS — M4726 Other spondylosis with radiculopathy, lumbar region: Secondary | ICD-10-CM | POA: Diagnosis not present

## 2024-01-01 DIAGNOSIS — M79604 Pain in right leg: Secondary | ICD-10-CM | POA: Diagnosis not present

## 2024-01-03 ENCOUNTER — Other Ambulatory Visit: Payer: Self-pay

## 2024-01-03 DIAGNOSIS — R7989 Other specified abnormal findings of blood chemistry: Secondary | ICD-10-CM

## 2024-01-03 DIAGNOSIS — M4726 Other spondylosis with radiculopathy, lumbar region: Secondary | ICD-10-CM | POA: Diagnosis not present

## 2024-01-03 DIAGNOSIS — M79604 Pain in right leg: Secondary | ICD-10-CM | POA: Diagnosis not present

## 2024-01-04 ENCOUNTER — Encounter: Payer: Self-pay | Admitting: Physician Assistant

## 2024-01-04 ENCOUNTER — Ambulatory Visit: Admitting: Physician Assistant

## 2024-01-04 VITALS — BP 122/64 | HR 73 | Ht 63.0 in | Wt 132.0 lb

## 2024-01-04 DIAGNOSIS — R748 Abnormal levels of other serum enzymes: Secondary | ICD-10-CM

## 2024-01-04 DIAGNOSIS — R7989 Other specified abnormal findings of blood chemistry: Secondary | ICD-10-CM | POA: Diagnosis not present

## 2024-01-04 DIAGNOSIS — Z860101 Personal history of adenomatous and serrated colon polyps: Secondary | ICD-10-CM

## 2024-01-04 NOTE — Patient Instructions (Addendum)
 Please come back to the basement of our building and get labs that are in epic.  You do not need an appointment.  Our office is located at [facility] office at [address] [place], [place] [address].  You can come anytime to the basement of our building  Monday through friday between the hours of 7:30 am and 4:00 pm to have labs drawn.   MAXIMUM AMOUNT OF TYLENOL  IN A DAY  The max you can take of tylenol  a day is 3000mg  daily This is a max of 6 pills a day of the regular tyelnol (500mg ) Or this is a max of 4 a day of the tylenol  arthritis (650mg )  Please makes sure you look at all other medications you are taking to assure they do NOT contain any additional tylenol , examples are cold medications, night time medications for sleep, or pain medications.

## 2024-01-04 NOTE — Progress Notes (Signed)
 01/04/2024 Alexandra Taylor [id number] 06-14-1950  Referring provider: Perri Ronal PARAS, MD Primary GI doctor: Dr. Charlanne (Dr. Eda)  ASSESSMENT AND PLAN:   Abnormal liver function    Latest Ref Rng & Units 12/14/2023   12:04 PM 10/09/2023    9:37 AM 10/05/2023    2:54 PM  Hepatic Function  Total Protein 6.0 - 8.3 g/dL 6.5  6.6  6.4   Albumin 3.5 - 5.2 g/dL 4.1   4.0   AST 0 - 37 U/L 23  38  35    31   ALT 0 - 35 U/L 22  27  28    26    Alk Phosphatase 39 - 117 U/L 128   154    125   Total Bilirubin 0.2 - 1.2 mg/dL 0.7  0.7  0.5   Bilirubin, Direct 0.0 - 0.3 mg/dL 0.2  0.2     Platelets 207; 207.0  INR 07/16/2023 1.0  Negative HFE gene Negative ASMA AFP, celiac and acute hepatitis panel 06/14/2022 liver biopsy Histiocytic portal and lobular inflammation suggestive of resolved liver injury. Hepatocellular siderosis. No evidence of fibrosis. Iron index <1  10/04/2022 MRI liver with and without contrast  Unchanged minimal common bile duct dilatation, measuring up to 0.9 cm. No calculus or other obstruction identified to the ampulla 05/01/2023 ABUS no new focal lesions heterogeneous and overall mildly increased echogenicity nonspecific Alkaline phosphatase isoenzymes show elevated intestinal isoenzymes of 55 (24 upper limit) - repeat LFT's in 1 month, sent patient reminder  - discussed max amount of tylenol , given information -Consider repeat CT AB scan  Alcohol use Encourage patient to continue to cut back on alcohol as this could significantly affect her liver. Has cut back significantly, congratulated patient  Family history of colon cancer in brother 08/2023 colonoscopy - One 6 mm polyp in the distal ascending colon, removed with a cold snare. Resected and retrieved. - Mild sigmoid diverticulosis. - Internal hemorrhoids. Recall 5 years  Patient Care Team: Alexandra Ronal PARAS, MD as PCP - General (Internal Medicine)  HISTORY OF PRESENT ILLNESS: 73 y.o. female with a past  medical history of hypertension, hypothyroidism, attention deficit disorder, anxiety and others listed below presents for evaluation of liver biopsy.   Previous patient of Dr. Eda with history of iron deposition in liver, spleen, no splenic or bone marrow reticuloendothelial iron deposition.  Ferritin less than 500, iron levels normal, HFE testing negative.  07/19/2023 office visit with Dr. Charlanne for liver lab follow-up. Liver biopsy 05/2022 no fibrosis or cirrhosis  Discussed the use of AI scribe software for clinical note transcription with the patient, who gave verbal consent to proceed.  History of Present Illness   Alexandra Taylor is a 73 year old female who presents for follow-up of elevated alkaline phosphatase levels and sciatica pain management.  She has elevated alkaline phosphatase levels, with recent measurements slightly above normal at 154, 125, and 128 U/L, where normal is less than 117 U/L. Her bilirubin, AST, and ALT levels remain within normal limits. Previous imaging, including an MRI of the liver in March 2024 and a CT of the abdomen and pelvis in December 2023, revealed no significant abnormalities.  She experiences significant sciatica pain, describing it as 'terrible.' She uses prednisone , Tylenol , and ibuprofen for pain management but finds them ineffective. She is concerned about the potential impact of these medications on her liver, especially given her history of elevated liver enzymes. No abdominal pain, itching, yellowing of  the eyes or skin, nausea, vomiting, or abdominal swelling. Her sciatica pain radiates down her leg.  In her social history, she consumes less than two alcoholic drinks per week and has significantly reduced her alcohol intake. She had a normal colonoscopy in July and is scheduled for another in five years.       She  reports that she quit smoking about 39 years ago. Her smoking use included cigarettes. She started smoking about 54  years ago. She has a 15 pack-year smoking history. She has never used smokeless tobacco. She reports current alcohol use. She reports that she does not use drugs.  Evaluation with an ultrasound 10/24/2021 showed an echogenic liver with nodular contours.  The sonographic appearance suggest chronic liver disease and possible cirrhosis.  No mass seen.  The radiologist recommended an MRI.  No further abdominal imaging has been performed.   Labs 10/11/2021: Sodium 135, creatinine 0.96, total bilirubin 0.9, AST 46, ALT 34, alk phos 119, hemoglobin 13.9, platelets 17 Labs 01/27/22 ferritin 353, iron 168 Labs 03/15/22 ferritin 290, iron 96 Labs 03/15/22 HFE DNA mutation analysis negative   MRI 02/03/22: IMPRESSION: 1. There is heterogeneous loss of signal within the liver on the inphase sequences. Imaging findings are compatible with iron deposition disease within the liver. No specific findings identified to suggest cirrhosis. 2. Numerous nodular areas of relative increased T2 signal and relative increased T1 signal on the early arterial phase images of the liver. These are favored to represent areas of iron deposition sparing. Recommend follow-up imaging in 3 months with repeat contrast enhanced MRI of the liver using Eovist  contrast material to reassess these areas. 3. Mild fusiform dilatation of the CBD measures up to 8 mm. No signs of choledocholithiasis. 4. Signs of iron deposition within the spleen are also identified.   Ultrasound with Elastography Median kPa: 2.9 IQR: 0.4 IQR/Median kPa ratio: 0.14 Data quality:  Good Diagnostic category:  < or = 5 kPa: high probability of being normal   CT abd/pelvis with contrast 02/23/22: No acute abnormality in the abdomen or pelvis. Cholelithiasis. Colonic diverticulosis without diverticulitis.   MRI 05/06/1932 with and without Eovist  showed changes in the liver consistent with iron deposition.  There is no evidence for splenic or marrow reticuloendothelial  iron deposition on this exam.   Endoscopic history: - Colonoscopy with Dr. Zulema in 2009 may have revealed a polyp (results not available to me).  - Colonoscopy 12/11/2017 with Dr. Saintclair showed sigmoid diverticulosis and was otherwise normal to the terminal ileum.  Surveillance recommended in 5 years given the family history.  RELEVANT LABS AND IMAGING: CBC    Component Value Date/Time   WBC 4.8 10/05/2023 1454   RBC 4.00 10/05/2023 1454   HGB 13.4 10/05/2023 1454   HGB 13.8 03/15/2022 1437   HCT 39.2 10/05/2023 1454   PLT 207 10/05/2023 1454   PLT 207.0 10/05/2023 1454   MCV 97.8 10/05/2023 1454   MCH 33.2 06/14/2022 1238   MCHC 34.2 10/05/2023 1454   RDW 14.0 10/05/2023 1454   LYMPHSABS 1.0 10/05/2023 1454   MONOABS 0.5 10/05/2023 1454   EOSABS 0.2 10/05/2023 1454   BASOSABS 0.0 10/05/2023 1454   Recent Labs    07/16/23 0944 10/05/23 1454  HGB 13.7 13.4    CMP     Component Value Date/Time   NA 127 (L) 10/05/2023 1454   K 4.3 10/05/2023 1454   CL 93 (L) 10/05/2023 1454   CO2 27 10/05/2023 1454   GLUCOSE  97 10/05/2023 1454   BUN 11 10/05/2023 1454   CREATININE 0.79 10/05/2023 1454   CREATININE 0.91 03/15/2022 1437   CREATININE 0.96 10/11/2021 0918   CALCIUM 8.8 10/05/2023 1454   PROT 6.5 12/14/2023 1204   ALBUMIN 4.1 12/14/2023 1204   AST 23 12/14/2023 1204   AST 40 03/15/2022 1437   ALT 22 12/14/2023 1204   ALT 40 03/15/2022 1437   ALKPHOS 128 (H) 12/14/2023 1204   BILITOT 0.7 12/14/2023 1204   BILITOT 0.6 03/15/2022 1437   GFRNONAA >60 03/15/2022 1437   GFRNONAA 85 01/01/2020 1656   GFRAA 98 01/01/2020 1656      Latest Ref Rng & Units 12/14/2023   12:04 PM 10/09/2023    9:37 AM 10/05/2023    2:54 PM  Hepatic Function  Total Protein 6.0 - 8.3 g/dL 6.5  6.6  6.4   Albumin 3.5 - 5.2 g/dL 4.1   4.0   AST 0 - 37 U/L 23  38  35    31   ALT 0 - 35 U/L 22  27  28    26    Alk Phosphatase 39 - 117 U/L 128   154    125   Total Bilirubin 0.2 - 1.2 mg/dL  0.7  0.7  0.5   Bilirubin, Direct 0.0 - 0.3 mg/dL 0.2  0.2        Latest Ref Rng & Units 01/27/2022    8:50 AM  Hepatitis C  AFP ng/mL 3.2     Current Medications:   Current Outpatient Medications (Endocrine & Metabolic):    levothyroxine  (SYNTHROID ) 50 MCG tablet, TAKE 1 TABLET BY MOUTH EVERY DAY BEFORE BREAKFAST   methylPREDNISolone  (MEDROL ) 4 MG tablet, Take in tapering course as directed 6-5-4-3-2-1 (Patient not taking: Reported on 01/04/2024)  Current Outpatient Medications (Cardiovascular):    ramipril  (ALTACE ) 10 MG capsule, TAKE 1 CAPSULE BY MOUTH EVERY DAY  Current Outpatient Medications (Respiratory):    benzonatate  (TESSALON ) 100 MG capsule, Take 1 capsule (100 mg total) by mouth 3 (three) times daily as needed.   fluticasone -salmeterol (ADVAIR HFA) 230-21 MCG/ACT inhaler, INHALE 2 PUFFS INTO THE LUNGS TWICE A DAY  Current Outpatient Medications (Analgesics):    Diclofenac Sodium CR 100 MG 24 hr tablet, Take 1 tablet (100 mg total) by mouth daily.   HYDROcodone -acetaminophen  (NORCO) 10-325 MG tablet, One half to one tab with food every 8 hours as needed for radiculopathy   Current Outpatient Medications (Other):    b complex vitamins tablet, Take 1 tablet by mouth daily.   BIOTIN 5000 PO, Take by mouth.   buPROPion  (WELLBUTRIN  XL) 150 MG 24 hr tablet, TAKE 1 TABLET BY MOUTH EVERY DAY   FLUoxetine  (PROZAC ) 20 MG capsule, TAKE 2 CAPSULES BY MOUTH EVERY DAY   GLUCOSAMINE SULFATE PO, Take by mouth daily.   lidocaine  (LIDODERM ) 5 %, Place 1 patch onto the skin daily. Remove & Discard patch within 12 hours or as directed by MD   Milk Thistle 1000 MG CAPS, Take by mouth. Pt states mg are 1300 mg   pantoprazole  (PROTONIX ) 40 MG tablet, Take 1 tablet (40 mg total) by mouth daily.  Medical History:  Past Medical History:  Diagnosis Date   Hyperlipidemia    Hypertension    Osteopenia    Allergies:  Allergies  Allergen Reactions   Bee Venom Other (See Comments)      Surgical History:  She  has a past surgical history that includes hemithyroidectomy. Family History:  Her family history includes Colon cancer in her brother; Hypertension in her mother.  REVIEW OF SYSTEMS  : All other systems reviewed and negative except where noted in the History of Present Illness.  PHYSICAL EXAM: BP 122/64   Pulse 73   Ht 5' 3 (1.6 m)   Wt 132 lb (59.9 kg)   BMI 23.38 kg/m  General Appearance: Well nourished, in no apparent distress. Head:   Normocephalic and atraumatic. Eyes:  sclerae anicteric,conjunctive pink  Respiratory: Respiratory effort normal, BS equal bilaterally without rales, rhonchi, wheezing. Cardio: RRR with no MRGs. Peripheral pulses intact.  Abdomen: Soft,  Obese ,active bowel sounds. No tenderness . Without guarding and Without rebound. No masses. Rectal: Not evaluated Musculoskeletal: Full ROM, Normal gait. Without edema. Skin:  Dry and intact without significant lesions or rashes Neuro: Alert and  oriented x4;  No focal deficits. Psych:  Cooperative. Normal mood and affect.    Alan JONELLE Coombs, PA-C 3:38 PM

## 2024-01-07 ENCOUNTER — Encounter: Payer: Self-pay | Admitting: Internal Medicine

## 2024-01-08 DIAGNOSIS — M79604 Pain in right leg: Secondary | ICD-10-CM | POA: Diagnosis not present

## 2024-01-08 DIAGNOSIS — M4726 Other spondylosis with radiculopathy, lumbar region: Secondary | ICD-10-CM | POA: Diagnosis not present

## 2024-01-09 ENCOUNTER — Other Ambulatory Visit: Payer: Self-pay | Admitting: Internal Medicine

## 2024-01-09 ENCOUNTER — Ambulatory Visit (INDEPENDENT_AMBULATORY_CARE_PROVIDER_SITE_OTHER): Admitting: Internal Medicine

## 2024-01-09 ENCOUNTER — Encounter: Payer: Self-pay | Admitting: Internal Medicine

## 2024-01-09 ENCOUNTER — Ambulatory Visit: Admitting: Internal Medicine

## 2024-01-09 VITALS — BP 130/70 | HR 84 | Ht 63.0 in | Wt 130.0 lb

## 2024-01-09 DIAGNOSIS — R600 Localized edema: Secondary | ICD-10-CM

## 2024-01-09 DIAGNOSIS — M5431 Sciatica, right side: Secondary | ICD-10-CM | POA: Diagnosis not present

## 2024-01-09 MED ORDER — HYDROCODONE-ACETAMINOPHEN 5-325 MG PO TABS: 1.0000 | ORAL_TABLET | Freq: Four times a day (QID) | ORAL | 0 refills | Status: AC | PRN

## 2024-01-09 NOTE — Patient Instructions (Signed)
 Have refilled Norco 5/325 for 5 days (#15) tablets to take sparingly for intractable pain. Have not refilled steroids as has not helped. See  Neurosurgeon next week for assessment.

## 2024-01-09 NOTE — Progress Notes (Signed)
   Subjective:    Patient ID: Alexandra Taylor, female    DOB: 01-19-1951, 73 y.o.   MRN: [id number]  HPI Discussed the use of AI scribe software for clinical note transcription with the patient, who gave verbal consent to proceed.  History of Present Illness   [name] is a 73 year old female who presents with intractable back pain and numbness in the foot.  Back pain - Severe, intractable pain since September following a trip to [place] - Pain persists for several months despite multiple treatments - Physical therapy, chiropractic care with laser therapy, and epidural steroid injection provided no relief - Pain managed with nightly narcotic medication, which aids sleep but does not alleviate daytime pain - Lyrica and Tylenol  ineffective; Tylenol  causes stomach upset - Pain is exacerbated by sitting and driving; comfort only found in standing or lying down - Scheduled for neurosurgical evaluation  Lower extremity numbness - Numbness present in the right foot  Lower extremity edema - Swelling in the legs attributed to prolonged standing - Compression socks worn to manage swelling  Functional impairment - Unable to walk her dog due to pain and numbness - Significant impact on daily activities and quality of life         Review of Systems  Neurological:  Positive for numbness.      Unable to sleep due to pain. Decreased activity due to pain. Unable to exercise due to pain.    Objective:   Physical Exam   VS reviewed   Muscle strength is normal in right LE. Straight leg raising positive. Spasm present.      Assessment & Plan:   Assessment and Plan    Lumbar radiculopathy with intractable pain. Norco helps but does not eliminate the pain entirely. Chronic lumbar radiculopathy likely due to lumbar disc herniation. Previous treatments ineffective. Severe pain affecting sleep and daily activities. Surgery would be  considered if MRI confirms nerve  compression. Epidural steroid injection did not help. - Refilled narcotics for five days to aid sleep until neurosurgical consultation. Will be seeing Neurosurgeon on Monday. - Advised no heavy lifting. - Recommended pillow positioning for comfort.  Lower extremity edema Recent onset possibly related to prolonged standing or steroid use. Compression stockings difficult to manage.

## 2024-01-10 ENCOUNTER — Other Ambulatory Visit: Payer: Self-pay

## 2024-01-10 DIAGNOSIS — M4726 Other spondylosis with radiculopathy, lumbar region: Secondary | ICD-10-CM | POA: Diagnosis not present

## 2024-01-10 DIAGNOSIS — M79604 Pain in right leg: Secondary | ICD-10-CM | POA: Diagnosis not present

## 2024-01-10 MED ORDER — BENZONATATE 100 MG PO CAPS: ORAL_CAPSULE | ORAL | 0 refills | Status: AC

## 2024-01-14 DIAGNOSIS — M4316 Spondylolisthesis, lumbar region: Secondary | ICD-10-CM | POA: Diagnosis not present

## 2024-01-14 DIAGNOSIS — M4726 Other spondylosis with radiculopathy, lumbar region: Secondary | ICD-10-CM | POA: Diagnosis not present

## 2024-01-15 ENCOUNTER — Encounter: Payer: Self-pay | Admitting: Internal Medicine

## 2024-01-16 DIAGNOSIS — M9905 Segmental and somatic dysfunction of pelvic region: Secondary | ICD-10-CM | POA: Diagnosis not present

## 2024-01-16 DIAGNOSIS — M9903 Segmental and somatic dysfunction of lumbar region: Secondary | ICD-10-CM | POA: Diagnosis not present

## 2024-01-16 DIAGNOSIS — L578 Other skin changes due to chronic exposure to nonionizing radiation: Secondary | ICD-10-CM | POA: Diagnosis not present

## 2024-01-16 DIAGNOSIS — M25551 Pain in right hip: Secondary | ICD-10-CM | POA: Diagnosis not present

## 2024-01-16 DIAGNOSIS — M4316 Spondylolisthesis, lumbar region: Secondary | ICD-10-CM | POA: Diagnosis not present

## 2024-01-16 DIAGNOSIS — G4733 Obstructive sleep apnea (adult) (pediatric): Secondary | ICD-10-CM | POA: Diagnosis not present

## 2024-01-16 DIAGNOSIS — L821 Other seborrheic keratosis: Secondary | ICD-10-CM | POA: Diagnosis not present

## 2024-01-17 DIAGNOSIS — M4316 Spondylolisthesis, lumbar region: Secondary | ICD-10-CM | POA: Diagnosis not present

## 2024-01-17 DIAGNOSIS — M9905 Segmental and somatic dysfunction of pelvic region: Secondary | ICD-10-CM | POA: Diagnosis not present

## 2024-01-17 DIAGNOSIS — M25551 Pain in right hip: Secondary | ICD-10-CM | POA: Diagnosis not present

## 2024-01-17 DIAGNOSIS — M9903 Segmental and somatic dysfunction of lumbar region: Secondary | ICD-10-CM | POA: Diagnosis not present

## 2024-01-21 DIAGNOSIS — M25551 Pain in right hip: Secondary | ICD-10-CM | POA: Diagnosis not present

## 2024-01-21 DIAGNOSIS — M9905 Segmental and somatic dysfunction of pelvic region: Secondary | ICD-10-CM | POA: Diagnosis not present

## 2024-01-21 DIAGNOSIS — M4316 Spondylolisthesis, lumbar region: Secondary | ICD-10-CM | POA: Diagnosis not present

## 2024-01-21 DIAGNOSIS — M9903 Segmental and somatic dysfunction of lumbar region: Secondary | ICD-10-CM | POA: Diagnosis not present

## 2024-01-23 DIAGNOSIS — M25551 Pain in right hip: Secondary | ICD-10-CM | POA: Diagnosis not present

## 2024-01-23 DIAGNOSIS — M9903 Segmental and somatic dysfunction of lumbar region: Secondary | ICD-10-CM | POA: Diagnosis not present

## 2024-01-23 DIAGNOSIS — M9905 Segmental and somatic dysfunction of pelvic region: Secondary | ICD-10-CM | POA: Diagnosis not present

## 2024-01-23 DIAGNOSIS — M4316 Spondylolisthesis, lumbar region: Secondary | ICD-10-CM | POA: Diagnosis not present

## 2024-01-25 ENCOUNTER — Telehealth: Payer: Self-pay | Admitting: Physician Assistant

## 2024-01-25 NOTE — Telephone Encounter (Signed)
 Telephone Call   01/25/24  Today, patient tells me she was started on Hydrocodone  for some sciatica and she has not had a bowel movement in a week.  She is still passing gas though it is few and far between.  Probably 3 days ago she started taking some laxatives including an Ex-Lax knock off a couple of times as well as stool softeners 2-3 a day for a few days and she just tried a suppository but it would not stay in, she has been unable to have a stool at all.  Wondering what to do  Recommend the patient buy some MiraLAX and take 4-8 doses today.  Discussed increasing water intake.  If she has no movement by tomorrow she will let us  know.  If for some reason she starts with nausea or vomiting or her abdominal pain increases then would recommend she come to the ER.  Delon Failing, PA-C

## 2024-01-26 ENCOUNTER — Ambulatory Visit (HOSPITAL_COMMUNITY): Admission: EM | Admit: 2024-01-26 | Discharge: 2024-01-26 | Disposition: A | Discharge status: Home / Self Care

## 2024-01-26 ENCOUNTER — Emergency Department (HOSPITAL_COMMUNITY)

## 2024-01-26 ENCOUNTER — Encounter (HOSPITAL_COMMUNITY): Payer: Self-pay

## 2024-01-26 ENCOUNTER — Encounter (HOSPITAL_COMMUNITY): Payer: Self-pay | Admitting: *Deleted

## 2024-01-26 ENCOUNTER — Emergency Department (HOSPITAL_COMMUNITY)
Admission: EM | Admit: 2024-01-26 | Discharge: 2024-01-26 | Disposition: A | Discharge status: Home / Self Care | Source: Ambulatory Visit | Attending: Emergency Medicine | Admitting: Emergency Medicine

## 2024-01-26 ENCOUNTER — Other Ambulatory Visit: Payer: Self-pay

## 2024-01-26 DIAGNOSIS — R1030 Lower abdominal pain, unspecified: Secondary | ICD-10-CM | POA: Insufficient documentation

## 2024-01-26 DIAGNOSIS — K449 Diaphragmatic hernia without obstruction or gangrene: Secondary | ICD-10-CM | POA: Diagnosis not present

## 2024-01-26 DIAGNOSIS — E871 Hypo-osmolality and hyponatremia: Secondary | ICD-10-CM | POA: Diagnosis not present

## 2024-01-26 DIAGNOSIS — R188 Other ascites: Secondary | ICD-10-CM | POA: Diagnosis not present

## 2024-01-26 DIAGNOSIS — R1084 Generalized abdominal pain: Secondary | ICD-10-CM | POA: Diagnosis not present

## 2024-01-26 DIAGNOSIS — K5730 Diverticulosis of large intestine without perforation or abscess without bleeding: Secondary | ICD-10-CM | POA: Diagnosis not present

## 2024-01-26 DIAGNOSIS — K5900 Constipation, unspecified: Secondary | ICD-10-CM

## 2024-01-26 LAB — URINALYSIS, ROUTINE W REFLEX MICROSCOPIC
Bilirubin Urine: NEGATIVE
Glucose, UA: NEGATIVE mg/dL
Hgb urine dipstick: NEGATIVE
Ketones, ur: 15 mg/dL — AB
Leukocytes,Ua: NEGATIVE
Nitrite: NEGATIVE
Protein, ur: NEGATIVE mg/dL
Specific Gravity, Urine: 1.01 (ref 1.005–1.030)
pH: 7 (ref 5.0–8.0)

## 2024-01-26 LAB — COMPREHENSIVE METABOLIC PANEL WITH GFR
ALT: 23 U/L (ref 0–44)
AST: 37 U/L (ref 15–41)
Albumin: 3.3 g/dL — ABNORMAL LOW (ref 3.5–5.0)
Alkaline Phosphatase: 75 U/L (ref 38–126)
Anion gap: 14 (ref 5–15)
BUN: 5 mg/dL — ABNORMAL LOW (ref 8–23)
CO2: 20 mmol/L — ABNORMAL LOW (ref 22–32)
Calcium: 8.5 mg/dL — ABNORMAL LOW (ref 8.9–10.3)
Chloride: 91 mmol/L — ABNORMAL LOW (ref 98–111)
Creatinine, Ser: 0.75 mg/dL (ref 0.44–1.00)
GFR, Estimated: >60 mL/min (ref >60)
Glucose, Bld: 116 mg/dL — ABNORMAL HIGH (ref 70–99)
Potassium: 3.2 mmol/L — ABNORMAL LOW (ref 3.5–5.1)
Sodium: 125 mmol/L — ABNORMAL LOW (ref 135–145)
Total Bilirubin: 0.9 mg/dL (ref 0.0–1.2)
Total Protein: 5.5 g/dL — ABNORMAL LOW (ref 6.5–8.1)

## 2024-01-26 LAB — CBC WITH DIFFERENTIAL/PLATELET
Abs Immature Granulocytes: 0.05 K/uL (ref 0.00–0.07)
Basophils Absolute: 0 K/uL (ref 0.0–0.1)
Basophils Relative: 0 %
Eosinophils Absolute: 0 K/uL (ref 0.0–0.5)
Eosinophils Relative: 0 %
HCT: 38.2 % (ref 36.0–46.0)
Hemoglobin: 13.6 g/dL (ref 12.0–15.0)
Immature Granulocytes: 1 %
Lymphocytes Relative: 10 %
Lymphs Abs: 1 K/uL (ref 0.7–4.0)
MCH: 34.7 pg — ABNORMAL HIGH (ref 26.0–34.0)
MCHC: 35.6 g/dL (ref 30.0–36.0)
MCV: 97.4 fL (ref 80.0–100.0)
Monocytes Absolute: 1.2 K/uL — ABNORMAL HIGH (ref 0.1–1.0)
Monocytes Relative: 12 %
Neutro Abs: 7.5 K/uL (ref 1.7–7.7)
Neutrophils Relative %: 77 %
Platelets: 281 K/uL (ref 150–400)
RBC: 3.92 MIL/uL (ref 3.87–5.11)
RDW: 13.2 % (ref 11.5–15.5)
WBC: 9.9 K/uL (ref 4.0–10.5)
nRBC: 0 % (ref 0.0–0.2)

## 2024-01-26 LAB — LIPASE, BLOOD: Lipase: 22 U/L (ref 11–51)

## 2024-01-26 MED ORDER — SODIUM CHLORIDE 0.9 % IV BOLUS
1000.0000 mL | Freq: Once | INTRAVENOUS | Status: AC
  Administered 2024-01-26: 1000 mL via INTRAVENOUS

## 2024-01-26 MED ORDER — ONDANSETRON 4 MG PO TBDP: 4.0000 mg | ORAL_TABLET | Freq: Three times a day (TID) | ORAL | 0 refills | Status: DC | PRN

## 2024-01-26 MED ORDER — AMOXICILLIN-POT CLAVULANATE 875-125 MG PO TABS
1.0000 | ORAL_TABLET | Freq: Once | ORAL | Status: AC
Start: 2024-01-26 — End: 2024-01-26
  Administered 2024-01-26: 1 via ORAL
  Filled 2024-01-26: qty 1

## 2024-01-26 MED ORDER — IOHEXOL 350 MG/ML SOLN
75.0000 mL | Freq: Once | INTRAVENOUS | Status: AC | PRN
  Administered 2024-01-26: 75 mL via INTRAVENOUS

## 2024-01-26 MED ORDER — ONDANSETRON HCL 4 MG/2ML IJ SOLN
4.0000 mg | Freq: Once | INTRAMUSCULAR | Status: AC
  Administered 2024-01-26: 4 mg via INTRAVENOUS
  Filled 2024-01-26: qty 2

## 2024-01-26 MED ORDER — KETOROLAC TROMETHAMINE 15 MG/ML IJ SOLN
15.0000 mg | Freq: Once | INTRAMUSCULAR | Status: AC
  Administered 2024-01-26: 15 mg via INTRAVENOUS
  Filled 2024-01-26: qty 1

## 2024-01-26 MED ORDER — AMOXICILLIN-POT CLAVULANATE 875-125 MG PO TABS: 1.0000 | ORAL_TABLET | Freq: Two times a day (BID) | ORAL | 0 refills | Status: DC

## 2024-01-26 NOTE — ED Triage Notes (Signed)
 C/o abd. Pain was seen at [facility] and told to come to ed for further eval, using oxcy for back pain states she is only able to pass a small amt of stool.

## 2024-01-26 NOTE — ED Notes (Signed)
 Pt attempted to void for urine sample. Pt unable at this time. Pt had episode of incontinence before coming due to episode of emesis.

## 2024-01-26 NOTE — ED Notes (Signed)
 Pt ambulated to the bathroom, pt tolerated well. Pt unable to urinate for a urine sample. States she will try again later.

## 2024-01-26 NOTE — ED Provider Notes (Signed)
 [place] EMERGENCY DEPARTMENT AT [facility] Provider Note   CSN: [id number] Arrival date & time: 01/26/24  1054     Patient presents with: Abdominal Pain   Alexandra Taylor is a 73 y.o. female.   Patient with history of endometriosis surgery, no other abdominal surgeries, currently on opioid medications for sciatica pain --presents with 2 to 3-day history of generalized abdominal pain, vomiting (about 3 times total), constipation.  Patient states that she feels like she has to use the bathroom frequently, but only passes a small amount of hard stool.  No blood in the stool.  No rectal pain.  She feels like she cannot find a comfortable position currently.  She was seen in urgent care, referred to the emergency department.  She has recently been taking double dose of MiraLAX, once yesterday and again today without any results.       Prior to Admission medications   Medication Sig Start Date End Date Taking? Authorizing Provider  b complex vitamins tablet Take 1 tablet by mouth daily.    [provider]  benzonatate  (TESSALON ) 100 MG capsule TAKE 1 CAPSULE BY MOUTH THREE TIMES A DAY AS NEEDED 01/10/24   Perri Ronal PARAS, MD  BIOTIN 5000 PO Take by mouth.    [provider]  buPROPion  (WELLBUTRIN  XL) 150 MG 24 hr tablet TAKE 1 TABLET BY MOUTH EVERY DAY 10/11/23   Perri Ronal PARAS, MD  Diclofenac  Sodium CR 100 MG 24 hr tablet Take 1 tablet (100 mg total) by mouth daily. 12/11/23   Palumbo, April, MD  FLUoxetine  (PROZAC ) 20 MG capsule TAKE 2 CAPSULES BY MOUTH EVERY DAY 11/07/23   Perri Ronal PARAS, MD  fluticasone -salmeterol (ADVAIR HFA) 230-21 MCG/ACT inhaler INHALE 2 PUFFS INTO THE LUNGS TWICE A DAY 10/30/23   Perri Ronal PARAS, MD  GLUCOSAMINE SULFATE PO Take by mouth daily.    [provider]  HYDROcodone -acetaminophen  (NORCO/VICODIN) 5-325 MG tablet Take 1 tablet by mouth every 6 (six) hours as needed for moderate pain (pain score 4-6). 01/09/24   Perri Ronal PARAS, MD  levothyroxine  (SYNTHROID ) 50 MCG tablet TAKE 1 TABLET BY MOUTH EVERY DAY BEFORE BREAKFAST 08/20/23   Perri Ronal PARAS, MD  lidocaine  (LIDODERM ) 5 % Place 1 patch onto the skin daily. Remove & Discard patch within 12 hours or as directed by MD 12/11/23   Nettie, April, MD  Milk Thistle 1000 MG CAPS Take by mouth. Pt states mg are 1300 mg    [provider]  pantoprazole  (PROTONIX ) 40 MG tablet Take 1 tablet (40 mg total) by mouth daily. 10/25/23   Perri Ronal PARAS, MD  ramipril  (ALTACE ) 10 MG capsule TAKE 1 CAPSULE BY MOUTH EVERY DAY 11/15/23   Perri Ronal PARAS, MD    Allergies: Bee venom    Review of Systems  Updated Vital Signs BP (!) 166/86 (BP Location: Right Arm)   Pulse 80   Temp 98.1 F (36.7 C) (Oral)   Resp 18   Ht 5' 3 (1.6 m)   Wt 56.7 kg   SpO2 100%   BMI 22.14 kg/m   Physical Exam Vitals and nursing note reviewed.  Constitutional:      General: She is in acute distress.     Appearance: She is well-developed. She is not ill-appearing.     Comments: Patient looks uncomfortable, but nontoxic  HENT:     Head: Normocephalic and atraumatic.     Right Ear: External ear normal.  Left Ear: External ear normal.     Nose: Nose normal.  Eyes:     Conjunctiva/sclera: Conjunctivae normal.  Cardiovascular:     Rate and Rhythm: Normal rate and regular rhythm.     Heart sounds: No murmur heard. Pulmonary:     Effort: No respiratory distress.     Breath sounds: No wheezing, rhonchi or rales.  Abdominal:     Palpations: Abdomen is soft.     Tenderness: There is generalized abdominal tenderness. There is no guarding or rebound. Negative signs include Murphy's sign and McBurney's sign.  Musculoskeletal:     Cervical back: Normal range of motion and neck supple.     Right lower leg: No edema.     Left lower leg: No edema.  Skin:    General: Skin is warm and dry.     Findings: No rash.  Neurological:     General: No focal deficit present.     Mental  Status: She is alert. Mental status is at baseline.     Motor: No weakness.  Psychiatric:        Mood and Affect: Mood normal.     (all labs ordered are listed, but only abnormal results are displayed) Labs Reviewed  CBC WITH DIFFERENTIAL/PLATELET - Abnormal; Notable for the following components:      Result Value   MCH 34.7 (*)    Monocytes Absolute 1.2 (*)    All other components within normal limits  COMPREHENSIVE METABOLIC PANEL WITH GFR - Abnormal; Notable for the following components:   Sodium 125 (*)    Potassium 3.2 (*)    Chloride 91 (*)    CO2 20 (*)    Glucose, Bld 116 (*)    BUN 5 (*)    Calcium 8.5 (*)    Total Protein 5.5 (*)    Albumin 3.3 (*)    All other components within normal limits  URINALYSIS, ROUTINE W REFLEX MICROSCOPIC - Abnormal; Notable for the following components:   Ketones, ur 15 (*)    All other components within normal limits  LIPASE, BLOOD    EKG: None  Radiology: CT ABDOMEN PELVIS W CONTRAST Result Date: 01/26/2024 CLINICAL DATA:  Generalized abdominal pain with vomiting. Constipation. EXAM: CT ABDOMEN AND PELVIS WITH CONTRAST TECHNIQUE: Multidetector CT imaging of the abdomen and pelvis was performed using the standard protocol following bolus administration of intravenous contrast. RADIATION DOSE REDUCTION: This exam was performed according to the departmental dose-optimization program which includes automated exposure control, adjustment of the mA and/or kV according to patient size and/or use of iterative reconstruction technique. CONTRAST:  75mL OMNIPAQUE  IOHEXOL  350 MG/ML SOLN COMPARISON:  02/15/2022. FINDINGS: Lower chest: No acute abnormality. Hepatobiliary: No focal liver abnormality is seen. No gallstones, gallbladder wall thickening, or biliary dilatation. Pancreas: Unremarkable. No pancreatic ductal dilatation or surrounding inflammatory changes. Spleen: Normal in size without focal abnormality. Adrenals/Urinary Tract: Adrenal glands  are unremarkable. Kidneys are normal, without renal calculi, focal lesion, or hydronephrosis. Bladder is unremarkable. Stomach/Bowel: Moderate increased stool burden throughout the colon. There is inflammatory type haziness as well as a small amount of free fluid in the pelvis, lying adjacent to the rectum and distal sigmoid colon as well as the uterus and adnexa. There are several sigmoid colon diverticula, but no definitive diverticulitis. The etiology of the apparent inflammation and free fluid is clear. Stomach is mostly decompressed, otherwise unremarkable. Minimal hiatal hernia. Small bowel is normal in caliber with no wall thickening or convincing inflammation. Vascular/Lymphatic:  Minimal aortic atherosclerosis. No aneurysm. No enlarged lymph nodes. Reproductive: Normal postmenopausal uterus.  No adnexal masses. Other: Small amount of ascites, in the pelvis as described above as well as adjacent to the liver and spleen and along the right pericolic gutter. Musculoskeletal: No fracture or acute finding.  No bone lesion. IMPRESSION: 1. Nonspecific inflammatory change noted in the pelvis, as well as a small amount free fluid. Etiology of this is unclear. Moderate increased stool throughout the colon and there are several sigmoid colon diverticula, but no convincing diverticulitis or other colonic inflammatory process. 2. No other evidence of an acute abnormality. Electronically Signed   By: Alm Parkins M.D.   On: 01/26/2024 14:50     Procedures   Medications Ordered in the ED  sodium chloride  0.9 % bolus 1,000 mL (0 mLs Intravenous Stopped 01/26/24 1341)  ondansetron  (ZOFRAN ) injection 4 mg (4 mg Intravenous Given 01/26/24 1124)  iohexol  (OMNIPAQUE ) 350 MG/ML injection 75 mL (75 mLs Intravenous Contrast Given 01/26/24 1408)  amoxicillin-clavulanate (AUGMENTIN) 875-125 MG per tablet 1 tablet (1 tablet Oral Given 01/26/24 1526)  ketorolac  (TORADOL ) 15 MG/ML injection 15 mg (15 mg Intravenous Given  01/26/24 1526)   ED Course  Patient seen and examined. History obtained directly from patient.   Labs/EKG: Ordered CBC, CMP, lipase, UA.  Imaging: Ordered CT abdomen pelvis.  Medications/Fluids: Ordered: Normal saline bolus, Zofran .   Most recent vital signs reviewed and are as follows: BP (!) 166/86 (BP Location: Right Arm)   Pulse 80   Temp 98.1 F (36.7 C) (Oral)   Resp 18   Ht 5' 3 (1.6 m)   Wt 56.7 kg   SpO2 100%   BMI 22.14 kg/m   Initial impression: Generalized abdominal pain with vomiting, constipation.  She will need imaging to rule out bowel obstruction or look for other signs of pain/infection.  If no concerning findings, may be due to constipation.  4:04 PM Reassessment performed. Patient appears able, comfortable.  She was requesting ibuprofen for pain, given dose of IV Toradol .  Labs personally reviewed and interpreted including: CBC shows normal white blood cell count of 9.9, normal hemoglobin; CMP with hyponatremia at 125, sodium was 127 3 months ago, potassium low at 3.2, chloride 91, normal anion gap; lipase normal; UA without signs of infection.  Imaging personally visualized and interpreted including: CT of the abdomen pelvis shows nonspecific inflammatory change, small amount of free fluid, etiology unclear, moderate stool burden.  Reviewed pertinent lab work and imaging with patient at bedside. Questions answered.   Most current vital signs reviewed and are as follows: BP (!) 153/85   Pulse 85   Temp 98.3 F (36.8 C) (Oral)   Resp 15   Ht 5' 3 (1.6 m)   Wt 56.7 kg   SpO2 100%   BMI 22.14 kg/m   Plan: Discharge to home. Discussed with Dr. Francesca, reviewed CT imaging results. Will treat at diverticulitis/colitis.   Prescriptions written for: Augmentin, Zofran   Other home care instructions discussed: Continued use of MiraLAX to help with constipation, would avoid enema/suppository given inflammation in the sigmoid colon  ED return  instructions discussed: The patient was urged to return to the Emergency Department immediately with worsening of current symptoms, worsening abdominal pain, persistent vomiting, blood noted in stools, fever, or any other concerns. The patient verbalized understanding.   Follow-up instructions discussed: Patient encouraged to follow-up with their PCP in 3 days.  Medical Decision Making Amount and/or Complexity of Data Reviewed Labs: ordered. Radiology: ordered.  Risk Prescription drug management.   For this patient's complaint of abdominal pain, the following conditions were considered on the differential diagnosis: gastritis/PUD, enteritis/duodenitis, appendicitis, cholelithiasis/cholecystitis, cholangitis, pancreatitis, ruptured viscus, colitis, diverticulitis, small/large bowel obstruction, proctitis, cystitis, pyelonephritis, ureteral colic, aortic dissection, aortic aneurysm. In women, ectopic pregnancy, pelvic inflammatory disease, ovarian cysts, and tubo-ovarian abscess were also considered. Atypical chest etiologies were also considered including ACS, PE, and pneumonia.  Patient has signs of inflammation on the abdomen with some free fluid.  Constipation is likely secondary to this.  Patient looks well, nontoxic.  Normal white blood cell count, no fever.  Pain is controlled.  Will start Augmentin, have patient follow-up with her PCP.  She seems reliable to return with worsening.  Also with some electrolyte disturbance, under lying etiology unclear.  Patient did have a sodium of 127 a few months ago so this is likely more chronic in nature.  I feel that this can be followed up closely with her PCP.  The patient's vital signs, pertinent lab work and imaging were reviewed and interpreted as discussed in the ED course. Hospitalization was considered for further testing, treatments, or serial exams/observation. However as patient is well-appearing, has a  stable exam, and reassuring studies today, I do not feel that they warrant admission at this time. This plan was discussed with the patient who verbalizes agreement and comfort with this plan and seems reliable and able to return to the Emergency Department with worsening or changing symptoms.       Final diagnoses:  Lower abdominal pain  Hyponatremia    ED Discharge Orders          Ordered    amoxicillin-clavulanate (AUGMENTIN) 875-125 MG tablet  Every 12 hours        01/26/24 1538    ondansetron  (ZOFRAN -ODT) 4 MG disintegrating tablet  Every 8 hours PRN        01/26/24 1538               Desiderio Chew, PA-C 01/26/24 1610    Francesca Elsie CROME, MD 01/26/24 1806

## 2024-01-26 NOTE — ED Notes (Signed)
 Patient transported to CT

## 2024-01-26 NOTE — Discharge Instructions (Addendum)
 Please read and follow all provided instructions.  Your diagnoses today include:  1. Lower abdominal pain     Tests performed today include: Complete blood cell count: Normal white blood cell count Complete metabolic panel: Low sodium, low chloride (please have this followed up by your doctor) Lipase (pancreas function test): Was normal Urinalysis (urine test): No signs of infection CT scan of the abdomen shows inflammation and fluid in the lower pelvis and abdomen, unclear the source of this Vital signs. See below for your results today.   Medications prescribed:  Augmentin - antibiotic  You have been prescribed an antibiotic medicine: take the entire course of medicine even if you are feeling better. Stopping early can cause the antibiotic not to work.  Zofran  (ondansetron ) - for nausea and vomiting  Take any prescribed medications only as directed.  Home care instructions:  Follow any educational materials contained in this packet.  Follow-up instructions: Please follow-up with your primary care provider in the next 3 days for further evaluation of your symptoms.    Return instructions:  SEEK IMMEDIATE MEDICAL ATTENTION IF: The pain does not go away or becomes severe  A temperature above 101F develops  Repeated vomiting occurs (multiple episodes)  The pain becomes localized to portions of the abdomen. The right side could possibly be appendicitis. In an adult, the left lower portion of the abdomen could be colitis or diverticulitis.  Blood is being passed in stools or vomit (bright red or black tarry stools)  You develop chest pain, difficulty breathing, dizziness or fainting, or become confused, poorly responsive, or inconsolable (young children) If you have any other emergent concerns regarding your health  Additional Information: Abdominal (belly) pain can be caused by many things. Your caregiver performed an examination and possibly ordered blood/urine tests and imaging  (CT scan, x-rays, ultrasound). Many cases can be observed and treated at home after initial evaluation in the emergency department. Even though you are being discharged home, abdominal pain can be unpredictable. Therefore, you need a repeated exam if your pain does not resolve, returns, or worsens. Most patients with abdominal pain don't have to be admitted to the hospital or have surgery, but serious problems like appendicitis and gallbladder attacks can start out as nonspecific pain. Many abdominal conditions cannot be diagnosed in one visit, so follow-up evaluations are very important.  Your vital signs today were: BP (!) 153/85   Pulse 85   Temp 98.3 F (36.8 C) (Oral)   Resp 15   Ht 5' 3 (1.6 m)   Wt 56.7 kg   SpO2 100%   BMI 22.14 kg/m  If your blood pressure (bp) was elevated above 135/85 this visit, please have this repeated by your doctor within one month. --------------

## 2024-01-26 NOTE — ED Triage Notes (Signed)
 Patient presents to office for abdominal pain and cramping. Patient states last bowel movement was x 1 week ago.

## 2024-01-26 NOTE — Discharge Instructions (Signed)
Please go to the emergency department for further evaluation of your abdominal pain.

## 2024-01-26 NOTE — ED Provider Notes (Signed)
 [facility]    CSN: [id number] Arrival date & time: 01/26/24  0946      History   Chief Complaint Chief Complaint  Patient presents with   Abdominal Cramping    HPI CHARENE MCCALLISTER is a 73 y.o. female.   Patient presents with severe abdominal pain that began yesterday.  Patient states that she has not had a bowel movement for 1 week.  Patient states that she feels the urge to have a bowel movement but only a very small amount will come out.  Patient states that she is also had some nausea and vomiting with this.  Patient states that she has tried taking double doses of MiraLAX and attempted a suppository without relief.  Denies rectal bleeding, blood in vomit, fever, body aches, and chills.  The history is provided by the patient and medical records.  Abdominal Cramping    Past Medical History:  Diagnosis Date   Hyperlipidemia    Hypertension    Osteopenia     Patient Active Problem List   Diagnosis Date Noted   Acquired trigger finger of left middle finger 01/12/2022   Benign paroxysmal positional vertigo, bilateral 08/11/2021   Sensorineural hearing loss (SNHL), bilateral 08/11/2021   Encounter for counseling 03/23/2020   Anxiety and depression 01/29/2012   Hypothyroidism 12/05/2010   Attention deficit disorder (ADD) 12/05/2010   Hypertension 12/05/2010    Past Surgical History:  Procedure Laterality Date   hemithyroidectomy      OB History   No obstetric history on file.      Home Medications    Prior to Admission medications   Medication Sig Start Date End Date Taking? Authorizing Provider  b complex vitamins tablet Take 1 tablet by mouth daily.   Yes [provider]  BIOTIN 5000 PO Take by mouth.   Yes [provider]  buPROPion  (WELLBUTRIN  XL) 150 MG 24 hr tablet TAKE 1 TABLET BY MOUTH EVERY DAY 10/11/23  Yes Baxley, Ronal PARAS, MD  FLUoxetine  (PROZAC ) 20 MG capsule TAKE 2 CAPSULES BY MOUTH EVERY DAY 11/07/23  Yes  Perri Ronal PARAS, MD  fluticasone -salmeterol (ADVAIR HFA) 230-21 MCG/ACT inhaler INHALE 2 PUFFS INTO THE LUNGS TWICE A DAY 10/30/23  Yes Baxley, Ronal PARAS, MD  GLUCOSAMINE SULFATE PO Take by mouth daily.   Yes [provider]  levothyroxine  (SYNTHROID ) 50 MCG tablet TAKE 1 TABLET BY MOUTH EVERY DAY BEFORE BREAKFAST 08/20/23  Yes Baxley, Ronal PARAS, MD  lidocaine  (LIDODERM ) 5 % Place 1 patch onto the skin daily. Remove & Discard patch within 12 hours or as directed by MD 12/11/23  Yes Palumbo, April, MD  Milk Thistle 1000 MG CAPS Take by mouth. Pt states mg are 1300 mg   Yes [provider]  pantoprazole  (PROTONIX ) 40 MG tablet Take 1 tablet (40 mg total) by mouth daily. 10/25/23  Yes Baxley, Ronal PARAS, MD  ramipril  (ALTACE ) 10 MG capsule TAKE 1 CAPSULE BY MOUTH EVERY DAY 11/15/23  Yes Baxley, Ronal PARAS, MD  benzonatate  (TESSALON ) 100 MG capsule TAKE 1 CAPSULE BY MOUTH THREE TIMES A DAY AS NEEDED 01/10/24   Perri Ronal PARAS, MD  Diclofenac  Sodium CR 100 MG 24 hr tablet Take 1 tablet (100 mg total) by mouth daily. 12/11/23   Palumbo, April, MD  HYDROcodone -acetaminophen  (NORCO/VICODIN) 5-325 MG tablet Take 1 tablet by mouth every 6 (six) hours as needed for moderate pain (pain score 4-6). 01/09/24   Perri Ronal PARAS, MD    Family History Family  History  Problem Relation Age of Onset   Hypertension Mother    Colon cancer Brother    Rectal cancer Neg Hx    Stomach cancer Neg Hx    Esophageal cancer Neg Hx     Social History Social History   Tobacco Use   Smoking status: Former    Current packs/day: 0.00    Average packs/day: 1 pack/day for 15.0 years (15.0 ttl pk-yrs)    Types: Cigarettes    Start date: 02/27/1969    Quit date: 02/28/1984    Years since quitting: 39.9   Smokeless tobacco: Never  Vaping Use   Vaping status: Never Used  Substance Use Topics   Alcohol use: Yes    Comment: Daily   Drug use: No     Allergies   Bee venom   Review of Systems Review of Systems  Per  HPI  Physical Exam Triage Vital Signs ED Triage Vitals  Encounter Vitals Group     BP 01/26/24 1036 (!) 170/85     Girls Systolic BP Percentile --      Girls Diastolic BP Percentile --      Boys Systolic BP Percentile --      Boys Diastolic BP Percentile --      Pulse Rate 01/26/24 1036 78     Resp 01/26/24 1036 20     Temp 01/26/24 1036 98.8 F (37.1 C)     Temp Source 01/26/24 1036 Oral     SpO2 01/26/24 1036 98 %     Weight --      Height --      Head Circumference --      Peak Flow --      Pain Score 01/26/24 1038 9     Pain Loc --      Pain Education --      Exclude from Growth Chart --    No data found.  Updated Vital Signs BP (!) 170/85 (BP Location: Left Arm)   Pulse 78   Temp 98.8 F (37.1 C) (Oral)   Resp 20   SpO2 98%   Visual Acuity Right Eye Distance:   Left Eye Distance:   Bilateral Distance:    Right Eye Near:   Left Eye Near:    Bilateral Near:     Physical Exam Vitals and nursing note reviewed.  Constitutional:      General: She is awake. She is not in acute distress.    Appearance: Normal appearance. She is well-developed and well-groomed. She is ill-appearing. She is not toxic-appearing or diaphoretic.  Abdominal:     General: Abdomen is flat. Bowel sounds are increased.     Palpations: Abdomen is rigid.     Tenderness: There is generalized abdominal tenderness. There is guarding.     Comments: Tenderness noted to generalized abdomen with guarding present.  Skin:    General: Skin is warm and dry.  Neurological:     Mental Status: She is alert.  Psychiatric:        Behavior: Behavior is cooperative.      UC Treatments / Results  Labs (all labs ordered are listed, but only abnormal results are displayed) Labs Reviewed - No data to display  EKG   Radiology No results found.  Procedures Procedures (including critical care time)  Medications Ordered in UC Medications - No data to display  Initial Impression / Assessment  and Plan / UC Course  I have reviewed the triage vital signs and the  nursing notes.  Pertinent labs & imaging results that were available during my care of the patient were reviewed by me and considered in my medical decision making (see chart for details).     Patient is mildly ill-appearing and appears to be in acute pain.  Vitals are overall stable but blood pressure is elevated at 170/85 likely related to pain.  Abdomen is rigid upon palpation.  Abdomen is significantly TTP with guarding present.  Recommended patient be seen in the emergency department due to severity of pain and for further evaluation to rule out bowel obstruction versus fecal impaction.  Patient is understanding and agreeable to plan at this time. Patient is stable to arrive to the ER via POV with family.  Final Clinical Impressions(s) / UC Diagnoses   Final diagnoses:  Generalized abdominal pain  Constipation, unspecified constipation type     Discharge Instructions      Please go to the emergency department for further evaluation of your abdominal pain.    ED Prescriptions   None    PDMP not reviewed this encounter.   Johnie Flaming A, NP 01/26/24 1055

## 2024-01-27 DIAGNOSIS — G4733 Obstructive sleep apnea (adult) (pediatric): Secondary | ICD-10-CM | POA: Diagnosis not present

## 2024-01-28 DIAGNOSIS — M9903 Segmental and somatic dysfunction of lumbar region: Secondary | ICD-10-CM | POA: Diagnosis not present

## 2024-01-28 DIAGNOSIS — M25551 Pain in right hip: Secondary | ICD-10-CM | POA: Diagnosis not present

## 2024-01-28 DIAGNOSIS — M9905 Segmental and somatic dysfunction of pelvic region: Secondary | ICD-10-CM | POA: Diagnosis not present

## 2024-01-28 DIAGNOSIS — M4316 Spondylolisthesis, lumbar region: Secondary | ICD-10-CM | POA: Diagnosis not present

## 2024-01-29 ENCOUNTER — Telehealth: Payer: Self-pay | Admitting: Physician Assistant

## 2024-01-29 ENCOUNTER — Telehealth: Payer: Self-pay | Admitting: Internal Medicine

## 2024-01-29 NOTE — Telephone Encounter (Signed)
 Inbound call from patient stating that she is scheduled for 1/21 at 2:10 with Dr. Charlanne to be seen for Colitis. She states that is unexcitable and needs to be seen  right away. I advised her that was the soonest that he had. Patient requested that I send a message to the nurse for a call back to discuss recommendations and to see if she can be seen with Dr. Charlanne before then. Please advise.

## 2024-01-31 NOTE — Telephone Encounter (Signed)
 Returned patient call & she has concerns regarding her treatment plan. ED visit on 11/29 for abdominal pain, n/v, constipation. Patient was d/c home with augmentin & zofran  for possible colitis. She denies any pain today, but feels very nauseous (no vomiting). She is passing gas & having very small bm's daily, however not eating very much. Tolerating bland foods, and even had lasagna last night. Advised patient to do full liquid diet for now until further recommendations can be made & discussed ED precautions. Rescheduled her appointment to soonest available 12/12 at 11:00 am with Dr. Charlanne.

## 2024-01-31 NOTE — Telephone Encounter (Signed)
 Patient calling in regards to previous note. Please advise.   Thank you

## 2024-02-01 ENCOUNTER — Other Ambulatory Visit: Payer: Self-pay

## 2024-02-01 MED ORDER — DICYCLOMINE HCL 20 MG PO TABS: 20.0000 mg | ORAL_TABLET | Freq: Four times a day (QID) | ORAL | 0 refills | Status: DC | PRN

## 2024-02-01 NOTE — Telephone Encounter (Signed)
 08/2023 colonoscopy - One 6 mm polyp in the distal ascending colon, removed with a cold snare. Resected and retrieved. - Mild sigmoid diverticulosis. - Internal hemorrhoids. Recall 5 years  Patient had recent colonoscopy that showed diverticulosis, from looking at the CT most likely this was a diverticulitis and/or infectious.  I think she does need continue the Augmentin  and Zofran .  But also send in dicyclomine  20 mg as needed every 6 hours for abdominal pain, #60 no refills as needed for abdominal pain. Do diverticular diet Follow-up with Dr. Charlanne Advised to go to the ER if there is any severe abdominal pain, unable to hold down food/water, blood in stool or vomit, chest pain, shortness of breath, or any worsening symptoms.     During an Acute Flare-Up (Clear Liquid to Low-Fiber Diet) The goal is to reduce irritation and let your colon rest.  Day 1-3: Clear Liquid Diet Water  Broth (chicken, beef, or vegetable)  Clear juices (apple, white grape - avoid citrus)  Ice pops without pulp or seeds  Gelatin (no fruit or seeds)  Tea or coffee (no cream or dairy)  After Symptoms Improve: Low-Fiber Diet Gradually transition to low-fiber foods for easier digestion.  Sample Foods White rice, pasta, or plain white bread  Cooked or canned vegetables without skins or seeds (e.g., carrots, green beans, potatoes)  Eggs, fish, or poultry  Low-fiber cereals (like cornflakes)  Dairy (if tolerated)  Ripe bananas, melon, or canned fruit without seeds  Long-Term Maintenance: High-Fiber Diet (Once Fully Recovered) This helps prevent future flare-ups by keeping the bowel movements soft and regular.  High-Fiber Foods Fruits: Apples (peeled), pears, berries, prunes  Vegetables: Broccoli, spinach, zucchini, peas  Whole grains: Oatmeal, brown rice, quinoa, whole wheat bread  Legumes: Lentils, chickpeas, black beans (start slowly to avoid gas)  Nuts & seeds: Only if tolerated (research no  longer restricts them, but if you feel they cause a flare do not eat them)

## 2024-02-01 NOTE — Progress Notes (Signed)
 "  Annual Wellness Visit   Patient Care Team: Perri Ronal PARAS, MD as PCP - General (Internal Medicine)  Visit Date: 02/15/2024   Chief Complaint  Patient presents with   Annual Exam   Medicare Wellness   Subjective:  Patient: Alexandra Taylor, Female DOB: Nov 09, 1950, 73 y.o. MRN: [id number] Vitals:   02/15/24 1100  BP: 130/80   Alexandra Taylor is a 73 y.o. Female who presents today for her Annual Wellness Visit. Patient has Hypothyroidism; Attention deficit disorder (ADD); Hypertension; Anxiety and depression; Encounter for counseling; Acquired trigger finger of left middle finger; Benign paroxysmal positional vertigo, bilateral; and Sensorineural hearing loss (SNHL), bilateral on their problem list.  Has area of macular papular erythema on her back. She says that it itches but she has been putting Jojoba Oil on it. Informed that if it persists she might have to see a dermatologist.   History of Hypertension treated with Ramipril  10 mg daily. Blood pressure is normal at 130/80.   History of Hypothyroidism treated with Levothyroxine  50 mcg daily before breakfast. In 2004 was diagnosed with Hyperthyroidism and underwent Hemithyroidectomy. 02/12/2024 TSH 0.42.    History of Anxiety/Depression treated with 150 mg Wellbutrin -XL daily and 20 mg Prozac  BID.    History of Back pain. Followed by Dr. Darlis at [facility]. On Monday she received an epidural steroid injection but has yet to see an effect. She has been having severe intractable pain since September. Pain managed with narcotic medication which aids sleep but not daytime pain.Pain persists despite multiple treatments.     Patient quit smoking in 1982.  She called [facility] about CT screening for smoking and they do not screen by CT unless it had been less than 15 years since she smoked.  She quit smoking in 1982 and therefore did not qualify through [facility].  We offered [facility] as an alternative but I am not sure she had screening there.  She brings this up once again today.  A chest x-ray was done with this visit.  Both lungs were clear and heart size was normal.  No active cardiopulmonary disease.   She saw rheumatologist in late 2019 regarding polyarthralgias and osteoarthritis. CCP was normal and it was felt she had osteoarthritis.    Saw Dr. Josefina April 2019 with neck pain evaluated with MRI and findings were consistent with osteoarthritis.   History of chronic benign neutropenia and peripheral smear has been reviewed in the past by pathology felt to be unremarkable.   Labs 02/12/2024 RBC 3.69, MCH 34.1, Absolute lymphocytes 837, Absolute Eosinophils 0, Blood glucose 101, Sodium 133, AST 36, CHOL 222, LDL 115, Otherwise WNL.   02/14/2023 mammogram No mammographic evidence of malignancy. Repeat in one year.    09/13/2023 Colonoscopy One 6 mm polyp in the distal ascending colon. Resected and retrieved. Pathology found to be polypoid fragment of benign colonic mucosa. Mild sigmoid diverticulosis. Internal hemorrhoids.The examined portion of the ileum was normal.The examination was otherwise normal on direct and retroflexion views. Repeat not recommended due to age.   Health Maintenance  Topic Date Due   Influenza Vaccine  09/28/2023   Medicare Annual Wellness (AWV)  02/12/2024   Mammogram  02/12/2024   Bone Density Scan  12/11/2025   Colonoscopy  09/12/2028   DTaP/Tdap/Td (3 - Td or Tdap) 10/13/2029   Pneumococcal Vaccine: 50+ Years  Completed   Hepatitis C Screening  Completed   Meningococcal B Vaccine  Aged  Out   COVID-19 Vaccine  Discontinued   Zoster Vaccines- Shingrix  Discontinued   Review of Systems  Constitutional:  Negative for fever and malaise/fatigue.  HENT:  Negative for congestion.   Eyes:  Negative for blurred vision.  Respiratory:  Negative for cough and shortness of breath.   Cardiovascular:  Negative for chest  pain, palpitations and leg swelling.  Gastrointestinal:  Negative for vomiting.  Musculoskeletal:  Negative for back pain.  Skin:  Negative for rash.  Neurological:  Negative for loss of consciousness and headaches.   Objective:  Vitals: body mass index is 22.5 kg/m. Today's Vitals   02/15/24 1100  BP: 130/80  Pulse: 67  SpO2: 99%  Weight: 127 lb (57.6 kg)  Height: 5' 3 (1.6 m)  PainSc: 5   PainLoc: Back   Physical Exam Vitals and nursing note reviewed.  Constitutional:      General: She is not in acute distress.    Appearance: Normal appearance. She is not ill-appearing or toxic-appearing.  HENT:     Head: Normocephalic and atraumatic.     Right Ear: Hearing, tympanic membrane, ear canal and external ear normal.     Left Ear: Hearing, tympanic membrane, ear canal and external ear normal.     Mouth/Throat:     Pharynx: Oropharynx is clear.  Eyes:     Extraocular Movements: Extraocular movements intact.     Pupils: Pupils are equal, round, and reactive to light.  Neck:     Thyroid : No thyroid  mass, thyromegaly or thyroid  tenderness.     Vascular: No carotid bruit.  Cardiovascular:     Rate and Rhythm: Normal rate and regular rhythm. No extrasystoles are present.    Pulses:          Dorsalis pedis pulses are 2+ on the right side and 2+ on the left side.     Heart sounds: Normal heart sounds. No murmur heard.    No friction rub. No gallop.  Pulmonary:     Effort: Pulmonary effort is normal.     Breath sounds: Normal breath sounds. No decreased breath sounds, wheezing, rhonchi or rales.  Chest:     Chest wall: No mass.  Abdominal:     Palpations: Abdomen is soft. There is no hepatomegaly, splenomegaly or mass.     Tenderness: There is no abdominal tenderness.     Hernia: No hernia is present.  Musculoskeletal:     Cervical back: Normal range of motion.     Right lower leg: No edema.     Left lower leg: No edema.  Lymphadenopathy:     Cervical: No cervical  adenopathy.     Upper Body:     Right upper body: No supraclavicular adenopathy.     Left upper body: No supraclavicular adenopathy.  Skin:    General: Skin is warm and dry.     Findings: Erythema and rash present. Rash is macular and papular.     Comments: Macular papular Erythema on back.   Neurological:     General: No focal deficit present.     Mental Status: She is alert and oriented to person, place, and time. Mental status is at baseline.     Sensory: Sensation is intact.     Motor: Motor function is intact. No weakness.     Deep Tendon Reflexes: Reflexes are normal and symmetric.  Psychiatric:        Attention and Perception: Attention normal.        Mood  and Affect: Mood normal.        Speech: Speech normal.        Behavior: Behavior normal.        Thought Content: Thought content normal.        Cognition and Memory: Cognition normal.        Judgment: Judgment normal.     Current Outpatient Medications  Medication Instructions   b complex vitamins tablet 1 tablet, Daily   benzonatate  (TESSALON ) 100 MG capsule TAKE 1 CAPSULE BY MOUTH THREE TIMES A DAY AS NEEDED   BIOTIN 5000 PO Take by mouth.   buPROPion  (WELLBUTRIN  XL) 150 mg, Oral, Daily   Diclofenac  Sodium CR 100 mg, Oral, Daily   dicyclomine  (BENTYL ) 20 MG tablet TAKE 1 TABLET (20 MG TOTAL) BY MOUTH EVERY 6 HOURS AS NEEDED FOR ABDOMINAL PAIN   FLUoxetine  (PROZAC ) 40 mg, Oral, Daily   fluticasone -salmeterol (ADVAIR HFA) 230-21 MCG/ACT inhaler INHALE 2 PUFFS INTO THE LUNGS TWICE A DAY   GLUCOSAMINE SULFATE PO Daily   HYDROcodone -acetaminophen  (NORCO/VICODIN) 5-325 MG tablet 1 tablet, Oral, Every 6 hours PRN   levothyroxine  (SYNTHROID ) 50 MCG tablet TAKE 1 TABLET BY MOUTH EVERY DAY BEFORE BREAKFAST   lidocaine  (LIDODERM ) 5 % 1 patch, Transdermal, Every 24 hours, Remove & Discard patch within 12 hours or as directed by MD   Milk Thistle 1000 MG CAPS Take by mouth. Pt states mg are 1300 mg   ondansetron  (ZOFRAN ) 4 mg,  Oral, Every 8 hours PRN   pantoprazole  (PROTONIX ) 40 mg, Oral, Daily   pantoprazole  (PROTONIX ) 40 mg, Oral, Daily   ramipril  (ALTACE ) 10 MG capsule Oral, Daily   Past Medical History:  Diagnosis Date   Hyperlipidemia    Hypertension    Osteopenia    Medical/Surgical History Narrative:  Allergic/Intolerant to:  Allergies  Allergen Reactions   Bee Venom Other (See Comments)    Past Surgical History:  Procedure Laterality Date   COLONOSCOPY  2025   hemithyroidectomy     Family History  Problem Relation Age of Onset   Hypertension Mother    Colon cancer Brother    Rectal cancer Neg Hx    Stomach cancer Neg Hx    Esophageal cancer Neg Hx    Family History Narrative: History: Mother with history of hypertension, stroke, MI fractured hip and anemia. Father died at age 64 with history of dementia and had arthritis of his hands.  Social History   Social History Narrative   Married. Non-smoker. Enjoys reading.    Most Recent Health Risks Assessment:   Most Recent Social Determinants of Health (Including Hx of Tobacco, Alcohol, and Drug Use) SDOH Screenings   Food Insecurity: No Food Insecurity (02/15/2024)  Housing: Low Risk (02/15/2024)  Transportation Needs: No Transportation Needs (02/12/2024)  Utilities: Not At Risk (02/12/2024)  Alcohol Screen: Low Risk (02/12/2024)  Depression (PHQ2-9): Low Risk (02/12/2023)  Financial Resource Strain: Low Risk (02/12/2024)  Physical Activity: Insufficiently Active (02/12/2024)  Social Connections: Moderately Integrated (02/12/2024)  Stress: Stress Concern Present (02/12/2024)  Tobacco Use: Medium Risk (02/15/2024)  Health Literacy: Adequate Health Literacy (02/12/2024)   Social History   Tobacco Use   Smoking status: Former    Current packs/day: 0.00    Average packs/day: 1 pack/day for 15.0 years (15.0 ttl pk-yrs)    Types: Cigarettes    Start date: 02/27/1969    Quit date: 02/28/1984    Years since quitting: 39.9   Smokeless  tobacco: Never  Vaping Use   Vaping  status: Never Used  Substance Use Topics   Alcohol use: Yes    Comment: Daily   Drug use: No   Most Recent Functional Status Assessment:    02/12/2024    1:07 PM  In your present state of health, do you have any difficulty performing the following activities:  Hearing? 1   Vision? 0      Manually entered by patient   Most Recent Fall Risk Assessment:    02/15/2024   10:55 AM  Fall Risk   Falls in the past year? 0  Number falls in past yr: 0  Injury with Fall? 0  Risk for fall due to : No Fall Risks  Follow up Falls prevention discussed;Education provided;Falls evaluation completed   Most Recent Anxiety/Depression Screenings:    02/12/2023    2:12 PM 11/02/2022   11:30 AM  PHQ 2/9 Scores  PHQ - 2 Score 0 0    Most Recent Cognitive Screening:    02/12/2023    2:08 PM  6CIT Screen  What Year? 0 points  What month? 0 points  What time? 0 points  Count back from 20 0 points  Months in reverse 0 points  Repeat phrase 0 points  Total Score 0 points    Results:  Studies Obtained And Personally Reviewed By Me:   02/14/2023 mammogram No mammographic evidence of malignancy. Repeat in one year.    09/13/2023 Colonoscopy One 6 mm polyp in the distal ascending colon. Resected and retrieved. Pathology found to be polypoid fragment of benign colonic mucosa. Mild sigmoid diverticulosis. Internal hemorrhoids.The examined portion of the ileum was normal.The examination was otherwise normal on direct and retroflexion views. Repeat not recommended due to age.   Labs:  CBC w/ Differential Lab Results  Component Value Date   WBC 5.4 02/12/2024   RBC 3.69 (L) 02/12/2024   HGB 12.6 02/12/2024   HCT 37.5 02/12/2024   PLT 280 02/12/2024   MCV 101.6 02/12/2024   MCH 34.1 (H) 02/12/2024   MCHC 33.6 02/12/2024   RDW 12.7 02/12/2024   MPV 9.3 02/12/2024   LYMPHSABS 1.0 01/26/2024   MONOABS 1.2 (H) 01/26/2024   BASOSABS 11 02/12/2024     Comprehensive Metabolic Panel Lab Results  Component Value Date   NA 133 (L) 02/12/2024   K 4.5 02/12/2024   CL 98 02/12/2024   CO2 27 02/12/2024   GLUCOSE 101 (H) 02/12/2024   BUN 10 02/12/2024   CREATININE 0.67 02/12/2024   CALCIUM 9.6 02/12/2024   PROT 6.1 02/12/2024   ALBUMIN 3.7 02/06/2024   AST 36 (H) 02/12/2024   ALT 29 02/12/2024   ALKPHOS 105 02/06/2024   BILITOT 0.5 02/12/2024   GFR 74.84 10/05/2023   EGFR 93 02/12/2024   GFRNONAA >60 01/26/2024   Lipid Panel  Lab Results  Component Value Date   CHOL 222 (H) 02/12/2024   HDL 100 02/12/2024   LDLCALC 115 (H) 02/12/2024   TRIG 25 02/12/2024    TSH Lab Results  Component Value Date   TSH 0.42 02/12/2024    Assessment & Plan:   Orders Placed This Encounter  Procedures   MM 3D SCREENING MAMMOGRAM BILATERAL BREAST    Reason for Exam (SYMPTOM  OR DIAGNOSIS REQUIRED):   health maintenance    Preferred imaging location?:   [facility]   Has area of macular papular erythema on her back. She says that it itches but she has been putting Jojoba Oil on it. Informed that if it  persists she might have to see a dermatologist.   Hypertension: treated with Ramipril  10 mg daily. Blood pressure is normal at 130/80.   Hypothyroidism: treated with Levothyroxine  50 mcg daily before breakfast. In 2004 was diagnosed with Hyperthyroidism and underwent Hemithyroidectomy. 02/12/2024 TSH 0.42.    Depression: treated with 150 mg Wellbutrin -XL daily and 20 mg Prozac  BID.   Hx abnormal LFTs due to ETOH use. No cirrhosis on liver bx April 2024. Resolved. Says she no longer drinks alcohol.  GERD treated with PPI . Having endoscopy per Dr. Charlanne in January 2026. Still having diarrhea. Colonoscopy July 2025 showed small sessile polyp diverticulosis and internal hemorrhoids  Back pain: Followed by Dr. Darlis at [facility]. On Monday she received an epidural steroid injection but has yet to see an effect. She  has been having severe intractable pain since September. Pain managed with narcotic medication which aids sleep but not daytime pain.Pain persists despite multiple treatments.     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 Does not have hemochromatosis.  02/14/2023 mammogram No mammographic evidence of malignancy. Repeat in one year.    09/13/2023 Colonoscopy One 6 mm polyp in the distal ascending colon. Resected and retrieved. Pathology found to be polypoid fragment of benign colonic mucosa. Mild sigmoid diverticulosis. Internal hemorrhoids.The examined portion of the ileum was normal.The examination was otherwise normal on direct and retroflexion views. Repeat not recommended due to age.   Health Maintenance: mammogram due   Mammogram ordered.      Annual Wellness Visit done today including the all of the following: Reviewed patient's Family Medical History Reviewed patient's SDOH and reviewed tobacco, alcohol, and drug use.  Reviewed and updated list of patient's medical providers Assessment of cognitive impairment was done Assessed patient's functional ability Established a written schedule for health screening services Health Risk Assessent Completed and Reviewed  Discussed health benefits of physical activity, and encouraged her to engage in regular exercise appropriate for her age and condition.   I,Makayla C Reid,acting as a scribe for Ronal JINNY Hailstone, MD.,have documented all relevant documentation on the behalf of Ronal JINNY Hailstone, MD,as directed by  Ronal JINNY Hailstone, MD while in the presence of Ronal JINNY Hailstone, MD.   I, Ronal JINNY Hailstone, MD, have reviewed all documentation for and agree with the above Annual Wellness Visit documentation.  Ronal JINNY Hailstone, MD Internal Medicine 02/15/2024 "

## 2024-02-01 NOTE — Telephone Encounter (Signed)
 My chart message sent as well

## 2024-02-01 NOTE — Telephone Encounter (Signed)
 Left message for patient to call back

## 2024-02-04 ENCOUNTER — Encounter: Payer: Self-pay | Admitting: Internal Medicine

## 2024-02-04 ENCOUNTER — Telehealth (INDEPENDENT_AMBULATORY_CARE_PROVIDER_SITE_OTHER): Admitting: Internal Medicine

## 2024-02-04 ENCOUNTER — Telehealth: Payer: Self-pay | Admitting: Internal Medicine

## 2024-02-04 VITALS — Ht 63.0 in | Wt 130.0 lb

## 2024-02-04 DIAGNOSIS — K5901 Slow transit constipation: Secondary | ICD-10-CM

## 2024-02-04 DIAGNOSIS — M5416 Radiculopathy, lumbar region: Secondary | ICD-10-CM

## 2024-02-04 DIAGNOSIS — E039 Hypothyroidism, unspecified: Secondary | ICD-10-CM | POA: Diagnosis not present

## 2024-02-04 DIAGNOSIS — F321 Major depressive disorder, single episode, moderate: Secondary | ICD-10-CM

## 2024-02-04 DIAGNOSIS — E890 Postprocedural hypothyroidism: Secondary | ICD-10-CM

## 2024-02-04 DIAGNOSIS — F32A Depression, unspecified: Secondary | ICD-10-CM | POA: Diagnosis not present

## 2024-02-04 DIAGNOSIS — M48062 Spinal stenosis, lumbar region with neurogenic claudication: Secondary | ICD-10-CM

## 2024-02-04 MED ORDER — ONDANSETRON HCL 4 MG PO TABS: 4.0000 mg | ORAL_TABLET | Freq: Three times a day (TID) | ORAL | 1 refills | Status: AC | PRN

## 2024-02-04 NOTE — Patient Instructions (Addendum)
 Making referral to Dr. Melba for consultation regarding depression symptoms. Zofran  refilled for nausea. Continue current meds. Limit alcohol consumption. Continue follow up with [facility] and [facility]. Continue Bupropion  and Prozac  for anxiety and depression.

## 2024-02-04 NOTE — Progress Notes (Addendum)
 "   Patient Care Team: Perri Ronal PARAS, MD as PCP - General (Internal Medicine)  I connected with Alexandra Taylor on 02/04/24 at 3:50 PM by video enabled telemedicine visit and verified that I am speaking with the correct person using two identifiers.   I discussed the limitations, risks, security and privacy concerns of performing an evaluation and management service by telemedicine and the availability of in-person appointments. I also discussed with the patient that there may be a patient responsible charge related to this service. The patient expressed understanding and agreed to proceed.   Other persons participating in the visit and their role in the encounter: Medical scribe, Nestora JAYSON Bathe  Patients location: Home  Providers location: Clinic   I provided 15 minutes of time during this encounter, and > 50% was spent counseling as documented under my assessment & plan. Time was spent with chart review, interviewing patient, medical decision making, refilling Zofran  for nausea as well as making referral for evaluation of depression symptoms.  Chief Complaint: Mental Health referral.    Subjective:    Patient ID: Alexandra Taylor , Female    DOB: May 15, 1950, 73 y.o.    MRN: [id number]   73 y.o. Female presents today virtually requesting a Mental Health referral. Patient has a past medical history of Hypertension, Hypothyroidism, Anxiety/depression .   She has been having severe intractable radicular pain since September. It has been unresponsive to narcotics, epidural steroid injections, Lyrica, Tylenol  and this pain has significantly impacted her quality of life. She would like to see a mental health professional.   She takes Bupropion  150 mg and Prozac  20 mg for anxiety/depression.   She occasionally has trouble sleeping, denies having trouble concentrating.She is not considering harming herself.  She is at her home and I am at my office.    See note  from 03/31/21. Chart  review shows that she took Adderall for a number of years for attention deficit disorder, initially through Dr. Marylin, Psychiatrist, and then I refilled it after he retired. Dr. Marylin also had her on Prozac  for a number of years. In 2013, she tried to discontinue Prozac   but  according to paper chart records, had discontinuation symptoms.Therefore, Prozac  was continued as she could not tolerate a taper.  Wellbutrin   XL 150 mg was added in Feb. 2023. Was advised to follow up in a few weeks.  She called with vertigo symptoms on March 6,2023. She thought vertigo was  due to Wellbutrin  but in my experience, Wellbutrin  did not generally cause this symptom. Her symptoms were more compatible with benign paroxysmal vertigo. She was treated for a few days with Antivert  and vertigo resolved. She was continued on Wellbutrin  and Prozac .   She also  says that her colitis has been causing issues since end of November. She has GI appointment  Friday. She said that she has had a hard time eating and doesn't really have an appetite. She says that ondansetron  has been helping with nausea.   Extensive liver evaluation done by GI August through October 2025.Currently being followed by Dr. Charlanne with [facility]. Felt not to have cirrhosis. Felt to have iron deposition of liver.   Past Medical History:  Diagnosis Date   Hyperlipidemia    Hypertension    Osteopenia      Family History  Problem Relation Age of Onset   Hypertension Mother    Colon cancer Brother    Rectal cancer Neg Hx    Stomach  cancer Neg Hx    Esophageal cancer Neg Hx     Social History   Social History Narrative   Married. Non-smoker. Enjoys reading.       Review of Systems  All other systems reviewed and are negative.       Objective:   Vitals: Not taken by patient.. Visit was virtual   She is seen virtually and looks fatigued. Her speech is a bit slow. Speech is not slurred. She looks fatigued and affect is  flat.   Results:   Studies obtained and personally reviewed by me:   Labs:       Component Value Date/Time   NA 125 (L) 01/26/2024 1114   K 3.2 (L) 01/26/2024 1114   CL 91 (L) 01/26/2024 1114   CO2 20 (L) 01/26/2024 1114   GLUCOSE 116 (H) 01/26/2024 1114   BUN 5 (L) 01/26/2024 1114   CREATININE 0.75 01/26/2024 1114   CREATININE 0.91 03/15/2022 1437   CREATININE 0.96 10/11/2021 0918   CALCIUM 8.5 (L) 01/26/2024 1114   PROT 5.5 (L) 01/26/2024 1114   ALBUMIN 3.3 (L) 01/26/2024 1114   AST 37 01/26/2024 1114   AST 40 03/15/2022 1437   ALT 23 01/26/2024 1114   ALT 40 03/15/2022 1437   ALKPHOS 75 01/26/2024 1114   BILITOT 0.9 01/26/2024 1114   BILITOT 0.6 03/15/2022 1437   GFRNONAA >60 01/26/2024 1114   GFRNONAA >60 03/15/2022 1437   GFRNONAA 85 01/01/2020 1656   GFRAA 98 01/01/2020 1656     Lab Results  Component Value Date   WBC 9.9 01/26/2024   HGB 13.6 01/26/2024   HCT 38.2 01/26/2024   MCV 97.4 01/26/2024   PLT 281 01/26/2024    Lab Results  Component Value Date   CHOL 227 (H) 10/09/2023   HDL 125 10/09/2023   LDLCALC 92 10/09/2023   TRIG 35 10/09/2023   CHOLHDL 1.8 10/09/2023     Lab Results  Component Value Date   TSH 0.75 10/09/2023        Assessment & Plan:   Orders Placed This Encounter  Procedures   Ambulatory referral to Psychiatry    Referral Priority:   Urgent    Referral Type:   Psychiatric    Referral Reason:   Specialty Services Required    Referred to Provider:   Melba Alm CROME, MD    Requested Specialty:   Psychiatry    Number of Visits Requested:   1    Meds ordered this encounter  Medications   ondansetron  (ZOFRAN ) 4 MG tablet    Sig: Take 1 tablet (4 mg total) by mouth every 8 (eight) hours as needed for nausea or vomiting.    Dispense:  30 tablet    Refill:  1    Depression: She has been having severe intractable  lumbar pain since September. It has been unresponsive to narcotics, epidural steroid injections, lyrica,  tylenol  and it has significantly impacted her quality of life. She believes she is depressed and I concur with that. Has been on Prozac  and wellbutrin  for a number of years.  She would like to see a mental health professional. She takes Bupropion  150 mg and Prozac  20 mg for anxiety/depression. She occasionally has trouble sleeping, denies having trouble concentrating.Referral has been placed.    Constipation: She also  says that her colitis has been causing problems since end of November. She has GI appointment  Friday. She said that she has had a hard time eating  and doesn't really have an appetite. She says that ondansetron  has been helping with nausea.  She went to ED January 26, 2024 complaining of generalized abdominal pain and obstipation.CT of abdomen showed small amt of free fluid in abdomen and moderate stool throughout the colon. No acute diverticulitis. Constipation may be contributing to her symptoms. Needs to discuss management of constipation with GI.  Lumbosacral radiculopathy has been ongoing for a number of weeks and is likely contributing to depression.Has been seen at [facility]. Has upcoming visit December 10th.  Hypothyroidism treated with thyroid  replacement medication  Hx of migraine headaches -Ondansetron  4 mg prescribed at pt request for nausea. Likely related to taking pain med on empty stomach.  Plan: Follow up with GI soon. Prescribed odansetron for nausea. Referral placed to Psychiatry for depression and medication management for depression. Continue GI follow up. Continue follow up with [facility]. Limit alcohol consumption.  I,Makayla C Reid,acting as a scribe for Ronal JINNY Hailstone, MD.,have documented all relevant documentation on the behalf of Ronal JINNY Hailstone, MD,as directed by  Ronal JINNY Hailstone, MD while in the presence of Ronal JINNY Hailstone, MD.  I, Ronal JINNY Hailstone, MD, have reviewed all documentation for this visit. The documentation on 02/04/2024 for the  exam, diagnosis, procedures, and orders are all accurate and complete.   "

## 2024-02-06 ENCOUNTER — Ambulatory Visit: Payer: Self-pay | Admitting: Gastroenterology

## 2024-02-06 ENCOUNTER — Other Ambulatory Visit (INDEPENDENT_AMBULATORY_CARE_PROVIDER_SITE_OTHER)

## 2024-02-06 DIAGNOSIS — R7989 Other specified abnormal findings of blood chemistry: Secondary | ICD-10-CM

## 2024-02-06 LAB — HEPATIC FUNCTION PANEL
ALT: 29 U/L (ref 0–35)
AST: 35 U/L (ref 0–37)
Albumin: 3.7 g/dL (ref 3.5–5.2)
Alkaline Phosphatase: 105 U/L (ref 39–117)
Bilirubin, Direct: 0.1 mg/dL (ref 0.0–0.3)
Total Bilirubin: 0.4 mg/dL (ref 0.2–1.2)
Total Protein: 6 g/dL (ref 6.0–8.3)

## 2024-02-07 NOTE — Telephone Encounter (Signed)
 Patient returned call, requesting a call back. Please advise.

## 2024-02-07 NOTE — Telephone Encounter (Signed)
 done

## 2024-02-08 ENCOUNTER — Encounter: Payer: Self-pay | Admitting: Gastroenterology

## 2024-02-08 ENCOUNTER — Ambulatory Visit: Admitting: Gastroenterology

## 2024-02-08 VITALS — BP 140/70 | HR 72 | Ht 63.0 in | Wt 124.2 lb

## 2024-02-08 DIAGNOSIS — K219 Gastro-esophageal reflux disease without esophagitis: Secondary | ICD-10-CM | POA: Diagnosis not present

## 2024-02-08 DIAGNOSIS — R630 Anorexia: Secondary | ICD-10-CM | POA: Diagnosis not present

## 2024-02-08 DIAGNOSIS — R7989 Other specified abnormal findings of blood chemistry: Secondary | ICD-10-CM | POA: Diagnosis not present

## 2024-02-08 DIAGNOSIS — R634 Abnormal weight loss: Secondary | ICD-10-CM

## 2024-02-08 MED ORDER — PANTOPRAZOLE SODIUM 40 MG PO TBEC: 40.0000 mg | DELAYED_RELEASE_TABLET | Freq: Every day | ORAL | 6 refills | Status: AC

## 2024-02-08 NOTE — Progress Notes (Signed)
 Chief Complaint: FU  Referring Provider:  Perri Ronal PARAS, MD      ASSESSMENT AND PLAN;   #1. Abn Lfts d/t ETOH liver disease (Resolved). No fibrosis/cirrhosis on liver Bx 05/2022. Resolved after stopping ETOH.  #2. Decreased appetite, wt loss with GERD. No dysphagia.;  #3. Sciatica (on oxy)  Plan: -No ETOH as she has been doing. -Continue miralax -Minimize pain medications -Monitor wt. Pl let us  know if any any further wt loss -Protonix  40mg  po every day #30, 6RF -EGD for further evaluation.  Proceed with EGD. I have discussed the risks and benefits. The risks including rare risk of perforation, bleeding, missed UGI neoplasms, risks of anesthesia/sedation. Alternatives were given. Patient is aware and agrees to proceed. All the questions were answered. This will be scheduled in upcoming days. Consent forms were given for review.   HPI:    History of Present Illness Alexandra Taylor is a 73 y.o. female  Patient of Dr. Eda For follow-up  She has stopped drinking alcohol.  Her most recent LFTs as below on 02/06/2024 were all normal.  Seen in the ED 01/26/2024 for abdominal pain, constipation.  CT Abdo/pelvis showed nonspecific inflammation in the pelvis.  Significant stool burden.  She was given MiraLAX with good relief.  No melena or hematochezia.  Her most recent colonoscopy was - 08/2023 except for colonic diverticulosis.  Comes to GI clinic for follow-up visit.  She denies having any GI problems currently except for reflux, decreased appetite and weight loss  Diagnosed with sciatica and is currently on oxy.  That does cause constipation.  She has been taking MiraLAX with good relief.  No fever chills or night sweats.    Wt Readings from Last 3 Encounters:  02/08/24 124 lb 4 oz (56.4 kg)  02/04/24 130 lb (59 kg)  01/26/24 125 lb (56.7 kg)        Latest Ref Rng & Units 02/06/2024    2:42 PM 01/26/2024   11:14 AM 12/14/2023   12:04 PM  Hepatic  Function  Total Protein 6.0 - 8.3 g/dL 6.0  5.5  6.5   Albumin 3.5 - 5.2 g/dL 3.7  3.3  4.1   AST 0 - 37 U/L 35  37  23   ALT 0 - 35 U/L 29  23  22    Alk Phosphatase 39 - 117 U/L 105  75  128   Total Bilirubin 0.2 - 1.2 mg/dL 0.4  0.9  0.7   Bilirubin, Direct 0.0 - 0.3 mg/dL 0.1   0.2    Liver/GI WU: -Neg HFE gene -Liver Bx: 06/14/2022 Histiocytic portal and lobular inflammation suggestive of resolved  liver injury. Hepatocellular siderosis. No evidence of fibrosis.  Iron index <1 -Negative ASMA, AFP, acute hepatitis panel -Patient is not immune to hepatitis B -Ferritin 290 (January 2024) -Neg iron studies 06/2023  US  04/2023 No new focal lesion identified. Heterogeneous and overall mildly increased hepatic parenchymal echogenicity. This is nonspecific but can be seen in the setting of underlying hepatocellular disease.  10/24/2021 RUQ US  echogenic liver with nodular contour suggestive of chronic liver disease possible cirrhosis Labs 01/27/22 ferritin 353, iron 168 Labs 03/15/22 ferritin 290, iron 96 02/03/2022 MRI showed heterogeneous left signal liver NVC Quince compatible with iron deposition liver, no findings suggestive of cirrhosis, numerous nodular areas T2 signal and relative increased T1 early arterial phase favored to be iron deposition sparing repeat MRI liver 3 months using Eovist  contrast material to reassess these areas. Mild  fusiform dilatation of the CBD measures up to 8 mm. No signs of choledocholithiasis. Signs of iron deposition within the spleen are also identified.  CT AP 01/26/2024 IMPRESSION: 1. Nonspecific inflammatory change noted in the pelvis, as well as a small amount free fluid. Etiology of this is unclear. Moderate increased stool throughout the colon and there are several sigmoid colon diverticula, but no convincing diverticulitis or other colonic inflammatory process. 2. No other evidence of an acute abnormality.   Endoscopic history H/O:  Colon   09/13/2023 (FH CRC brother around 76) - One 6 mm polyp in the distal ascending colon, removed with a cold snare. Resected and retrieved. - Mild sigmoid diverticulosis. - Internal hemorrhoids. - Bx- Neg - Rpt colon not needed due to age  69/15/2019 colonoscopy Dr. Herschell sigmoid diverticulosis otherwise normal TI, repeat 5 years due to family history (11/2022)  Past Medical History:  Diagnosis Date   Hyperlipidemia    Hypertension    Osteopenia     Past Surgical History:  Procedure Laterality Date   COLONOSCOPY  2025   hemithyroidectomy      Family History  Problem Relation Age of Onset   Hypertension Mother    Colon cancer Brother    Rectal cancer Neg Hx    Stomach cancer Neg Hx    Esophageal cancer Neg Hx     Social History   Tobacco Use   Smoking status: Former    Current packs/day: 0.00    Average packs/day: 1 pack/day for 15.0 years (15.0 ttl pk-yrs)    Types: Cigarettes    Start date: 02/27/1969    Quit date: 02/28/1984    Years since quitting: 39.9   Smokeless tobacco: Never  Vaping Use   Vaping status: Never Used  Substance Use Topics   Alcohol use: Yes    Comment: Daily   Drug use: No    Current Outpatient Medications  Medication Sig Dispense Refill   b complex vitamins tablet Take 1 tablet by mouth daily.     benzonatate  (TESSALON ) 100 MG capsule TAKE 1 CAPSULE BY MOUTH THREE TIMES A DAY AS NEEDED 30 capsule 0   BIOTIN 5000 PO Take by mouth.     buPROPion  (WELLBUTRIN  XL) 150 MG 24 hr tablet TAKE 1 TABLET BY MOUTH EVERY DAY 90 tablet 1   Diclofenac  Sodium CR 100 MG 24 hr tablet Take 1 tablet (100 mg total) by mouth daily. 7 tablet 0   dicyclomine  (BENTYL ) 20 MG tablet Take 1 tablet (20 mg total) by mouth every 6 (six) hours as needed (abdominal pain). 60 tablet 0   FLUoxetine  (PROZAC ) 20 MG capsule TAKE 2 CAPSULES BY MOUTH EVERY DAY 180 capsule 1   fluticasone -salmeterol (ADVAIR HFA) 230-21 MCG/ACT inhaler INHALE 2 PUFFS INTO THE LUNGS TWICE A DAY 12 each 12    GLUCOSAMINE SULFATE PO Take by mouth daily.     HYDROcodone -acetaminophen  (NORCO/VICODIN) 5-325 MG tablet Take 1 tablet by mouth every 6 (six) hours as needed for moderate pain (pain score 4-6). 15 tablet 0   levothyroxine  (SYNTHROID ) 50 MCG tablet TAKE 1 TABLET BY MOUTH EVERY DAY BEFORE BREAKFAST 90 tablet 1   Milk Thistle 1000 MG CAPS Take by mouth. Pt states mg are 1300 mg     ondansetron  (ZOFRAN ) 4 MG tablet Take 1 tablet (4 mg total) by mouth every 8 (eight) hours as needed for nausea or vomiting. 30 tablet 1   pantoprazole  (PROTONIX ) 40 MG tablet Take 1 tablet (40 mg total) by  mouth daily. 90 tablet 0   ramipril  (ALTACE ) 10 MG capsule TAKE 1 CAPSULE BY MOUTH EVERY DAY 90 capsule 1   amoxicillin -clavulanate (AUGMENTIN ) 875-125 MG tablet Take 1 tablet by mouth every 12 (twelve) hours. (Patient not taking: Reported on 02/08/2024) 14 tablet 0   lidocaine  (LIDODERM ) 5 % Place 1 patch onto the skin daily. Remove & Discard patch within 12 hours or as directed by MD (Patient not taking: Reported on 02/08/2024) 30 patch 0   No current facility-administered medications for this visit.    Allergies  Allergen Reactions   Bee Venom Other (See Comments)    Review of Systems:  neg     Physical Exam:    BP (!) 140/70   Pulse 72   Ht 5' 3 (1.6 m)   Wt 124 lb 4 oz (56.4 kg)   BMI 22.01 kg/m  Wt Readings from Last 3 Encounters:  02/08/24 124 lb 4 oz (56.4 kg)  02/04/24 130 lb (59 kg)  01/26/24 125 lb (56.7 kg)   Constitutional:  Well-developed, in no acute distress. Psychiatric: Normal mood and affect. Behavior is normal. HEENT: Pupils normal.  Conjunctivae are normal. No scleral icterus. Cardiovascular: Normal rate, regular rhythm. No edema Pulmonary/chest: Effort normal and breath sounds normal. No wheezing, rales or rhonchi. Abdominal: Soft, nondistended. Nontender. Bowel sounds active throughout. There are no masses palpable. No hepatomegaly. Rectal: Deferred Neurological: Alert  and oriented to person place and time. Skin: Skin is warm and dry. No rashes noted.  Data Reviewed: I have personally reviewed following labs and imaging studies  CBC:    Latest Ref Rng & Units 01/26/2024   11:14 AM 10/05/2023    2:54 PM 07/16/2023    9:44 AM  CBC  WBC 4.0 - 10.5 K/uL 9.9  4.8  4.5   Hemoglobin 12.0 - 15.0 g/dL 86.3  86.5  86.2   Hematocrit 36.0 - 46.0 % 38.2  39.2  41.4   Platelets 150 - 400 K/uL 281  207    207.0  214.0     CMP:    Latest Ref Rng & Units 02/06/2024    2:42 PM 01/26/2024   11:14 AM 12/14/2023   12:04 PM  CMP  Glucose 70 - 99 mg/dL  883    BUN 8 - 23 mg/dL  5    Creatinine 9.55 - 1.00 mg/dL  9.24    Sodium 864 - 854 mmol/L  125    Potassium 3.5 - 5.1 mmol/L  3.2    Chloride 98 - 111 mmol/L  91    CO2 22 - 32 mmol/L  20    Calcium 8.9 - 10.3 mg/dL  8.5    Total Protein 6.0 - 8.3 g/dL 6.0  5.5  6.5   Total Bilirubin 0.2 - 1.2 mg/dL 0.4  0.9  0.7   Alkaline Phos 39 - 117 U/L 105  75  128   AST 0 - 37 U/L 35  37  23   ALT 0 - 35 U/L 29  23  22          Anselm Bring, MD 02/08/2024, 11:36 AM  Cc: Perri Ronal PARAS, MD

## 2024-02-08 NOTE — Patient Instructions (Addendum)
 _______________________________________________________  If your blood pressure at your visit was 140/90 or greater, please contact your primary care physician to follow up on this.  _______________________________________________________  If you are age 72 or older, your body mass index should be between 23-30. Your Body mass index is 22.01 kg/m. If this is out of the aforementioned range listed, please consider follow up with your Primary Care Provider.  If you are age 42 or younger, your body mass index should be between 19-25. Your Body mass index is 22.01 kg/m. If this is out of the aformentioned range listed, please consider follow up with your Primary Care Provider.   ________________________________________________________  The [facility] providers would like to encourage you to use MYCHART to communicate with providers for non-urgent requests or questions.  Due to long hold times on the telephone, sending your provider a message by [facility] may be a faster and more efficient way to get a response.  Please allow 48 business hours for a response.  Please remember that this is for non-urgent requests.  _______________________________________________________  [facility] is using a team-based approach to care.  Your team is made up of your doctor and two to three APPS. Our APPS (Nurse Practitioners and Physician Assistants) work with your physician to ensure care continuity for you. They are fully qualified to address your health concerns and develop a treatment plan. They communicate directly with your gastroenterologist to care for you. Seeing the Advanced Practice Practitioners on your physician's team can help you by facilitating care more promptly, often allowing for earlier appointments, access to diagnostic testing, procedures, and other specialty referrals.    Keep a weight diary to monitor weight. Continue Miralax. Continue with no alcohol  You have been scheduled for  an endoscopy. Please follow written instructions given to you at your visit today.  If you use inhalers (even only as needed), please bring them with you on the day of your procedure.  If you take any of the following medications, they will need to be adjusted prior to your procedure:   DO NOT TAKE 7 DAYS PRIOR TO TEST- Trulicity (dulaglutide) Ozempic, Wegovy (semaglutide) Mounjaro, Zepbound (tirzepatide) Bydureon Bcise (exanatide extended release)  DO NOT TAKE 1 DAY PRIOR TO YOUR TEST Rybelsus (semaglutide) Adlyxin (lixisenatide) Victoza (liraglutide) Byetta (exanatide) ___________________________________________________________________________  Thank you,  Dr. Lynnie Bring

## 2024-02-08 NOTE — Telephone Encounter (Signed)
 done

## 2024-02-10 ENCOUNTER — Other Ambulatory Visit: Payer: Self-pay | Admitting: Physician Assistant

## 2024-02-10 ENCOUNTER — Encounter: Payer: Self-pay | Admitting: Gastroenterology

## 2024-02-10 NOTE — Progress Notes (Signed)
 [name], MD, have reviewed all documentation for this visit. The documentation on 02/04/2024 for the exam, diagnosis, procedures, and orders are all accurate and complete.

## 2024-02-11 ENCOUNTER — Encounter: Payer: Self-pay | Admitting: Gastroenterology

## 2024-02-11 NOTE — Telephone Encounter (Signed)
 Done

## 2024-02-12 ENCOUNTER — Other Ambulatory Visit: Payer: PPO

## 2024-02-12 DIAGNOSIS — Z8639 Personal history of other endocrine, nutritional and metabolic disease: Secondary | ICD-10-CM

## 2024-02-12 DIAGNOSIS — E039 Hypothyroidism, unspecified: Secondary | ICD-10-CM

## 2024-02-12 DIAGNOSIS — Z0000 Encounter for general adult medical examination without abnormal findings: Secondary | ICD-10-CM

## 2024-02-12 DIAGNOSIS — Z1231 Encounter for screening mammogram for malignant neoplasm of breast: Secondary | ICD-10-CM

## 2024-02-12 DIAGNOSIS — I10 Essential (primary) hypertension: Secondary | ICD-10-CM

## 2024-02-13 ENCOUNTER — Encounter: Payer: Self-pay | Admitting: Gastroenterology

## 2024-02-13 LAB — CBC WITH DIFFERENTIAL/PLATELET
Absolute Lymphocytes: 837 {cells}/uL — ABNORMAL LOW (ref 850–3900)
Absolute Monocytes: 427 {cells}/uL (ref 200–950)
Basophils Absolute: 11 {cells}/uL (ref 0–200)
Basophils Relative: 0.2 %
Eosinophils Absolute: 0 {cells}/uL — ABNORMAL LOW (ref 15–500)
Eosinophils Relative: 0 %
HCT: 37.5 % (ref 35.9–46.0)
Hemoglobin: 12.6 g/dL (ref 11.7–15.5)
MCH: 34.1 pg — ABNORMAL HIGH (ref 27.0–33.0)
MCHC: 33.6 g/dL (ref 31.6–35.4)
MCV: 101.6 fL (ref 81.4–101.7)
MPV: 9.3 fL (ref 7.5–12.5)
Monocytes Relative: 7.9 %
Neutro Abs: 4126 {cells}/uL (ref 1500–7800)
Neutrophils Relative %: 76.4 %
Platelets: 280 Thousand/uL (ref 140–400)
RBC: 3.69 Million/uL — ABNORMAL LOW (ref 3.80–5.10)
RDW: 12.7 % (ref 11.0–15.0)
Total Lymphocyte: 15.5 %
WBC: 5.4 Thousand/uL (ref 3.8–10.8)

## 2024-02-13 LAB — COMPREHENSIVE METABOLIC PANEL WITH GFR
AG Ratio: 2.1 (calc) (ref 1.0–2.5)
ALT: 29 U/L (ref 6–29)
AST: 36 U/L — ABNORMAL HIGH (ref 10–35)
Albumin: 4.1 g/dL (ref 3.6–5.1)
Alkaline phosphatase (APISO): 106 U/L (ref 37–153)
BUN: 10 mg/dL (ref 7–25)
CO2: 27 mmol/L (ref 20–32)
Calcium: 9.6 mg/dL (ref 8.6–10.4)
Chloride: 98 mmol/L (ref 98–110)
Creat: 0.67 mg/dL (ref 0.60–1.00)
Globulin: 2 g/dL (ref 1.9–3.7)
Glucose, Bld: 101 mg/dL — ABNORMAL HIGH (ref 65–99)
Potassium: 4.5 mmol/L (ref 3.5–5.3)
Sodium: 133 mmol/L — ABNORMAL LOW (ref 135–146)
Total Bilirubin: 0.5 mg/dL (ref 0.2–1.2)
Total Protein: 6.1 g/dL (ref 6.1–8.1)
eGFR: 93 mL/min/1.73m2 (ref >=60)

## 2024-02-13 LAB — LIPID PANEL
Cholesterol: 222 mg/dL — ABNORMAL HIGH (ref <200)
HDL: 100 mg/dL (ref >=50)
LDL Cholesterol (Calc): 115 mg/dL — ABNORMAL HIGH
Non-HDL Cholesterol (Calc): 122 mg/dL (ref <130)
Total CHOL/HDL Ratio: 2.2 (calc) (ref <5.0)
Triglycerides: 25 mg/dL (ref <150)

## 2024-02-13 LAB — TSH: TSH: 0.42 m[IU]/L (ref 0.40–4.50)

## 2024-02-13 NOTE — Telephone Encounter (Signed)
 CT AP as we discussed - mild nonspecific inflammatory changes noted in pelvis.  Unclear etiology.  Certainly could have been virus.   -Moderate increased stool throughout the colon and there are several sigmoid colon diverticula, but no convincing diverticulitis or other colonic inflammatory process. -No other evidence of an acute abnormality.  Not good for evaluation of stomach.  Since you are still having problems we will go ahead and do EGD. OK for January.  If you are having diarrhea, please stop MiraLAX.  Rest of the plan as per last clinic note.  More after EGD.  RG

## 2024-02-13 NOTE — Telephone Encounter (Signed)
 Done

## 2024-02-15 ENCOUNTER — Ambulatory Visit: Payer: PPO | Admitting: Internal Medicine

## 2024-02-15 ENCOUNTER — Encounter: Payer: Self-pay | Admitting: Internal Medicine

## 2024-02-15 VITALS — BP 130/80 | HR 67 | Ht 63.0 in | Wt 127.0 lb

## 2024-02-15 DIAGNOSIS — F32A Depression, unspecified: Secondary | ICD-10-CM

## 2024-02-15 DIAGNOSIS — H903 Sensorineural hearing loss, bilateral: Secondary | ICD-10-CM

## 2024-02-15 DIAGNOSIS — M549 Dorsalgia, unspecified: Secondary | ICD-10-CM | POA: Diagnosis not present

## 2024-02-15 DIAGNOSIS — R7989 Other specified abnormal findings of blood chemistry: Secondary | ICD-10-CM

## 2024-02-15 DIAGNOSIS — Z8659 Personal history of other mental and behavioral disorders: Secondary | ICD-10-CM

## 2024-02-15 DIAGNOSIS — Z0000 Encounter for general adult medical examination without abnormal findings: Secondary | ICD-10-CM

## 2024-02-15 DIAGNOSIS — M48062 Spinal stenosis, lumbar region with neurogenic claudication: Secondary | ICD-10-CM

## 2024-02-15 DIAGNOSIS — E039 Hypothyroidism, unspecified: Secondary | ICD-10-CM

## 2024-02-15 DIAGNOSIS — E890 Postprocedural hypothyroidism: Secondary | ICD-10-CM

## 2024-02-15 DIAGNOSIS — K219 Gastro-esophageal reflux disease without esophagitis: Secondary | ICD-10-CM

## 2024-02-15 DIAGNOSIS — Z23 Encounter for immunization: Secondary | ICD-10-CM

## 2024-02-15 DIAGNOSIS — I10 Essential (primary) hypertension: Secondary | ICD-10-CM

## 2024-02-15 DIAGNOSIS — G4733 Obstructive sleep apnea (adult) (pediatric): Secondary | ICD-10-CM

## 2024-02-15 DIAGNOSIS — M5416 Radiculopathy, lumbar region: Secondary | ICD-10-CM

## 2024-02-15 NOTE — Progress Notes (Signed)
 "  Chief Complaint  Patient presents with   Annual Exam   Medicare Wellness     Subjective:   Alexandra Taylor is a 73 y.o. female who presents for a Medicare Annual Wellness Visit.  Visit info / Clinical Intake: Medicare Wellness Visit Type:: Subsequent Annual Wellness Visit Persons participating in visit and providing information:: patient Medicare Wellness Visit Mode:: In-person (required for WTM) Interpreter Needed?: No Pre-visit prep was completed: yes AWV questionnaire completed by patient prior to visit?: yes Date:: 02/12/24 Living arrangements:: lives with spouse/significant other Patient's Overall Health Status Rating: good Typical amount of pain: some Does pain affect daily life?: no Are you currently prescribed opioids?: no  Dietary Habits and Nutritional Risks How many meals a day?: 3 Eats fruit and vegetables daily?: yes Most meals are obtained by: preparing own meals In the last 2 weeks, have you had any of the following?: none Diabetic:: no  Functional Status Activities of Daily Living (to include ambulation/medication): Independent Ambulation: Independent Medication Administration: Independent Home Management (perform basic housework or laundry): Independent Manage your own finances?: yes Primary transportation is: driving Concerns about vision?: no *vision screening is required for WTM* Concerns about hearing?: no  Fall Screening Falls in the past year?: 0 Number of falls in past year: 0 Was there an injury with Fall?: 0 Fall Risk Category Calculator: 0 Patient Fall Risk Level: Low Fall Risk  Fall Risk Patient at Risk for Falls Due to: No Fall Risks Fall risk Follow up: Falls prevention discussed; Education provided; Falls evaluation completed  Home and Transportation Safety: All rugs have non-skid backing?: N/A, no rugs All stairs or steps have railings?: N/A, no stairs Grab bars in the bathtub or shower?: (!) no Have non-skid surface in  bathtub or shower?: yes Good home lighting?: yes Regular seat belt use?: yes Hospital stays in the last year:: no  Cognitive Assessment Difficulty concentrating, remembering, or making decisions? : no Will 6CIT or Mini Cog be Completed: no 6CIT or Mini Cog Declined: patient alert, oriented, able to answer questions appropriately and recall recent events  Advance Directives (For Healthcare) Does Patient Have a Medical Advance Directive?: Yes Does patient want to make changes to medical advance directive?: No - Patient declined Type of Advance Directive: Living will; Healthcare Power of Attorney Copy of Healthcare Power of Attorney in Chart?: No - copy requested Copy of Living Will in Chart?: No - copy requested  Reviewed/Updated  Reviewed/Updated: Reviewed All (Medical, Surgical, Family, Medications, Allergies, Care Teams, Patient Goals)    Allergies (verified) Bee venom   Current Medications (verified) Outpatient Encounter Medications as of 02/15/2024  Medication Sig   b complex vitamins tablet Take 1 tablet by mouth daily.   benzonatate  (TESSALON ) 100 MG capsule TAKE 1 CAPSULE BY MOUTH THREE TIMES A DAY AS NEEDED   BIOTIN 5000 PO Take by mouth.   buPROPion  (WELLBUTRIN  XL) 150 MG 24 hr tablet TAKE 1 TABLET BY MOUTH EVERY DAY   Diclofenac  Sodium CR 100 MG 24 hr tablet Take 1 tablet (100 mg total) by mouth daily.   dicyclomine  (BENTYL ) 20 MG tablet TAKE 1 TABLET (20 MG TOTAL) BY MOUTH EVERY 6 HOURS AS NEEDED FOR ABDOMINAL PAIN   FLUoxetine  (PROZAC ) 20 MG capsule TAKE 2 CAPSULES BY MOUTH EVERY DAY   fluticasone -salmeterol (ADVAIR HFA) 230-21 MCG/ACT inhaler INHALE 2 PUFFS INTO THE LUNGS TWICE A DAY   GLUCOSAMINE SULFATE PO Take by mouth daily.   HYDROcodone -acetaminophen  (NORCO/VICODIN) 5-325 MG tablet Take 1 tablet by  mouth every 6 (six) hours as needed for moderate pain (pain score 4-6).   levothyroxine  (SYNTHROID ) 50 MCG tablet TAKE 1 TABLET BY MOUTH EVERY DAY BEFORE BREAKFAST    lidocaine  (LIDODERM ) 5 % Place 1 patch onto the skin daily. Remove & Discard patch within 12 hours or as directed by MD   Milk Thistle 1000 MG CAPS Take by mouth. Pt states mg are 1300 mg   ondansetron  (ZOFRAN ) 4 MG tablet Take 1 tablet (4 mg total) by mouth every 8 (eight) hours as needed for nausea or vomiting.   pantoprazole  (PROTONIX ) 40 MG tablet Take 1 tablet (40 mg total) by mouth daily.   pantoprazole  (PROTONIX ) 40 MG tablet Take 1 tablet (40 mg total) by mouth daily.   ramipril  (ALTACE ) 10 MG capsule TAKE 1 CAPSULE BY MOUTH EVERY DAY   [DISCONTINUED] amoxicillin -clavulanate (AUGMENTIN ) 875-125 MG tablet Take 1 tablet by mouth every 12 (twelve) hours. (Patient not taking: Reported on 02/08/2024)   [DISCONTINUED] ondansetron  (ZOFRAN -ODT) 4 MG disintegrating tablet Take 1 tablet (4 mg total) by mouth every 8 (eight) hours as needed for nausea or vomiting.   No facility-administered encounter medications on file as of 02/15/2024.    History: Past Medical History:  Diagnosis Date   Hyperlipidemia    Hypertension    Osteopenia    Past Surgical History:  Procedure Laterality Date   COLONOSCOPY  2025   hemithyroidectomy     Family History  Problem Relation Age of Onset   Hypertension Mother    Colon cancer Brother    Rectal cancer Neg Hx    Stomach cancer Neg Hx    Esophageal cancer Neg Hx    Social History   Occupational History   Not on file  Tobacco Use   Smoking status: Former    Current packs/day: 0.00    Average packs/day: 1 pack/day for 15.0 years (15.0 ttl pk-yrs)    Types: Cigarettes    Start date: 02/27/1969    Quit date: 02/28/1984    Years since quitting: 39.9   Smokeless tobacco: Never  Vaping Use   Vaping status: Never Used  Substance and Sexual Activity   Alcohol use: Yes    Comment: Daily   Drug use: No   Sexual activity: Yes   Tobacco Counseling Counseling given: Not Answered  SDOH Screenings   Food Insecurity: No Food Insecurity (02/15/2024)   Housing: Low Risk (02/15/2024)  Transportation Needs: No Transportation Needs (02/12/2024)  Utilities: Not At Risk (02/12/2024)  Alcohol Screen: Low Risk (02/12/2024)  Depression (PHQ2-9): Low Risk (02/15/2024)  Financial Resource Strain: Low Risk (02/12/2024)  Physical Activity: Insufficiently Active (02/12/2024)  Social Connections: Moderately Integrated (02/12/2024)  Stress: Stress Concern Present (02/12/2024)  Tobacco Use: Medium Risk (02/15/2024)  Health Literacy: Adequate Health Literacy (02/12/2024)   See flowsheets for full screening details  Depression Screen PHQ 2 & 9 Depression Scale- Over the past 2 weeks, how often have you been bothered by any of the following problems? Little interest or pleasure in doing things: 0 Feeling down, depressed, or hopeless (PHQ Adolescent also includes...irritable): 0 PHQ-2 Total Score: 0     Goals Addressed   None          Objective:    Today's Vitals   02/15/24 1100  BP: 130/80  Pulse: 67  SpO2: 99%  Weight: 127 lb (57.6 kg)  Height: 5' 3 (1.6 m)  PainSc: 5   PainLoc: Back   Body mass index is 22.5 kg/m.  Hearing/Vision screen  Vision Screening - Comments:: Patient states her eye exam is up to date [facility]  Immunizations and Health Maintenance Health Maintenance  Topic Date Due   Mammogram  02/12/2024   Medicare Annual Wellness (AWV)  02/14/2025   Bone Density Scan  12/11/2025   Colonoscopy  09/12/2028   DTaP/Tdap/Td (3 - Td or Tdap) 10/13/2029   Pneumococcal Vaccine: 50+ Years  Completed   Influenza Vaccine  Completed   Hepatitis C Screening  Completed   Meningococcal B Vaccine  Aged Out   COVID-19 Vaccine  Discontinued   Zoster Vaccines- Shingrix  Discontinued        Assessment/Plan:  This is a routine wellness examination for Halaina.  Patient Care Team: Perri Ronal PARAS, MD as PCP - General (Internal Medicine)  I have personally reviewed and noted the following in the patients chart:    Medical and social history Use of alcohol, tobacco or illicit drugs  Current medications and supplements including opioid prescriptions. Functional ability and status Nutritional status Physical activity Advanced directives List of other physicians Hospitalizations, surgeries, and ER visits in previous 12 months Vitals Screenings to include cognitive, depression, and falls Referrals and appointments  Orders Placed This Encounter  Procedures   MM 3D SCREENING MAMMOGRAM BILATERAL BREAST    HTA PF; : 02/12/2023@[facility]  No need- no issues- no hx of br ca- no implants- no reduction Spoke with pt/    Reason for Exam (SYMPTOM  OR DIAGNOSIS REQUIRED):   health maintenance    Preferred imaging location?:   [facility]   Flu vaccine HIGH DOSE PF(Fluzone Trivalent)   In addition, I have reviewed and discussed with patient certain preventive protocols, quality metrics, and best practice recommendations. A written personalized care plan for preventive services as well as general preventive health recommendations were provided to patient.   Kashay Cavenaugh Zelda, CMA   02/15/2024   No follow-ups on file.  After Visit Summary: (In Person-Printed) AVS printed and given to the patient  Nurse Notes: none "

## 2024-02-15 NOTE — Patient Instructions (Addendum)
 Alexandra Taylor,  Continue follow up with Gastroenterology and [facility]. Mammogram has been ordered. Return in one year or as needed.  Thank you for taking the time for your Medicare Wellness Visit. I appreciate your continued commitment to your health goals. Please review the care plan we discussed, and feel free to reach out if I can assist you further.  Please note that Annual Wellness Visits do not include a physical exam. Some assessments may be limited, especially if the visit was conducted virtually. If needed, we may recommend an in-person follow-up with your provider.  Ongoing Care Seeing your primary care provider every 3 to 6 months helps us  monitor your health and provide consistent, personalized care.   Referrals If a referral was made during today's visit and you haven't received any updates within two weeks, please contact the referred provider directly to check on the status.  Recommended Screenings:  Health Maintenance  Topic Date Due   Breast Cancer Screening  02/12/2024   Medicare Annual Wellness Visit  02/14/2025   Osteoporosis screening with Bone Density Scan  12/11/2025   Colon Cancer Screening  09/12/2028   DTaP/Tdap/Td vaccine (3 - Td or Tdap) 10/13/2029   Pneumococcal Vaccine for age over 9  Completed   Flu Shot  Completed   Hepatitis C Screening  Completed   Meningitis B Vaccine  Aged Out   COVID-19 Vaccine  Discontinued   Zoster (Shingles) Vaccine  Discontinued       02/15/2024   10:55 AM  Advanced Directives  Does Patient Have a Medical Advance Directive? Yes  Type of Advance Directive Living will;Healthcare Power of Attorney  Does patient want to make changes to medical advance directive? No - Patient declined  Copy of Healthcare Power of Attorney in Chart? No - copy requested    Vision: Annual vision screenings are recommended for early detection of glaucoma, cataracts, and diabetic retinopathy. These exams can also reveal signs of  chronic conditions such as diabetes and high blood pressure.  Dental: Annual dental screenings help detect early signs of oral cancer, gum disease, and other conditions linked to overall health, including heart disease and diabetes.  Please see the attached documents for additional preventive care recommendations.

## 2024-02-18 ENCOUNTER — Other Ambulatory Visit: Payer: Self-pay | Admitting: Internal Medicine

## 2024-02-18 DIAGNOSIS — Z8639 Personal history of other endocrine, nutritional and metabolic disease: Secondary | ICD-10-CM

## 2024-02-20 ENCOUNTER — Encounter: Payer: Self-pay | Admitting: Internal Medicine

## 2024-02-27 ENCOUNTER — Other Ambulatory Visit: Payer: Self-pay

## 2024-02-27 MED ORDER — PREGABALIN 25 MG PO CAPS: 25.0000 mg | ORAL_CAPSULE | Freq: Two times a day (BID) | ORAL | 2 refills | Status: AC

## 2024-02-27 MED ORDER — FLUOROURACIL 5 % EX CREA: TOPICAL_CREAM | CUTANEOUS | 1 refills | Status: AC

## 2024-02-27 MED ORDER — BUDESONIDE-FORMOTEROL FUMARATE 160-4.5 MCG/ACT IN AERO: 2.0000 | INHALATION_SPRAY | Freq: Two times a day (BID) | RESPIRATORY_TRACT | 3 refills | Status: AC

## 2024-03-05 ENCOUNTER — Encounter: Payer: Self-pay | Admitting: Internal Medicine

## 2024-03-06 ENCOUNTER — Other Ambulatory Visit: Payer: Self-pay

## 2024-03-06 ENCOUNTER — Encounter: Admitting: Gastroenterology

## 2024-03-06 DIAGNOSIS — Z8639 Personal history of other endocrine, nutritional and metabolic disease: Secondary | ICD-10-CM

## 2024-03-06 MED ORDER — LEVOTHYROXINE SODIUM 50 MCG PO TABS: ORAL_TABLET | ORAL | 1 refills | Status: AC

## 2024-03-18 ENCOUNTER — Other Ambulatory Visit: Payer: Self-pay

## 2024-03-18 DIAGNOSIS — K219 Gastro-esophageal reflux disease without esophagitis: Secondary | ICD-10-CM

## 2024-03-18 DIAGNOSIS — R630 Anorexia: Secondary | ICD-10-CM

## 2024-03-18 DIAGNOSIS — R634 Abnormal weight loss: Secondary | ICD-10-CM

## 2024-03-19 ENCOUNTER — Ambulatory Visit: Admitting: Gastroenterology

## 2024-03-27 ENCOUNTER — Encounter: Admitting: Gastroenterology

## 2024-04-02 ENCOUNTER — Other Ambulatory Visit: Payer: Self-pay | Admitting: Internal Medicine

## 2024-04-04 ENCOUNTER — Encounter: Admitting: Gastroenterology

## 2024-04-11 ENCOUNTER — Encounter: Admitting: Gastroenterology

## 2024-05-07 ENCOUNTER — Encounter: Admitting: Pulmonary Disease
